# Patient Record
Sex: Male | Born: 1940
Health system: Southern US, Community
[De-identification: ages and names within clinical notes are randomized; demographics above are authoritative.]

## PROBLEM LIST (undated history)

## (undated) DIAGNOSIS — J683 Other acute and subacute respiratory conditions due to chemicals, gases, fumes and vapors: Secondary | ICD-10-CM

## (undated) DIAGNOSIS — F419 Anxiety disorder, unspecified: Secondary | ICD-10-CM

## (undated) DIAGNOSIS — K5792 Diverticulitis of intestine, part unspecified, without perforation or abscess without bleeding: Secondary | ICD-10-CM

## (undated) DIAGNOSIS — J019 Acute sinusitis, unspecified: Secondary | ICD-10-CM

## (undated) DIAGNOSIS — E7800 Pure hypercholesterolemia, unspecified: Secondary | ICD-10-CM

## (undated) DIAGNOSIS — J343 Hypertrophy of nasal turbinates: Secondary | ICD-10-CM

## (undated) DIAGNOSIS — I10 Essential (primary) hypertension: Secondary | ICD-10-CM

## (undated) DIAGNOSIS — E039 Hypothyroidism, unspecified: Secondary | ICD-10-CM

## (undated) HISTORY — DX: Other acute and subacute respiratory conditions due to chemicals, gases, fumes and vapors: J68.3

## (undated) HISTORY — PX: NASAL SINUS SURGERY: SHX719

## (undated) HISTORY — PX: BACK SURGERY: SHX140

---

## 2002-03-18 ENCOUNTER — Other Ambulatory Visit: Admission: RE | Admit: 2002-03-18 | Discharge: 2002-03-18 | Payer: Self-pay | Admitting: Orthopaedic Surgery

## 2002-08-07 ENCOUNTER — Other Ambulatory Visit: Admission: RE | Admit: 2002-08-07 | Discharge: 2002-08-07 | Payer: Self-pay | Admitting: Dermatology

## 2004-06-27 ENCOUNTER — Encounter: Admission: RE | Admit: 2004-06-27 | Discharge: 2004-06-27 | Payer: Self-pay | Admitting: Otolaryngology

## 2004-07-07 ENCOUNTER — Ambulatory Visit (HOSPITAL_BASED_OUTPATIENT_CLINIC_OR_DEPARTMENT_OTHER): Admission: RE | Admit: 2004-07-07 | Discharge: 2004-07-07 | Payer: Self-pay | Admitting: Otolaryngology

## 2004-07-07 ENCOUNTER — Ambulatory Visit (HOSPITAL_COMMUNITY): Admission: RE | Admit: 2004-07-07 | Discharge: 2004-07-07 | Payer: Self-pay | Admitting: Otolaryngology

## 2004-07-07 ENCOUNTER — Encounter (INDEPENDENT_AMBULATORY_CARE_PROVIDER_SITE_OTHER): Payer: Self-pay | Admitting: *Deleted

## 2004-10-27 ENCOUNTER — Ambulatory Visit (HOSPITAL_COMMUNITY): Admission: RE | Admit: 2004-10-27 | Discharge: 2004-10-27 | Payer: Self-pay | Admitting: Ophthalmology

## 2009-11-08 ENCOUNTER — Ambulatory Visit: Payer: Self-pay | Admitting: Orthopedic Surgery

## 2009-11-08 DIAGNOSIS — M5137 Other intervertebral disc degeneration, lumbosacral region: Secondary | ICD-10-CM | POA: Insufficient documentation

## 2009-11-08 DIAGNOSIS — M25559 Pain in unspecified hip: Secondary | ICD-10-CM | POA: Insufficient documentation

## 2009-11-08 DIAGNOSIS — M51379 Other intervertebral disc degeneration, lumbosacral region without mention of lumbar back pain or lower extremity pain: Secondary | ICD-10-CM | POA: Insufficient documentation

## 2009-11-08 DIAGNOSIS — M549 Dorsalgia, unspecified: Secondary | ICD-10-CM | POA: Insufficient documentation

## 2009-11-08 DIAGNOSIS — M5430 Sciatica, unspecified side: Secondary | ICD-10-CM | POA: Insufficient documentation

## 2009-11-16 ENCOUNTER — Telehealth: Payer: Self-pay | Admitting: Orthopedic Surgery

## 2009-11-17 ENCOUNTER — Ambulatory Visit (HOSPITAL_COMMUNITY): Admission: RE | Admit: 2009-11-17 | Discharge: 2009-11-17 | Payer: Self-pay | Admitting: Orthopedic Surgery

## 2009-11-17 ENCOUNTER — Encounter: Payer: Self-pay | Admitting: Orthopedic Surgery

## 2009-11-18 ENCOUNTER — Telehealth: Payer: Self-pay | Admitting: Orthopedic Surgery

## 2009-11-18 ENCOUNTER — Encounter (INDEPENDENT_AMBULATORY_CARE_PROVIDER_SITE_OTHER): Payer: Self-pay | Admitting: *Deleted

## 2009-12-01 ENCOUNTER — Ambulatory Visit: Payer: Self-pay | Admitting: Orthopedic Surgery

## 2009-12-08 ENCOUNTER — Encounter: Payer: Self-pay | Admitting: Orthopedic Surgery

## 2009-12-13 ENCOUNTER — Telehealth: Payer: Self-pay | Admitting: Orthopedic Surgery

## 2009-12-16 ENCOUNTER — Encounter: Payer: Self-pay | Admitting: Orthopedic Surgery

## 2009-12-22 ENCOUNTER — Inpatient Hospital Stay (HOSPITAL_COMMUNITY): Admission: RE | Admit: 2009-12-22 | Discharge: 2009-12-23 | Payer: Self-pay | Admitting: Neurosurgery

## 2010-01-03 ENCOUNTER — Encounter: Payer: Self-pay | Admitting: Orthopedic Surgery

## 2010-02-02 ENCOUNTER — Encounter: Payer: Self-pay | Admitting: Orthopedic Surgery

## 2010-03-23 ENCOUNTER — Encounter: Payer: Self-pay | Admitting: Orthopedic Surgery

## 2010-07-14 ENCOUNTER — Encounter: Payer: Self-pay | Admitting: Orthopedic Surgery

## 2010-12-20 NOTE — Progress Notes (Signed)
Summary: wants predisone prescription refilled  Phone Note Call from Patient   Summary of Call: Cameron Smith (07/12/2041) requested a refill on gabapentin and predisone this morning, the gabapentin was ok'd but not the predisone.  Patient wants to know if you will ok the predison  - Uses [facility] His # [phone] or [phone] Initial call taken by: Jacklynn Ganong,  December 13, 2009 2:31 PM  Follow-up for Phone Call        ok Follow-up by: Fuller Canada MD,  December 13, 2009 2:37 PM  Additional Follow-up for Phone Call Additional follow up Details #1::        Gave ok to refill Predisone to [facility] Additional Follow-up by: Jacklynn Ganong,  December 13, 2009 3:18 PM

## 2010-12-20 NOTE — Letter (Signed)
Summary: [facility] office note Dr Dessie Coma office note Dr Channing Mutters   Imported By: Cammie Sickle 08/02/2010 11:27:57  _____________________________________________________________________  External Attachment:    Type:   Image     Comment:   External Document

## 2010-12-20 NOTE — Letter (Signed)
Summary: [facility] rpt Dr Nickola Major  [facility] rpt Dr Nickola Major   Imported By: Cammie Sickle 12/16/2009 09:07:11  _____________________________________________________________________  External Attachment:    Type:   Image     Comment:   External Document

## 2010-12-20 NOTE — Letter (Signed)
Summary: [facility] office note Dr Dessie Coma office note Dr Channing Mutters   Imported By: Cammie Sickle 04/13/2010 12:18:18  _____________________________________________________________________  External Attachment:    Type:   Image     Comment:   External Document

## 2010-12-20 NOTE — Assessment & Plan Note (Signed)
Summary: 3 WK RE-CK BACK/MEDICARE,MUT OF O/CAF   Visit Type:  Follow-up  CC:  back pain .  History of Present Illness: I saw Cameron Smith in the office today for a followup visit.  He is a 70 years old man with the complaint of:    DX: Spinal stenosis.  MRI:   IMPRESSION: Left posterolateral disc herniation at L4-5 with left lateral recess stenosis quite likely to compress the left L5 nerve root at least.  There is also facet degeneration and ligamentous hypertrophy contributing to the stenosis at this level.   Shallow broad-based protrusion at L3-4 slightly more prominent towards the left.  Mild facet and ligamentous hypertrophy.  Mild narrowing of the lateral recesses and foramina, not definitely compressive.  Stenosis is slightly more pronounced on the left.   Disc degeneration L5-S1 with a small central disc herniation that does not appear to cause distinct neural compression.  Treatment: Prednisone pack, Vicodin 5 and Neurontin 100mg  three times a day. He is on the 2nd round of Prednisone. Shot in left hip did not help. The meds help Some, some days are better than others. HIs left foot IS tingling, and he has ankle pain lateral calf pain that goes up to back. Needs refill on Vicodin.  Went for [facility] the Wednesday after Christmas, the injection did not help much. 12/08/09 he has appt for 2nd injection.   Has appt with Dr. Channing Mutters in [place], Lynnell Dike was the contact person at [facility].  Today, scheduled for: 3 week recheck on back. We have faxed referral for patient to have L spine [facility] and Neurosurgeon referral has been faxed also.  MRI has been reviewed. The findings are as noted. I have gone over the findings with the patient with a model and pictures and I have given him the [employer] handout recommend repeat epidural injection.  Refill of Vicodin, increased, Neurontin 2 tablets every 8 hours follow up with me after neurosurgical appointment. If surgery is not  recommended  Allergies: No Known Drug Allergies  Review of Systems General:  no red flags Are there any red flags:  Numbness, tingling ? Bowel/bladder problem?  Saddle anesthesisa? Fever, Night sweats? . Neuro:  Complains of numbness and weakness. MS:  See HPI.   Medications Added to Medication List This Visit: 1)  Vicodin 5-500 Mg Tabs (Hydrocodone-acetaminophen) .Marland Kitchen.. 1 by mouth q4 as needed  Other Orders: Est. Patient Level III ([address])  Patient Instructions: 1)  Go for 2nd esi  2)  take medication as ordered  3)  rest  4)  Symptoms may go away but still go for Neurosurgery appt. 5)  return after neurosurgery appointment if they advise that you should not have surgery  6)  If they advise you that you need surgery, no followup needed here for the back or leg pain Prescriptions: VICODIN 5-500 MG TABS (HYDROCODONE-ACETAMINOPHEN) 1 by mouth q4 as needed  #84 x 2   Entered and Authorized by:   Fuller Canada MD   Signed by:   Fuller Canada MD on 12/01/2009   Method used:   Print then Give to Patient   RxID:   [id number]

## 2010-12-20 NOTE — Consult Note (Signed)
Summary: [facility] consult Dr Dessie Coma consult Dr Channing Mutters   Imported By: Cammie Sickle 03/12/2010 12:55:50  _____________________________________________________________________  External Attachment:    Type:   Image     Comment:   External Document

## 2010-12-20 NOTE — Letter (Signed)
Summary: [facility] office note Dr Dessie Coma office note Dr Channing Mutters   Imported By: Cammie Sickle 03/02/2010 08:58:14  _____________________________________________________________________  External Attachment:    Type:   Image     Comment:   External Document

## 2010-12-23 NOTE — Letter (Signed)
Summary: [facility] Office note Dr Dessie Coma Office note Dr Channing Mutters   Imported By: Cammie Sickle 03/02/2010 09:03:47  _____________________________________________________________________  External Attachment:    Type:   Image     Comment:   External Document

## 2011-02-06 LAB — COMPREHENSIVE METABOLIC PANEL
ALT: 29 U/L (ref 0–53)
AST: 21 U/L (ref 0–37)
Albumin: 3.8 g/dL (ref 3.5–5.2)
CO2: 31 mEq/L (ref 19–32)
Calcium: 9.7 mg/dL (ref 8.4–10.5)
GFR calc non Af Amer: >60 mL/min (ref >60)
Sodium: 139 mEq/L (ref 135–145)
Total Bilirubin: 1.3 mg/dL — ABNORMAL HIGH (ref 0.3–1.2)
Total Protein: 6 g/dL (ref 6.0–8.3)

## 2011-02-06 LAB — DIFFERENTIAL
Lymphocytes Relative: 10 % — ABNORMAL LOW (ref 12–46)
Lymphs Abs: 1.3 10*3/uL (ref 0.7–4.0)
Monocytes Absolute: 1.4 10*3/uL — ABNORMAL HIGH (ref 0.1–1.0)
Monocytes Relative: 10 % (ref 3–12)
Neutro Abs: 10.3 10*3/uL — ABNORMAL HIGH (ref 1.7–7.7)

## 2011-02-06 LAB — CBC
Platelets: 196 10*3/uL (ref 150–400)
WBC: 13 10*3/uL — ABNORMAL HIGH (ref 4.0–10.5)

## 2011-02-06 LAB — URINALYSIS, ROUTINE W REFLEX MICROSCOPIC
Bilirubin Urine: NEGATIVE
Glucose, UA: NEGATIVE mg/dL
Hgb urine dipstick: NEGATIVE
Nitrite: NEGATIVE
Specific Gravity, Urine: 1.011 (ref 1.005–1.030)
pH: 7 (ref 5.0–8.0)

## 2011-02-06 LAB — PROTIME-INR
INR: 0.92 (ref 0.00–1.49)
Prothrombin Time: 12.3 seconds (ref 11.6–15.2)

## 2011-02-09 LAB — POTASSIUM: Potassium: 5.3 mEq/L — ABNORMAL HIGH (ref 3.5–5.1)

## 2011-04-07 NOTE — Op Note (Signed)
NAME:  Cameron Smith, Cameron Smith                       ACCOUNT NO.:  [id number]   MEDICAL RECORD NO.:  [id number]                   PATIENT TYPE:  AMB   LOCATION:  DSC                                  FACILITY:  [facility]   PHYSICIAN:  Suzanna Obey, M.D.                    DATE OF BIRTH:  [birth year]   DATE OF PROCEDURE:  DATE OF DISCHARGE:                                 OPERATIVE REPORT   PREOPERATIVE DIAGNOSIS:  Chronic sinusitis and deviated septum.   POSTOPERATIVE DIAGNOSIS:  Chronic sinusitis and deviated septum.   PROCEDURE:  Bilateral maxillary antrostomy, bilateral ethmoidectomy,  bilateral frontal sinusotomy, septoplasty, Instratek computer guidance.   ANESTHESIA:  General endotracheal tube anesthesia .   SURGEON:   ESTIMATED BLOOD LOSS:  Approximately 25 mL.   INDICATIONS FOR PROCEDURE:  This is a 70 year old who has had chronic  sinusitis problems.  He has decrease in sense of smell.  He has been treated  with  broad spectrum antibiotics  and failed to resolve with CT scan  findings of chronic sinusitis.  He has a severe deviation of his septum to  the right.  He was informed of risks and benefits of the procedure including  bleeding, infection, scarring of the sinuses, chronic crusting and drying,  CSF leak, change in sense of smell, blindness, and risk of the anesthetic.  Also he understands change in external appearance of the nose and septal  perforation is possible as well.  Consent was obtained.   DESCRIPTION OF PROCEDURE:  The patient taken to the operating room and  placed in the supine position.  After adequate general endotracheal tube  anesthesia , was prepped and draped in the usual sterile manner.  The  Instratek helmet was positioned and calibrated.  The oxymetazoline pledgets  were placed in the nose bilaterally in the septum.  Middle turbinates were  injected with 1% lidocaine with 1:100,000 epinephrine.  The right  hemitransfixion incision was performed raising  the mucoperichondrial and  ostial flap.  Cartilage was divided about 1.5 cm posterior to the caudal  strut and this cartilage was removed.  The cartilage was then elevated.  Flaps on both sides and the Jansen-Middleton forceps were used to remove the  deviated portion of the cartilage and bone which was severely deviated.  This opened up the nose nicely.  The middle turbinate on the right side  could then be visualized which it could not prior to this.  This turbinate  was injected with 1% width.  Then the sinus operation was begun on the left  side with an uncinectomy was performed.  The tissue was very polypoid and  microdebrided away.  Irving Copas was used to identify the maxillary area and  opened widely with backbiting forceps, straight biters and the  microdebrider.  The ethmoid was then opened.  Guidance used with the  [facility] and opened from posterior to anterior.  There  was thickened  polypoid material throughout the sinus cavity.  The nasal frontal duct was  opened with the upbiting 40 degree debrider and the nasal frontal duct was  identified and the polypoid material was removed.  Pledget was placed.  The  right side was repeated in the same fashion, again very thick polypoid  material was in the ethmoid and maxillary region.  The antrostomy was opened  widely.  The ethmoid was opened from posterior to anterior, removing all the  thick polypoid material.  Nasal frontal duct was opened as well with a 40  degree debrider.  Irving Copas was used.  The septum was then closed with  interrupted 4-0 chromic, a 4-0 plain gut  quilting stitch placed to the septum.  Kennedy packs soaked in Bacitracin  and placed into the nose bilaterally and secured loosely with the ties  across the columella.  Oral cavity and oropharynx suctioned of all blood and  debris and the patient was then awakened and brought to recovery in stable  condition.  Counts correct.                                                Suzanna Obey, M.D.    Cordelia Pen  D:  07/07/2004  T:  07/07/2004  Job:  [id number]   cc:   Dr. Juventino Slovak, [place]

## 2011-09-06 ENCOUNTER — Telehealth: Payer: Self-pay | Admitting: Internal Medicine

## 2011-09-06 NOTE — Telephone Encounter (Signed)
LMOM for Bonita Quin to return my call.

## 2011-09-06 NOTE — Telephone Encounter (Signed)
Dayvin Aber (wife of pt) called and wants to set up Mr [facility] colonoscopy per Dr Gerda Diss. I told her that I would have the triage nurse give them a call to have that arranged. Their number is [phone]

## 2011-09-07 NOTE — Telephone Encounter (Signed)
Called and spoke with pt's wife. Pt had colonoscopy in 09/2002 by Dr. Linna Darner in [place]. Doesn't think he had polyps. He's not having any problems now. Will check with [facility] for copy of that procedure.

## 2011-09-12 NOTE — Telephone Encounter (Signed)
LMOM for pt to call. 

## 2011-09-13 NOTE — Telephone Encounter (Signed)
LMOM to call.

## 2011-09-18 NOTE — Telephone Encounter (Signed)
Mailing letter to pt to call. OK to triage. Colonoscopy was done 11/2001. Will schedule if he calls back to be triaged.

## 2011-10-03 ENCOUNTER — Telehealth: Payer: Self-pay

## 2011-10-03 ENCOUNTER — Other Ambulatory Visit: Payer: Self-pay

## 2011-10-03 DIAGNOSIS — Z139 Encounter for screening, unspecified: Secondary | ICD-10-CM

## 2011-10-03 NOTE — Telephone Encounter (Signed)
OK to proceed with colonoscopy.

## 2011-10-03 NOTE — Telephone Encounter (Signed)
Gastroenterology Pre-Procedure Form   Request Date: 10/03/2011       Requesting Physician: Dr. Simone Curia     PATIENT INFORMATION:  Cameron Smith is a 70 y.o., male (DOB=09/15/1941).  PROCEDURE: Procedure(s) requested: colonoscopy Procedure Reason: screening for colon cancer  PATIENT REVIEW QUESTIONS: The patient reports the following:   1. Diabetes Melitis: no 2. Joint replacements in the past 12 months: no 3. Major health problems in the past 3 months: no 4. Has an artificial valve or MVP:no 5. Has been advised in past to take antibiotics in advance of a procedure like teeth cleaning: no}    MEDICATIONS & ALLERGIES:    Patient reports the following regarding taking any blood thinners:   Plavix? no Aspirin?yes  Coumadin?  no  Patient confirms/reports the following medications:  Current Outpatient Prescriptions  Medication Sig Dispense Refill  . aspirin 81 MG tablet Take 81 mg by mouth daily.        Marland Kitchen atorvastatin (LIPITOR) 20 MG tablet Take 20 mg by mouth daily.        . cloNIDine (CATAPRES) 0.2 MG tablet Take 0.2 mg by mouth 1 day or 1 dose.        . levothyroxine (SYNTHROID, LEVOTHROID) 88 MCG tablet Take 88 mcg by mouth daily.          Patient confirms/reports the following allergies:  No Known Allergies  Patient is appropriate to schedule for requested procedure(s): yes  AUTHORIZATION INFORMATION Primary Insurance:   ID #:  Group #:  Pre-Cert / Auth required: Pre-Cert / Auth #:   Secondary Insurance:  ID #:   Group #:  Pre-Cert / Auth required: Pre-Cert / Auth #:   No orders of the defined types were placed in this encounter.    SCHEDULE INFORMATION: Procedure has been scheduled as follows:  Date: 10/31/2011        Time: 10:30 AM  Location: [facility] Short Stay  This Gastroenterology Pre-Precedure Form is being routed to the following provider(s) for review: R. Roetta Sessions, MD

## 2011-10-04 NOTE — Telephone Encounter (Signed)
Rx and instructions mailed.  

## 2011-10-23 ENCOUNTER — Encounter (HOSPITAL_COMMUNITY): Payer: Self-pay | Admitting: Pharmacy Technician

## 2011-10-30 MED ORDER — SODIUM CHLORIDE 0.45 % IV SOLN
Freq: Once | INTRAVENOUS | Status: AC
  Administered 2011-10-31: 10:00:00 via INTRAVENOUS

## 2011-10-31 ENCOUNTER — Ambulatory Visit (HOSPITAL_COMMUNITY)
Admission: RE | Admit: 2011-10-31 | Discharge: 2011-10-31 | Disposition: A | Discharge status: Home / Self Care | Payer: Medicare Other | Source: Ambulatory Visit | Attending: Internal Medicine | Admitting: Internal Medicine

## 2011-10-31 ENCOUNTER — Encounter (HOSPITAL_COMMUNITY): Payer: Self-pay | Admitting: *Deleted

## 2011-10-31 ENCOUNTER — Encounter (HOSPITAL_COMMUNITY)
Admission: RE | Disposition: A | Discharge status: Home / Self Care | Payer: Self-pay | Source: Ambulatory Visit | Attending: Internal Medicine

## 2011-10-31 DIAGNOSIS — K5730 Diverticulosis of large intestine without perforation or abscess without bleeding: Secondary | ICD-10-CM

## 2011-10-31 DIAGNOSIS — Z1211 Encounter for screening for malignant neoplasm of colon: Secondary | ICD-10-CM

## 2011-10-31 DIAGNOSIS — Z139 Encounter for screening, unspecified: Secondary | ICD-10-CM

## 2011-10-31 HISTORY — DX: Anxiety disorder, unspecified: F41.9

## 2011-10-31 HISTORY — PX: COLONOSCOPY: SHX5424

## 2011-10-31 HISTORY — DX: Pure hypercholesterolemia, unspecified: E78.00

## 2011-10-31 HISTORY — DX: Essential (primary) hypertension: I10

## 2011-10-31 HISTORY — DX: Hypothyroidism, unspecified: E03.9

## 2011-10-31 SURGERY — COLONOSCOPY
Anesthesia: Moderate Sedation

## 2011-10-31 MED ORDER — MIDAZOLAM HCL 5 MG/5ML IJ SOLN
INTRAMUSCULAR | Status: DC
Start: 2011-10-31 — End: 2011-10-31
  Filled 2011-10-31: qty 10

## 2011-10-31 MED ORDER — MIDAZOLAM HCL 5 MG/5ML IJ SOLN
INTRAMUSCULAR | Status: DC | PRN
  Administered 2011-10-31: 2 mg via INTRAVENOUS
  Administered 2011-10-31 (×2): 1 mg via INTRAVENOUS

## 2011-10-31 MED ORDER — MEPERIDINE HCL 100 MG/ML IJ SOLN
INTRAMUSCULAR | Status: DC | PRN
  Administered 2011-10-31: 25 mg via INTRAVENOUS
  Administered 2011-10-31: 50 mg via INTRAVENOUS
  Administered 2011-10-31: 25 mg via INTRAVENOUS

## 2011-10-31 MED ORDER — MEPERIDINE HCL 100 MG/ML IJ SOLN
INTRAMUSCULAR | Status: AC
  Filled 2011-10-31: qty 2

## 2011-10-31 MED ORDER — STERILE WATER FOR IRRIGATION IR SOLN
Status: DC | PRN
  Administered 2011-10-31: 11:00:00

## 2011-10-31 NOTE — H&P (Signed)
Primary Care Physician:  Dr. Minette Headland   HPI:  Cameron Smith is a 70 y.o. male is here for a screening colonoscopy. Reportedly had a colonoscopy 10 years ago. Here for average risk screening. No bowel symptoms. No family history of polyps or colon cancer.  Past Medical History  Diagnosis Date  . Hypertension   . Hypercholesteremia   . Hypothyroidism   . Anxiety     Past Surgical History  Procedure Date  . Back surgery   . Nasal sinus surgery     Prior to Admission medications   Medication Sig Start Date End Date Taking? Authorizing Provider  aspirin 81 MG tablet Take 81 mg by mouth daily.     Historical Provider, MD  Aspirin-Salicylamide-Caffeine (BC HEADACHE POWDER PO) Take 1 packet by mouth daily as needed. Pain     Historical Provider, MD  atorvastatin (LIPITOR) 20 MG tablet Take 20 mg by mouth daily.      Historical Provider, MD  beta carotene 09811 UNIT capsule Take 25,000 Units by mouth daily.      Historical Provider, MD  cloNIDine (CATAPRES) 0.2 MG tablet Take 0.2 mg by mouth daily.     Historical Provider, MD  levothyroxine (SYNTHROID, LEVOTHROID) 88 MCG tablet Take 88 mcg by mouth daily.      Historical Provider, MD  lisinopril (PRINIVIL,ZESTRIL) 10 MG tablet Take 10 mg by mouth daily.      Historical Provider, MD  niacin (SLO-NIACIN) 500 MG tablet Take 500 mg by mouth daily.      Historical Provider, MD  vitamin E 400 UNIT capsule Take 400 Units by mouth daily.      Historical Provider, MD    Allergies as of 10/03/2011  . (No Known Allergies)    Family History  Problem Relation Age of Onset  . Colon cancer Neg Hx     History   Social History  . Marital Status: Married    Spouse Name: N/A    Number of Children: N/A  . Years of Education: N/A   Occupational History  . Not on file.   Social History Main Topics  . Smoking status: Former Smoker -- 3.0 packs/day for 20 years  . Smokeless tobacco: Not on file  . Alcohol Use: No  . Drug Use: No  .  Sexually Active:    Other Topics Concern  . Not on file   Social History Narrative  . No narrative on file    Review of Systems: See HPI, otherwise negative ROS  Physical Exam: BP 185/100  Pulse 84  Temp(Src) 97.7 F (36.5 C) (Oral)  Resp 24  Ht 5' 8.5" (1.74 m)  Wt 185 lb (83.915 kg)  BMI 27.72 kg/m2  SpO2 93% General:   Alert,  Well-developed, well-nourished, pleasant and cooperative in NAD Head:  Normocephalic and atraumatic. Eyes:  Sclera clear, no icterus.   Conjunctiva pink. Ears:  Normal auditory acuity. Nose:  No deformity, discharge,  or lesions. Mouth:  No deformity or lesions, dentition normal. Neck:  Supple; no masses or thyromegaly. Lungs:  Clear throughout to auscultation.   No wheezes, crackles, or rhonchi. No acute distress. Heart:  Regular rate and rhythm; no murmurs, clicks, rubs,  or gallops. Abdomen:  Nondistended positive bowel sounds soft nontender without appreciable mass or organomegaly  Msk:  Symmetrical without gross deformities. Normal posture. Pulses:  Normal pulses noted. Extremities:  Without clubbing or edema. Neurologic:  Alert and  oriented x4;  grossly normal neurologically. Skin:  Intact without significant lesions or rashes. Cervical Nodes:  No significant cervical adenopathy. Psych:  Alert and cooperative. Normal mood and affect.  Impression/Plan: Cameron Smith is now here to undergo a screening colonoscopy.  The risks, benefits, limitations, imponderables and alternatives regarding colonoscopy have been reviewed with the patient. Questions have been answered. All parties agreeable.

## 2011-10-31 NOTE — Op Note (Signed)
[facility] [address] [place], [place]  [address]  COLONOSCOPY PROCEDURE REPORT  PATIENT:  Cameron Smith, Cameron Smith  MR#:  [id number] BIRTHDATE:  Mar 07, 1941, 70 yrs. old  GENDER:  male ENDOSCOPIST:  R. Roetta Sessions, MD FACP [facility] REF. BY:           Dr. Minette Headland PROCEDURE DATE:  10/31/2011  PROCEDURE:    Screening colonoscopy  INDICATIONS:     Average risk screening -last exam approximately 10 years ago  INFORMED CONSENT:  The risks, benefits, alternatives and imponderables including but not limited to bleeding, perforation as well as the possibility of a missed lesion have been reviewed. The potential for biopsy, lesion removal, etc. have also been discussed.  Questions have been answered.  All parties agreeable. Please see the history and physical in the medical record for more information.  MEDICATIONS:   Versed 4 mg IV and Demerol 100 mg IV in divided doses  DESCRIPTION OF PROCEDURE:  After a digital rectal exam was performed, the EC-[id number] ([id number]) colonoscope was advanced from the anus through the rectum and colon to the area of the cecum, ileocecal valve and appendiceal orifice.  The cecum was deeply intubated.  These structures were well-seen and photographed for the record.  From the level of the cecum and ileocecal valve, the scope was slowly and cautiously withdrawn.  The mucosal surfaces were carefully surveyed utilizing scope tip deflection to facilitate fold flattening as needed.  The scope was pulled down into the rectum where a thorough examination including retroflexion was performed. <<PROCEDUREIMAGES>>  FINDINGS:  adequate preparation. Extensive left-sided diverticula; remainder of colonic and rectal mucosa appeared normal.  THERAPEUTIC / DIAGNOSTIC MANEUVERS PERFORMED:    none  COMPLICATIONS:   none  CECAL WITHDRAWAL TIME:     8 minutes  IMPRESSION:    Colonic diverticulosis.  RECOMMENDATIONS:      Consider one more screening colonoscopy  in 10 years if overall health permits  ______________________________ R. Roetta Sessions, MD Caleen Essex  CC:  Simone Curia, Md  n. eSIGNED:   R. Roetta Sessions at 10/31/2011 11:11 AM  Felton Clinton, [phone]

## 2011-11-08 ENCOUNTER — Encounter (HOSPITAL_COMMUNITY): Payer: Self-pay | Admitting: Internal Medicine

## 2012-01-03 DIAGNOSIS — E782 Mixed hyperlipidemia: Secondary | ICD-10-CM | POA: Diagnosis not present

## 2012-01-03 DIAGNOSIS — E119 Type 2 diabetes mellitus without complications: Secondary | ICD-10-CM | POA: Diagnosis not present

## 2012-01-03 DIAGNOSIS — Z125 Encounter for screening for malignant neoplasm of prostate: Secondary | ICD-10-CM | POA: Diagnosis not present

## 2012-01-03 DIAGNOSIS — Z79899 Other long term (current) drug therapy: Secondary | ICD-10-CM | POA: Diagnosis not present

## 2012-01-11 DIAGNOSIS — I10 Essential (primary) hypertension: Secondary | ICD-10-CM | POA: Diagnosis not present

## 2012-01-11 DIAGNOSIS — E119 Type 2 diabetes mellitus without complications: Secondary | ICD-10-CM | POA: Diagnosis not present

## 2012-01-11 DIAGNOSIS — E785 Hyperlipidemia, unspecified: Secondary | ICD-10-CM | POA: Diagnosis not present

## 2012-07-12 DIAGNOSIS — Z79899 Other long term (current) drug therapy: Secondary | ICD-10-CM | POA: Diagnosis not present

## 2012-07-12 DIAGNOSIS — R7301 Impaired fasting glucose: Secondary | ICD-10-CM | POA: Diagnosis not present

## 2012-07-12 DIAGNOSIS — E782 Mixed hyperlipidemia: Secondary | ICD-10-CM | POA: Diagnosis not present

## 2012-07-24 DIAGNOSIS — I10 Essential (primary) hypertension: Secondary | ICD-10-CM | POA: Diagnosis not present

## 2012-07-24 DIAGNOSIS — E785 Hyperlipidemia, unspecified: Secondary | ICD-10-CM | POA: Diagnosis not present

## 2012-07-24 DIAGNOSIS — E119 Type 2 diabetes mellitus without complications: Secondary | ICD-10-CM | POA: Diagnosis not present

## 2012-08-21 ENCOUNTER — Other Ambulatory Visit: Payer: Self-pay | Admitting: Dermatology

## 2012-08-21 DIAGNOSIS — D485 Neoplasm of uncertain behavior of skin: Secondary | ICD-10-CM | POA: Diagnosis not present

## 2012-08-21 DIAGNOSIS — L570 Actinic keratosis: Secondary | ICD-10-CM | POA: Diagnosis not present

## 2012-08-28 DIAGNOSIS — Z23 Encounter for immunization: Secondary | ICD-10-CM | POA: Diagnosis not present

## 2012-09-09 DIAGNOSIS — Z23 Encounter for immunization: Secondary | ICD-10-CM | POA: Diagnosis not present

## 2012-09-09 DIAGNOSIS — Z0000 Encounter for general adult medical examination without abnormal findings: Secondary | ICD-10-CM | POA: Diagnosis not present

## 2012-12-30 DIAGNOSIS — I10 Essential (primary) hypertension: Secondary | ICD-10-CM | POA: Diagnosis not present

## 2012-12-30 DIAGNOSIS — M719 Bursopathy, unspecified: Secondary | ICD-10-CM | POA: Diagnosis not present

## 2012-12-30 DIAGNOSIS — M25519 Pain in unspecified shoulder: Secondary | ICD-10-CM | POA: Diagnosis not present

## 2012-12-30 DIAGNOSIS — M67919 Unspecified disorder of synovium and tendon, unspecified shoulder: Secondary | ICD-10-CM | POA: Diagnosis not present

## 2013-01-07 DIAGNOSIS — Z79899 Other long term (current) drug therapy: Secondary | ICD-10-CM | POA: Diagnosis not present

## 2013-01-07 DIAGNOSIS — E119 Type 2 diabetes mellitus without complications: Secondary | ICD-10-CM | POA: Diagnosis not present

## 2013-01-07 DIAGNOSIS — E039 Hypothyroidism, unspecified: Secondary | ICD-10-CM | POA: Diagnosis not present

## 2013-01-07 DIAGNOSIS — E785 Hyperlipidemia, unspecified: Secondary | ICD-10-CM | POA: Diagnosis not present

## 2013-01-07 DIAGNOSIS — Z125 Encounter for screening for malignant neoplasm of prostate: Secondary | ICD-10-CM | POA: Diagnosis not present

## 2013-01-07 DIAGNOSIS — I10 Essential (primary) hypertension: Secondary | ICD-10-CM | POA: Diagnosis not present

## 2013-06-12 ENCOUNTER — Telehealth: Payer: Self-pay | Admitting: Family Medicine

## 2013-06-12 DIAGNOSIS — E782 Mixed hyperlipidemia: Secondary | ICD-10-CM

## 2013-06-12 DIAGNOSIS — Z79899 Other long term (current) drug therapy: Secondary | ICD-10-CM

## 2013-06-12 NOTE — Telephone Encounter (Signed)
Lip liv plus ov

## 2013-06-12 NOTE — Telephone Encounter (Signed)
Pt needs BW papers please

## 2013-06-13 DIAGNOSIS — E782 Mixed hyperlipidemia: Secondary | ICD-10-CM | POA: Diagnosis not present

## 2013-06-13 DIAGNOSIS — Z79899 Other long term (current) drug therapy: Secondary | ICD-10-CM | POA: Diagnosis not present

## 2013-06-13 NOTE — Telephone Encounter (Signed)
bw papers ready for pick up. Pt's wife notified

## 2013-06-14 ENCOUNTER — Encounter: Payer: Self-pay | Admitting: *Deleted

## 2013-07-01 DIAGNOSIS — E782 Mixed hyperlipidemia: Secondary | ICD-10-CM | POA: Diagnosis not present

## 2013-07-01 DIAGNOSIS — Z79899 Other long term (current) drug therapy: Secondary | ICD-10-CM | POA: Diagnosis not present

## 2013-07-01 LAB — LIPID PANEL
Cholesterol: 172 mg/dL (ref 0–200)
Total CHOL/HDL Ratio: 4.2 Ratio
Triglycerides: 97 mg/dL (ref <150)
VLDL: 19 mg/dL (ref 0–40)

## 2013-07-01 LAB — HEPATIC FUNCTION PANEL
ALT: 18 U/L (ref 0–53)
Total Protein: 6.3 g/dL (ref 6.0–8.3)

## 2013-07-02 DIAGNOSIS — L570 Actinic keratosis: Secondary | ICD-10-CM | POA: Diagnosis not present

## 2013-07-02 DIAGNOSIS — D239 Other benign neoplasm of skin, unspecified: Secondary | ICD-10-CM | POA: Diagnosis not present

## 2013-07-08 ENCOUNTER — Ambulatory Visit (INDEPENDENT_AMBULATORY_CARE_PROVIDER_SITE_OTHER): Payer: Medicare Other | Admitting: Family Medicine

## 2013-07-08 ENCOUNTER — Encounter: Payer: Self-pay | Admitting: Family Medicine

## 2013-07-08 VITALS — BP 138/90 | Ht 68.0 in | Wt 196.6 lb

## 2013-07-08 DIAGNOSIS — R7301 Impaired fasting glucose: Secondary | ICD-10-CM

## 2013-07-08 DIAGNOSIS — E782 Mixed hyperlipidemia: Secondary | ICD-10-CM | POA: Insufficient documentation

## 2013-07-08 DIAGNOSIS — E039 Hypothyroidism, unspecified: Secondary | ICD-10-CM

## 2013-07-08 DIAGNOSIS — I10 Essential (primary) hypertension: Secondary | ICD-10-CM

## 2013-07-08 DIAGNOSIS — E785 Hyperlipidemia, unspecified: Secondary | ICD-10-CM | POA: Insufficient documentation

## 2013-07-08 MED ORDER — LISINOPRIL 20 MG PO TABS: 20.0000 mg | ORAL_TABLET | Freq: Every day | ORAL | Status: DC

## 2013-07-08 MED ORDER — LEVOTHYROXINE SODIUM 88 MCG PO TABS: 88.0000 ug | ORAL_TABLET | Freq: Every day | ORAL | Status: DC

## 2013-07-08 MED ORDER — ATORVASTATIN CALCIUM 20 MG PO TABS: 20.0000 mg | ORAL_TABLET | Freq: Every day | ORAL | Status: DC

## 2013-07-08 NOTE — Addendum Note (Signed)
Addended by: Dereck Ligas on: 07/08/2013 09:59 AM   Modules accepted: Orders

## 2013-07-08 NOTE — Progress Notes (Signed)
  Subjective:    Patient ID: Cameron Smith, male    DOB: 1941-03-31, 72 y.o.   MRN: [id number]  HPI Results for orders placed in visit on 06/12/13  LIPID PANEL      Result Value Range   Cholesterol 172  0 - 200 mg/dL   Triglycerides 97  <409 mg/dL   HDL 41  >81 mg/dL   Total CHOL/HDL Ratio 4.2     VLDL 19  0 - 40 mg/dL   LDL Cholesterol 191 (*) 0 - 99 mg/dL  HEPATIC FUNCTION PANEL      Result Value Range   Total Bilirubin 0.7  0.3 - 1.2 mg/dL   Bilirubin, Direct 0.1  0.0 - 0.3 mg/dL   Indirect Bilirubin 0.6  0.0 - 0.9 mg/dL   Alkaline Phosphatase 53  39 - 117 U/L   AST 16  0 - 37 U/L   ALT 18  0 - 53 U/L   Total Protein 6.3  6.0 - 8.3 g/dL   Albumin 4.3  3.5 - 5.2 g/dL   Patient claims compliance with all of his medications. Review of his medicines reveals that he is taking both over-the-counter niacin and vitamin E. supplementation.  Compliant with his cholesterol medicine. Tolerating it well. Mostly watching his diet.  Compliant with thyroid medicine. No symptoms of hypo-or hyperthyroidism.  Compliant with blood pressure medicine. No headache no chest pain. Trying to watch his salt intake.   Review of Systems ROS otherwise negative    Objective:   Physical Exam  Alert no acute distress. Lungs clear. Heart regular rate and rhythm. HEENT normal. Vitals reviewed. Ankles without edema.      Assessment & Plan:  Impression 1 hypertension good control discussed. #2 hyperlipidemia blood work shows good control discussed. #3 chronic over-the-counter supplements. Patient does not realize it, but he is on a couple supplements which have been shown to be negative and there overall affect discussed at length. #4 history of glucose intolerance discussed. Plan encouraged to stop niacin, and stop vitamin E. Maintain other medicines. Diet exercise discussed. Medications called in. Recheck in 6 months. WSL

## 2013-09-11 DIAGNOSIS — Z23 Encounter for immunization: Secondary | ICD-10-CM | POA: Diagnosis not present

## 2013-11-22 ENCOUNTER — Emergency Department (HOSPITAL_COMMUNITY)
Admission: EM | Admit: 2013-11-22 | Discharge: 2013-11-22 | Disposition: A | Discharge status: Home / Self Care | Payer: Medicare Other | Attending: Emergency Medicine | Admitting: Emergency Medicine

## 2013-11-22 ENCOUNTER — Emergency Department (HOSPITAL_COMMUNITY): Payer: Medicare Other

## 2013-11-22 ENCOUNTER — Encounter (HOSPITAL_COMMUNITY): Payer: Self-pay | Admitting: Emergency Medicine

## 2013-11-22 DIAGNOSIS — Z9889 Other specified postprocedural states: Secondary | ICD-10-CM | POA: Diagnosis not present

## 2013-11-22 DIAGNOSIS — K5732 Diverticulitis of large intestine without perforation or abscess without bleeding: Secondary | ICD-10-CM | POA: Insufficient documentation

## 2013-11-22 DIAGNOSIS — E7800 Pure hypercholesterolemia, unspecified: Secondary | ICD-10-CM | POA: Insufficient documentation

## 2013-11-22 DIAGNOSIS — R1032 Left lower quadrant pain: Secondary | ICD-10-CM | POA: Diagnosis not present

## 2013-11-22 DIAGNOSIS — E039 Hypothyroidism, unspecified: Secondary | ICD-10-CM | POA: Insufficient documentation

## 2013-11-22 DIAGNOSIS — I10 Essential (primary) hypertension: Secondary | ICD-10-CM | POA: Insufficient documentation

## 2013-11-22 DIAGNOSIS — Z7982 Long term (current) use of aspirin: Secondary | ICD-10-CM | POA: Insufficient documentation

## 2013-11-22 DIAGNOSIS — Z87891 Personal history of nicotine dependence: Secondary | ICD-10-CM | POA: Insufficient documentation

## 2013-11-22 DIAGNOSIS — F411 Generalized anxiety disorder: Secondary | ICD-10-CM | POA: Diagnosis not present

## 2013-11-22 DIAGNOSIS — Z792 Long term (current) use of antibiotics: Secondary | ICD-10-CM | POA: Insufficient documentation

## 2013-11-22 DIAGNOSIS — Z79899 Other long term (current) drug therapy: Secondary | ICD-10-CM | POA: Insufficient documentation

## 2013-11-22 DIAGNOSIS — K5792 Diverticulitis of intestine, part unspecified, without perforation or abscess without bleeding: Secondary | ICD-10-CM

## 2013-11-22 DIAGNOSIS — J45909 Unspecified asthma, uncomplicated: Secondary | ICD-10-CM | POA: Diagnosis not present

## 2013-11-22 LAB — CBC WITH DIFFERENTIAL/PLATELET
BASOS PCT: 0 % (ref 0–1)
Basophils Absolute: 0 10*3/uL (ref 0.0–0.1)
EOS PCT: 3 % (ref 0–5)
Eosinophils Absolute: 0.3 10*3/uL (ref 0.0–0.7)
HEMATOCRIT: 45.1 % (ref 39.0–52.0)
HEMOGLOBIN: 15.7 g/dL (ref 13.0–17.0)
LYMPHS PCT: 15 % (ref 12–46)
Lymphs Abs: 1.5 10*3/uL (ref 0.7–4.0)
MCH: 33.2 pg (ref 26.0–34.0)
MCHC: 34.8 g/dL (ref 30.0–36.0)
MCV: 95.3 fL (ref 78.0–100.0)
MONO ABS: 1.2 10*3/uL — AB (ref 0.1–1.0)
MONOS PCT: 12 % (ref 3–12)
NEUTROS ABS: 7.2 10*3/uL (ref 1.7–7.7)
Neutrophils Relative %: 70 % (ref 43–77)
Platelets: 222 10*3/uL (ref 150–400)
RBC: 4.73 MIL/uL (ref 4.22–5.81)
RDW: 12.7 % (ref 11.5–15.5)
WBC: 10.2 10*3/uL (ref 4.0–10.5)

## 2013-11-22 LAB — URINALYSIS, ROUTINE W REFLEX MICROSCOPIC
BILIRUBIN URINE: NEGATIVE
GLUCOSE, UA: NEGATIVE mg/dL
KETONES UR: NEGATIVE mg/dL
Leukocytes, UA: NEGATIVE
Nitrite: NEGATIVE
PH: 6 (ref 5.0–8.0)
Protein, ur: NEGATIVE mg/dL
Specific Gravity, Urine: <1.005 — ABNORMAL LOW (ref 1.005–1.030)
Urobilinogen, UA: 0.2 mg/dL (ref 0.0–1.0)

## 2013-11-22 LAB — URINE MICROSCOPIC-ADD ON

## 2013-11-22 LAB — BASIC METABOLIC PANEL
BUN: 13 mg/dL (ref 6–23)
CALCIUM: 9.6 mg/dL (ref 8.4–10.5)
CHLORIDE: 102 meq/L (ref 96–112)
CO2: 26 meq/L (ref 19–32)
CREATININE: 0.73 mg/dL (ref 0.50–1.35)
GFR calc Af Amer: >90 mL/min (ref >90)
GFR calc non Af Amer: >90 mL/min (ref >90)
Glucose, Bld: 129 mg/dL — ABNORMAL HIGH (ref 70–99)
Potassium: 4.6 mEq/L (ref 3.7–5.3)
Sodium: 140 mEq/L (ref 137–147)

## 2013-11-22 MED ORDER — POLYETHYLENE GLYCOL 3350 17 G PO PACK
17.0000 g | PACK | Freq: Every day | ORAL | Status: DC
Start: 2013-11-22 — End: 2014-08-04

## 2013-11-22 MED ORDER — IOHEXOL 300 MG/ML  SOLN
50.0000 mL | Freq: Once | INTRAMUSCULAR | Status: AC | PRN
  Administered 2013-11-22: 50 mL via ORAL

## 2013-11-22 MED ORDER — IOHEXOL 300 MG/ML  SOLN
100.0000 mL | Freq: Once | INTRAMUSCULAR | Status: AC | PRN
Start: 2013-11-22 — End: 2013-11-22
  Administered 2013-11-22: 100 mL via INTRAVENOUS

## 2013-11-22 MED ORDER — CIPROFLOXACIN HCL 750 MG PO TABS: 750.0000 mg | ORAL_TABLET | Freq: Two times a day (BID) | ORAL | Status: DC

## 2013-11-22 MED ORDER — ONDANSETRON 8 MG PO TBDP: 8.0000 mg | ORAL_TABLET | Freq: Three times a day (TID) | ORAL | Status: DC | PRN

## 2013-11-22 MED ORDER — SODIUM CHLORIDE 0.9 % IV BOLUS (SEPSIS)
500.0000 mL | Freq: Once | INTRAVENOUS | Status: AC
  Administered 2013-11-22: 500 mL via INTRAVENOUS

## 2013-11-22 MED ORDER — METRONIDAZOLE 500 MG PO TABS: 500.0000 mg | ORAL_TABLET | Freq: Three times a day (TID) | ORAL | Status: DC

## 2013-11-22 MED ORDER — OXYCODONE-ACETAMINOPHEN 5-325 MG PO TABS: 1.0000 | ORAL_TABLET | Freq: Four times a day (QID) | ORAL | Status: DC | PRN

## 2013-11-22 NOTE — ED Provider Notes (Signed)
CSN: [id number]     Arrival date & time 11/22/13  1151 History   First MD Initiated Contact with Patient 11/22/13 1300     Chief Complaint  Patient presents with  . Abdominal Pain   (Consider location/radiation/quality/duration/timing/severity/associated sxs/prior Treatment) Patient is a 73 y.o. male presenting with abdominal pain. The history is provided by the patient.  Abdominal Pain Associated symptoms: no chest pain, no diarrhea, no nausea, no shortness of breath and no vomiting    patient's had lower abdominal pain since for the morning today. He states it woke about. No nausea vomiting. No diarrhea constipation. He states the pain eased off a little bit but has come back. No fevers. He states he ate breakfast and it does not change the pain. He's not had pain like this before. No fevers. The pain is dull.  Past Medical History  Diagnosis Date  . Hypertension   . Hypercholesteremia   . Hypothyroidism   . Anxiety   . Reactive airways dysfunction syndrome    Past Surgical History  Procedure Laterality Date  . Back surgery    . Nasal sinus surgery    . Colonoscopy  10/31/2011    Procedure: COLONOSCOPY;  Surgeon: Daneil Dolin, MD;  Location: [facility];  Service: Endoscopy;  Laterality: N/A;  10:30 AM   Family History  Problem Relation Age of Onset  . Colon cancer Neg Hx   . Heart disease Father   . Diabetes Sister    History  Substance Use Topics  . Smoking status: Former Smoker -- 3.00 packs/day for 20 years  . Smokeless tobacco: Not on file  . Alcohol Use: No    Review of Systems  Constitutional: Negative for activity change and appetite change.  Eyes: Negative for pain.  Respiratory: Negative for chest tightness and shortness of breath.   Cardiovascular: Negative for chest pain and leg swelling.  Gastrointestinal: Positive for abdominal pain. Negative for nausea, vomiting and diarrhea.  Genitourinary: Negative for flank pain, decreased urine volume and  testicular pain.  Musculoskeletal: Negative for back pain and neck stiffness.  Skin: Negative for rash.  Neurological: Negative for weakness, numbness and headaches.  Psychiatric/Behavioral: Negative for behavioral problems.    Allergies  Flomax  Home Medications   Current Outpatient Rx  Name  Route  Sig  Dispense  Refill  . aspirin 81 MG tablet   Oral   Take 81 mg by mouth at bedtime.          Marland Kitchen atorvastatin (LIPITOR) 20 MG tablet   Oral   Take 20 mg by mouth daily at 6 PM.         . beta carotene 25000 UNIT capsule   Oral   Take 25,000 Units by mouth at bedtime.          . cloNIDine (CATAPRES) 0.2 MG tablet   Oral   Take 0.2 mg by mouth at bedtime.          Marland Kitchen levothyroxine (SYNTHROID, LEVOTHROID) 88 MCG tablet   Oral   Take 88 mcg by mouth at bedtime.         Marland Kitchen lisinopril (PRINIVIL,ZESTRIL) 20 MG tablet   Oral   Take 20 mg by mouth at bedtime.         . Aspirin-Salicylamide-Caffeine (BC HEADACHE POWDER PO)   Oral   Take 1 packet by mouth daily as needed. Pain          . ciprofloxacin (CIPRO) 750 MG tablet  Oral   Take 1 tablet (750 mg total) by mouth 2 (two) times daily.   14 tablet   0   . metroNIDAZOLE (FLAGYL) 500 MG tablet   Oral   Take 1 tablet (500 mg total) by mouth 3 (three) times daily.   21 tablet   0   . oxyCODONE-acetaminophen (PERCOCET/ROXICET) 5-325 MG per tablet   Oral   Take 1-2 tablets by mouth every 6 (six) hours as needed for moderate pain or severe pain.   10 tablet   0   . polyethylene glycol (MIRALAX / GLYCOLAX) packet   Oral   Take 17 g by mouth daily. When taking pain meds   14 each   0    BP 159/85  Pulse 95  Temp(Src) 98.6 F (37 C)  Resp 18  Ht 5' 8.5" (1.74 m)  Wt 197 lb (89.359 kg)  BMI 29.51 kg/m2  SpO2 97% Physical Exam  Nursing note and vitals reviewed. Constitutional: He is oriented to person, place, and time. He appears well-developed and well-nourished.  HENT:  Head: Normocephalic and  atraumatic.  Cardiovascular: Normal rate, regular rhythm and normal heart sounds.   No murmur heard. Pulmonary/Chest: Effort normal and breath sounds normal.  Abdominal: Soft. Bowel sounds are normal. He exhibits no distension and no mass. There is tenderness. There is no rebound and no guarding.  Moderate left lower quadrant tenderness. No masses. No hernias palpated there  Musculoskeletal: Normal range of motion. He exhibits no edema.  Neurological: He is alert and oriented to person, place, and time. No cranial nerve deficit.  Skin: Skin is warm and dry.  Psychiatric: He has a normal mood and affect.    ED Course  Procedures (including critical care time) Labs Review Labs Reviewed  CBC WITH DIFFERENTIAL - Abnormal; Notable for the following:    Monocytes Absolute 1.2 (*)    All other components within normal limits  BASIC METABOLIC PANEL - Abnormal; Notable for the following:    Glucose, Bld 129 (*)    All other components within normal limits  URINALYSIS, ROUTINE W REFLEX MICROSCOPIC - Abnormal; Notable for the following:    Specific Gravity, Urine <1.005 (*)    Hgb urine dipstick MODERATE (*)    All other components within normal limits  URINE MICROSCOPIC-ADD ON   Imaging Review Ct Abdomen Pelvis W Contrast  11/22/2013   CLINICAL DATA:  Left lower quadrant pain since this morning  EXAM: CT ABDOMEN AND PELVIS WITH CONTRAST  TECHNIQUE: Multidetector CT imaging of the abdomen and pelvis was performed using the standard protocol following bolus administration of intravenous contrast.  CONTRAST:  47mL OMNIPAQUE IOHEXOL 300 MG/ML SOLN, 159mL OMNIPAQUE IOHEXOL 300 MG/ML SOLN  COMPARISON:  None.  FINDINGS: Visualized lung bases clear.  No acute musculoskeletal findings.  Mild coronary arterial calcification. Calcification at the root of the thoracic aorta.  Liver and gallbladder normal. Spleen and pancreas are normal. Kidneys and adrenal glands are normal. Calcification of the abdominal  aorta is noted without dilatation.  There is a nonobstructive gas pattern. There is mild diverticulosis of the descending colon and mild to moderate diverticulosis of the sigmoid colon. At the junction of the descending and sigmoid colon there is moderate colon wall thickening with mild surrounding inflammatory change. There is no evidence of free air or fluid.  Bladder and reproductive organs are normal.  IMPRESSION: Acute diverticulitis involving the junction of the descending and sigmoid colon.   Electronically Signed   By: Kyung Rudd  Rubner M.D.   On: 11/22/2013 14:38    EKG Interpretation   None       MDM   1. Diverticulitis    Patient with abdominal pain. Well-appearing. Diverticulitis without abscess on CT. She is tolerated orals will be discharged antibiotics and pain medicine.    Jasper Riling. Alvino Chapel, MD 11/22/13 1513

## 2013-11-22 NOTE — ED Notes (Signed)
Pt c/o lower abd pain and "bloated feeling". Denies n/v/d. lnbm today. Denies urinary sx.

## 2013-11-22 NOTE — Discharge Instructions (Signed)
Diverticulitis °A diverticulum is a small pouch or sac on the colon. Diverticulosis is the presence of these diverticula on the colon. Diverticulitis is the irritation (inflammation) or infection of diverticula. °CAUSES  °The colon and its diverticula contain bacteria. If food particles block the tiny opening to a diverticulum, the bacteria inside can grow and cause an increase in pressure. This leads to infection and inflammation and is called diverticulitis. °SYMPTOMS  °· Abdominal pain and tenderness. Usually, the pain is located on the left side of your abdomen. However, it could be located elsewhere. °· Fever. °· Bloating. °· Feeling sick to your stomach (nausea). °· Throwing up (vomiting). °· Abnormal stools. °DIAGNOSIS  °Your caregiver will take a history and perform a physical exam. Since many things can cause abdominal pain, other tests may be necessary. Tests may include: °· Blood tests. °· Urine tests. °· X-ray of the abdomen. °· CT scan of the abdomen. °Sometimes, surgery is needed to determine if diverticulitis or other conditions are causing your symptoms. °TREATMENT  °Most of the time, you can be treated without surgery. Treatment includes: °· Resting the bowels by only having liquids for a few days. As you improve, you will need to eat a low-fiber diet. °· Intravenous (IV) fluids if you are losing body fluids (dehydrated). °· Antibiotic medicines that treat infections may be given. °· Pain and nausea medicine, if needed. °· Surgery if the inflamed diverticulum has burst. °HOME CARE INSTRUCTIONS  °· Try a clear liquid diet (broth, tea, or water for as long as directed by your caregiver). You may then gradually begin a low-fiber diet as tolerated.  °A low-fiber diet is a diet with less than 10 grams of fiber. Choose the foods below to reduce fiber in the diet: °· White breads, cereals, rice, and pasta. °· Cooked fruits and vegetables or soft fresh fruits and vegetables without the skin. °· Ground or  well-cooked tender beef, ham, veal, lamb, pork, or poultry. °· Eggs and seafood. °· After your diverticulitis symptoms have improved, your caregiver may put you on a high-fiber diet. A high-fiber diet includes 14 grams of fiber for every 1000 calories consumed. For a standard 2000 calorie diet, you would need 28 grams of fiber. Follow these diet guidelines to help you increase the fiber in your diet. It is important to slowly increase the amount fiber in your diet to avoid gas, constipation, and bloating. °· Choose whole-grain breads, cereals, pasta, and brown rice. °· Choose fresh fruits and vegetables with the skin on. Do not overcook vegetables because the more vegetables are cooked, the more fiber is lost. °· Choose more nuts, seeds, legumes, dried peas, beans, and lentils. °· Look for food products that have greater than 3 grams of fiber per serving on the Nutrition Facts label. °· Take all medicine as directed by your caregiver. °· If your caregiver has given you a follow-up appointment, it is very important that you go. Not going could result in lasting (chronic) or permanent injury, pain, and disability. If there is any problem keeping the appointment, call to reschedule. °SEEK MEDICAL CARE IF:  °· Your pain does not improve. °· You have a hard time advancing your diet beyond clear liquids. °· Your bowel movements do not return to normal. °SEEK IMMEDIATE MEDICAL CARE IF:  °· Your pain becomes worse. °· You have an oral temperature above 102° F (38.9° C), not controlled by medicine. °· You have repeated vomiting. °· You have bloody or black, tarry stools. °·   Symptoms that brought you to your caregiver become worse or are not getting better. °MAKE SURE YOU:  °· Understand these instructions. °· Will watch your condition. °· Will get help right away if you are not doing well or get worse. °Document Released: 08/16/2005 Document Revised: 01/29/2012 Document Reviewed: 12/12/2010 °[employer]® Patient Information  ©2014 [employer]. ° °

## 2013-12-12 ENCOUNTER — Other Ambulatory Visit: Payer: Self-pay | Admitting: *Deleted

## 2013-12-12 ENCOUNTER — Telehealth: Payer: Self-pay | Admitting: Family Medicine

## 2013-12-12 DIAGNOSIS — Z125 Encounter for screening for malignant neoplasm of prostate: Secondary | ICD-10-CM

## 2013-12-12 DIAGNOSIS — E785 Hyperlipidemia, unspecified: Secondary | ICD-10-CM

## 2013-12-12 DIAGNOSIS — Z79899 Other long term (current) drug therapy: Secondary | ICD-10-CM

## 2013-12-12 DIAGNOSIS — I10 Essential (primary) hypertension: Secondary | ICD-10-CM

## 2013-12-12 NOTE — Telephone Encounter (Signed)
Lip liv m7 psa 

## 2013-12-12 NOTE — Telephone Encounter (Signed)
Had: Lip, Liv on 8/12

## 2013-12-12 NOTE — Telephone Encounter (Signed)
Notified pt via VM

## 2013-12-12 NOTE — Telephone Encounter (Signed)
Does patient need order for blood work? °

## 2013-12-22 DIAGNOSIS — Z79899 Other long term (current) drug therapy: Secondary | ICD-10-CM | POA: Diagnosis not present

## 2013-12-22 DIAGNOSIS — E785 Hyperlipidemia, unspecified: Secondary | ICD-10-CM | POA: Diagnosis not present

## 2013-12-22 DIAGNOSIS — Z125 Encounter for screening for malignant neoplasm of prostate: Secondary | ICD-10-CM | POA: Diagnosis not present

## 2013-12-22 DIAGNOSIS — I10 Essential (primary) hypertension: Secondary | ICD-10-CM | POA: Diagnosis not present

## 2013-12-23 LAB — LIPID PANEL
CHOL/HDL RATIO: 5 ratio
Cholesterol: 199 mg/dL (ref 0–200)
HDL: 40 mg/dL (ref >39)
LDL CALC: 127 mg/dL — AB (ref 0–99)
TRIGLYCERIDES: 162 mg/dL — AB (ref <150)
VLDL: 32 mg/dL (ref 0–40)

## 2013-12-23 LAB — BASIC METABOLIC PANEL
BUN: 14 mg/dL (ref 6–23)
CO2: 29 meq/L (ref 19–32)
Calcium: 9.5 mg/dL (ref 8.4–10.5)
Chloride: 104 mEq/L (ref 96–112)
Creat: 0.73 mg/dL (ref 0.50–1.35)
GLUCOSE: 98 mg/dL (ref 70–99)
POTASSIUM: 4.8 meq/L (ref 3.5–5.3)
Sodium: 142 mEq/L (ref 135–145)

## 2013-12-23 LAB — HEPATIC FUNCTION PANEL
ALK PHOS: 56 U/L (ref 39–117)
ALT: 28 U/L (ref 0–53)
AST: 16 U/L (ref 0–37)
Albumin: 4.3 g/dL (ref 3.5–5.2)
BILIRUBIN DIRECT: 0.1 mg/dL (ref 0.0–0.3)
BILIRUBIN INDIRECT: 0.5 mg/dL (ref 0.2–1.2)
BILIRUBIN TOTAL: 0.6 mg/dL (ref 0.2–1.2)
Total Protein: 6.2 g/dL (ref 6.0–8.3)

## 2013-12-23 LAB — PSA: PSA: 0.47 ng/mL (ref <=4.00)

## 2013-12-31 ENCOUNTER — Telehealth: Payer: Self-pay | Admitting: *Deleted

## 2013-12-31 ENCOUNTER — Telehealth: Payer: Self-pay | Admitting: Family Medicine

## 2013-12-31 ENCOUNTER — Ambulatory Visit (INDEPENDENT_AMBULATORY_CARE_PROVIDER_SITE_OTHER): Payer: Medicare Other | Admitting: Family Medicine

## 2013-12-31 VITALS — BP 130/82 | Ht 67.75 in | Wt 203.0 lb

## 2013-12-31 DIAGNOSIS — I10 Essential (primary) hypertension: Secondary | ICD-10-CM | POA: Diagnosis not present

## 2013-12-31 DIAGNOSIS — E785 Hyperlipidemia, unspecified: Secondary | ICD-10-CM

## 2013-12-31 DIAGNOSIS — R7301 Impaired fasting glucose: Secondary | ICD-10-CM | POA: Diagnosis not present

## 2013-12-31 NOTE — Telephone Encounter (Signed)
[facility] to notify bloodwork was normal per Dr. Richardson Landry.

## 2013-12-31 NOTE — Progress Notes (Signed)
   Subjective:    Patient ID: Cameron Smith, male    DOB: [birth year], 73 y.o.   MRN: [id number]  HPI Patient arrives office for multiple concerns. History of impaired fasting glucose. In fact a one-time the prior specialist called it diabetes. Sugars have been generally good since then. Patient's trying to watch his diet in this regard.  History of high blood pressure. Compliant with medication. Trying the watch his salt intake. Not exercising much this time of year by his own admission.  History of hypothyroidism. Claims compliance with medication.  Watching cholesterol intake. Compliant with cholesterol medication. No obvious side effects with that. Has cut the fats down in the diet considerably. Results for orders placed in visit on 12/12/13  LIPID PANEL      Result Value Ref Range   Cholesterol 199  0 - 200 mg/dL   Triglycerides 162 (*) <150 mg/dL   HDL 40  >39 mg/dL   Total CHOL/HDL Ratio 5.0     VLDL 32  0 - 40 mg/dL   LDL Cholesterol 127 (*) 0 - 99 mg/dL  HEPATIC FUNCTION PANEL      Result Value Ref Range   Total Bilirubin 0.6  0.2 - 1.2 mg/dL   Bilirubin, Direct 0.1  0.0 - 0.3 mg/dL   Indirect Bilirubin 0.5  0.2 - 1.2 mg/dL   Alkaline Phosphatase 56  39 - 117 U/L   AST 16  0 - 37 U/L   ALT 28  0 - 53 U/L   Total Protein 6.2  6.0 - 8.3 g/dL   Albumin 4.3  3.5 - 5.2 g/dL  PSA      Result Value Ref Range   PSA 0.47  <=4.00 ng/mL  BASIC METABOLIC PANEL      Result Value Ref Range   Sodium 142  135 - 145 mEq/L   Potassium 4.8  3.5 - 5.3 mEq/L   Chloride 104  96 - 112 mEq/L   CO2 29  19 - 32 mEq/L   Glucose, Bld 98  70 - 99 mg/dL   BUN 14  6 - 23 mg/dL   Creat 0.73  0.50 - 1.35 mg/dL   Calcium 9.5  8.4 - 10.5 mg/dL     Review of Systems No headache no chest pain no back pain no change about habits no blood in stool ROS otherwise negative    Objective:   Physical Exam  Alert no apparent distress. HEENT normal. Lungs clear. Heart regular in rhythm. Ankles  edema.      Assessment & Plan:  Impression 1 hyperlipidemia good control. #2 impaired fasting glucose suboptimal in discussed #3 hypothyroidism. #4 hypertension good control. Plan diet exercise discussed maintain same medications. Preventive exam in 3 months. WSL

## 2013-12-31 NOTE — Telephone Encounter (Signed)
Notified patient that bloodwork is normal. Please mail patient a copy of bloodwork results.

## 2014-03-07 ENCOUNTER — Other Ambulatory Visit: Payer: Self-pay | Admitting: Family Medicine

## 2014-04-05 NOTE — Telephone Encounter (Signed)
aaa

## 2014-04-06 ENCOUNTER — Other Ambulatory Visit: Payer: Self-pay | Admitting: Family Medicine

## 2014-06-17 DIAGNOSIS — H43819 Vitreous degeneration, unspecified eye: Secondary | ICD-10-CM | POA: Diagnosis not present

## 2014-06-17 DIAGNOSIS — H524 Presbyopia: Secondary | ICD-10-CM | POA: Diagnosis not present

## 2014-06-17 DIAGNOSIS — H179 Unspecified corneal scar and opacity: Secondary | ICD-10-CM | POA: Diagnosis not present

## 2014-06-17 DIAGNOSIS — H5200 Hypermetropia, unspecified eye: Secondary | ICD-10-CM | POA: Diagnosis not present

## 2014-07-29 ENCOUNTER — Telehealth: Payer: Self-pay | Admitting: Family Medicine

## 2014-07-29 DIAGNOSIS — Z79899 Other long term (current) drug therapy: Secondary | ICD-10-CM | POA: Diagnosis not present

## 2014-07-29 DIAGNOSIS — E039 Hypothyroidism, unspecified: Secondary | ICD-10-CM | POA: Diagnosis not present

## 2014-07-29 DIAGNOSIS — R7301 Impaired fasting glucose: Secondary | ICD-10-CM | POA: Diagnosis not present

## 2014-07-29 DIAGNOSIS — E782 Mixed hyperlipidemia: Secondary | ICD-10-CM | POA: Diagnosis not present

## 2014-07-29 NOTE — Telephone Encounter (Signed)
Patient had Lipid, Liver, PSA, Met 7 and CBC 2/15

## 2014-07-29 NOTE — Telephone Encounter (Signed)
Patient needs order for blood work. °

## 2014-07-29 NOTE — Telephone Encounter (Signed)
Bloodwork ordered. Wife was notified.

## 2014-07-29 NOTE — Telephone Encounter (Signed)
Lip liv glu tsh 

## 2014-07-31 LAB — HEPATIC FUNCTION PANEL
ALT: 41 U/L (ref 0–53)
AST: 31 U/L (ref 0–37)
Albumin: 4.3 g/dL (ref 3.5–5.2)
Alkaline Phosphatase: 65 U/L (ref 39–117)
BILIRUBIN DIRECT: 0.1 mg/dL (ref 0.0–0.3)
BILIRUBIN INDIRECT: 0.7 mg/dL (ref 0.2–1.2)
Total Bilirubin: 0.8 mg/dL (ref 0.2–1.2)
Total Protein: 6.4 g/dL (ref 6.0–8.3)

## 2014-07-31 LAB — LIPID PANEL
Cholesterol: 187 mg/dL (ref 0–200)
HDL: 41 mg/dL (ref >39)
LDL Cholesterol: 118 mg/dL — ABNORMAL HIGH (ref 0–99)
Total CHOL/HDL Ratio: 4.6 Ratio
Triglycerides: 141 mg/dL (ref <150)
VLDL: 28 mg/dL (ref 0–40)

## 2014-07-31 LAB — GLUCOSE, RANDOM: Glucose, Bld: 98 mg/dL (ref 70–99)

## 2014-07-31 LAB — TSH: TSH: 2.757 u[IU]/mL (ref 0.350–4.500)

## 2014-08-04 ENCOUNTER — Encounter: Payer: Self-pay | Admitting: Family Medicine

## 2014-08-04 ENCOUNTER — Ambulatory Visit (INDEPENDENT_AMBULATORY_CARE_PROVIDER_SITE_OTHER): Payer: Medicare Other | Admitting: Family Medicine

## 2014-08-04 VITALS — BP 128/80 | Ht 67.75 in | Wt 201.4 lb

## 2014-08-04 DIAGNOSIS — Z0000 Encounter for general adult medical examination without abnormal findings: Secondary | ICD-10-CM | POA: Diagnosis not present

## 2014-08-04 DIAGNOSIS — Z23 Encounter for immunization: Secondary | ICD-10-CM | POA: Diagnosis not present

## 2014-08-04 DIAGNOSIS — I10 Essential (primary) hypertension: Secondary | ICD-10-CM | POA: Diagnosis not present

## 2014-08-04 DIAGNOSIS — E039 Hypothyroidism, unspecified: Secondary | ICD-10-CM | POA: Diagnosis not present

## 2014-08-04 DIAGNOSIS — R7301 Impaired fasting glucose: Secondary | ICD-10-CM

## 2014-08-04 MED ORDER — LEVOTHYROXINE SODIUM 88 MCG PO TABS: ORAL_TABLET | ORAL | Status: DC

## 2014-08-04 MED ORDER — LISINOPRIL 20 MG PO TABS: ORAL_TABLET | ORAL | Status: DC

## 2014-08-04 MED ORDER — ATORVASTATIN CALCIUM 20 MG PO TABS: ORAL_TABLET | ORAL | Status: DC

## 2014-08-04 NOTE — Progress Notes (Signed)
Subjective:    Patient ID: Cameron Smith, male    DOB: June 11, 1941, 73 y.o.   MRN: [id number]  HPI AWV- Annual Wellness Visit  The patient was seen for their annual wellness visit. The patient's past medical history, surgical history, and family history were reviewed. Pertinent vaccines were reviewed ( tetanus, pneumonia, shingles, flu) The patient's medication list was reviewed and updated.  The height and weight were entered. The patient's current BMI is:30.85  Cognitive screening was completed. Outcome of Mini - Cog: passed  Falls within the past 6 months:none  Current tobacco usage: non-smoker (All patients who use tobacco were given written and verbal information on quitting)  Recent listing of emergency department/hospitalizations over the past year were reviewed.  current specialist the patient sees on a regular basis: none   Medicare annual wellness visit patient questionnaire was reviewed.  A written screening schedule for the patient for the next 5-10 years was given. Appropriate discussion of followup regarding next visit was discussed.  Patient needs refills on medications. Patient states he has no concerns at this time.   Results for orders placed in visit on 07/29/14  LIPID PANEL      Result Value Ref Range   Cholesterol 187  0 - 200 mg/dL   Triglycerides 141  <150 mg/dL   HDL 41  >39 mg/dL   Total CHOL/HDL Ratio 4.6     VLDL 28  0 - 40 mg/dL   LDL Cholesterol 118 (*) 0 - 99 mg/dL  HEPATIC FUNCTION PANEL      Result Value Ref Range   Total Bilirubin 0.8  0.2 - 1.2 mg/dL   Bilirubin, Direct 0.1  0.0 - 0.3 mg/dL   Indirect Bilirubin 0.7  0.2 - 1.2 mg/dL   Alkaline Phosphatase 65  39 - 117 U/L   AST 31  0 - 37 U/L   ALT 41  0 - 53 U/L   Total Protein 6.4  6.0 - 8.3 g/dL   Albumin 4.3  3.5 - 5.2 g/dL  GLUCOSE, RANDOM      Result Value Ref Range   Glucose, Bld 98  70 - 99 mg/dL  TSH      Result Value Ref Range   TSH 2.757  0.350 - 4.500 uIU/mL   exer  ise not much, stays active    a patient claims compliance with his blood pressure medicine. No obvious side effects. Also compliant with thyroid medicine does not medicine does. Does not feel any symptoms of low or high thyroid at this time.   Review of Systems  Constitutional: Negative for fever, activity change and appetite change.  HENT: Negative for congestion and rhinorrhea.   Eyes: Negative for discharge.  Respiratory: Negative for cough and wheezing.   Cardiovascular: Negative for chest pain.  Gastrointestinal: Negative for vomiting, abdominal pain and blood in stool.  Genitourinary: Negative for frequency and difficulty urinating.  Musculoskeletal: Negative for neck pain.  Skin: Negative for rash.  Allergic/Immunologic: Negative for environmental allergies and food allergies.  Neurological: Negative for weakness and headaches.  Psychiatric/Behavioral: Negative for agitation.  All other systems reviewed and are negative.      Objective:   Physical Exam  Vitals reviewed. Constitutional: He appears well-developed and well-nourished.  HENT:  Head: Normocephalic and atraumatic.  Right Ear: External ear normal.  Left Ear: External ear normal.  Nose: Nose normal.  Mouth/Throat: Oropharynx is clear and moist.  Thyroid nonpalpable  Eyes: EOM are normal. Pupils are equal, round,  and reactive to light.  Neck: Normal range of motion. Neck supple. No thyromegaly present.  Cardiovascular: Normal rate, regular rhythm and normal heart sounds.   No murmur heard. Pulmonary/Chest: Effort normal and breath sounds normal. No respiratory distress. He has no wheezes.  Abdominal: Soft. Bowel sounds are normal. He exhibits no distension and no mass. There is no tenderness.  Genitourinary: Prostate normal and penis normal.  Musculoskeletal: Normal range of motion. He exhibits no edema.  Lymphadenopathy:    He has no cervical adenopathy.  Neurological: He is alert. He exhibits normal muscle  tone.  Skin: Skin is warm and dry. No erythema.  Psychiatric: He has a normal mood and affect. His behavior is normal. Judgment normal.          Assessment & Plan:  Impression 1 wellness exam #2 hypertension good control. #3 hypothyroidism good control. Plan diet exercise discussed. Regular medications prescribed. Meds refilled. Flu shot encourage. Followup in 6 months. WSL

## 2014-09-22 ENCOUNTER — Other Ambulatory Visit: Payer: Self-pay | Admitting: Dermatology

## 2014-09-22 DIAGNOSIS — L570 Actinic keratosis: Secondary | ICD-10-CM | POA: Diagnosis not present

## 2014-09-22 DIAGNOSIS — D0430 Carcinoma in situ of skin of unspecified part of face: Secondary | ICD-10-CM | POA: Diagnosis not present

## 2014-09-22 DIAGNOSIS — D0439 Carcinoma in situ of skin of other parts of face: Secondary | ICD-10-CM | POA: Diagnosis not present

## 2014-11-05 DIAGNOSIS — D0430 Carcinoma in situ of skin of unspecified part of face: Secondary | ICD-10-CM | POA: Diagnosis not present

## 2014-12-12 ENCOUNTER — Other Ambulatory Visit: Payer: Self-pay | Admitting: Family Medicine

## 2015-01-06 ENCOUNTER — Telehealth: Payer: Self-pay | Admitting: Family Medicine

## 2015-01-06 DIAGNOSIS — Z79899 Other long term (current) drug therapy: Secondary | ICD-10-CM

## 2015-01-06 DIAGNOSIS — I10 Essential (primary) hypertension: Secondary | ICD-10-CM

## 2015-01-06 DIAGNOSIS — Z125 Encounter for screening for malignant neoplasm of prostate: Secondary | ICD-10-CM

## 2015-01-06 DIAGNOSIS — E785 Hyperlipidemia, unspecified: Secondary | ICD-10-CM

## 2015-01-06 NOTE — Telephone Encounter (Signed)
Apt already scheduled

## 2015-01-06 NOTE — Telephone Encounter (Signed)
psa lip liv m7

## 2015-01-06 NOTE — Telephone Encounter (Signed)
Needs blood work  Last labs 07/2014 TSH, Glucose [place], Hep func, Lipid

## 2015-01-06 NOTE — Telephone Encounter (Signed)
Be sure also to sched 6 mo visit

## 2015-01-07 NOTE — Telephone Encounter (Signed)
Pt's wife notified and verbalized understanding that he will need to go to [place].

## 2015-01-15 DIAGNOSIS — E785 Hyperlipidemia, unspecified: Secondary | ICD-10-CM | POA: Diagnosis not present

## 2015-01-15 DIAGNOSIS — I10 Essential (primary) hypertension: Secondary | ICD-10-CM | POA: Diagnosis not present

## 2015-01-15 DIAGNOSIS — Z79899 Other long term (current) drug therapy: Secondary | ICD-10-CM | POA: Diagnosis not present

## 2015-01-15 DIAGNOSIS — Z125 Encounter for screening for malignant neoplasm of prostate: Secondary | ICD-10-CM | POA: Diagnosis not present

## 2015-01-15 LAB — LIPID PANEL
Cholesterol: 188 mg/dL (ref 0–200)
HDL: 35 mg/dL — ABNORMAL LOW (ref >=40)
LDL Cholesterol: 134 mg/dL — ABNORMAL HIGH (ref 0–99)
TRIGLYCERIDES: 95 mg/dL (ref <150)
Total CHOL/HDL Ratio: 5.4 Ratio
VLDL: 19 mg/dL (ref 0–40)

## 2015-01-15 LAB — BASIC METABOLIC PANEL
BUN: 20 mg/dL (ref 6–23)
CALCIUM: 9.2 mg/dL (ref 8.4–10.5)
CHLORIDE: 106 meq/L (ref 96–112)
CO2: 26 mEq/L (ref 19–32)
CREATININE: 0.8 mg/dL (ref 0.50–1.35)
Glucose, Bld: 102 mg/dL — ABNORMAL HIGH (ref 70–99)
Potassium: 5 mEq/L (ref 3.5–5.3)
Sodium: 139 mEq/L (ref 135–145)

## 2015-01-15 LAB — HEPATIC FUNCTION PANEL
ALT: 27 U/L (ref 0–53)
AST: 20 U/L (ref 0–37)
Albumin: 4.2 g/dL (ref 3.5–5.2)
Alkaline Phosphatase: 57 U/L (ref 39–117)
BILIRUBIN DIRECT: 0.1 mg/dL (ref 0.0–0.3)
BILIRUBIN INDIRECT: 0.4 mg/dL (ref 0.2–1.2)
Total Bilirubin: 0.5 mg/dL (ref 0.2–1.2)
Total Protein: 6.2 g/dL (ref 6.0–8.3)

## 2015-01-16 LAB — PSA, MEDICARE: PSA: 0.44 ng/mL (ref <=4.00)

## 2015-01-20 ENCOUNTER — Encounter: Payer: Self-pay | Admitting: Family Medicine

## 2015-01-20 ENCOUNTER — Ambulatory Visit (INDEPENDENT_AMBULATORY_CARE_PROVIDER_SITE_OTHER): Payer: Medicare Other | Admitting: Family Medicine

## 2015-01-20 VITALS — BP 142/86 | Ht 67.75 in | Wt 205.4 lb

## 2015-01-20 DIAGNOSIS — M79604 Pain in right leg: Secondary | ICD-10-CM | POA: Diagnosis not present

## 2015-01-20 NOTE — Progress Notes (Signed)
   Subjective:    Patient ID: Cameron Smith, male    DOB: 07/29/1941, 74 y.o.   MRN: [id number]  HPI Patient arrives with right leg strain from loading boat yest at [place].   Was trying to pull back a mechanism with his heel by flexing his leg. Had a sudden deep tearing sensation in right posterior thigh  Right post thigh pain, fairly severe  Took three of the ibu   Used heating ppad.   no hip pain no headache no chest pain Review of Systems  ROS otherwise negative    Objective:   Physical Exam  alert slight malaise vital stable lungs clear heart rare rhythm right hip right knee normal right posterior thigh tenderness to deep palpation no deformity palpated       Assessment & Plan:   impression quadriceps muscle strain plan local measures discussed  Ibuprofen 3 3 times a day with food. No x-rays rationale discussed. WSL

## 2015-01-27 ENCOUNTER — Ambulatory Visit: Payer: Medicare Other | Admitting: Family Medicine

## 2015-01-28 ENCOUNTER — Ambulatory Visit (INDEPENDENT_AMBULATORY_CARE_PROVIDER_SITE_OTHER): Payer: Medicare Other | Admitting: Family Medicine

## 2015-01-28 ENCOUNTER — Encounter: Payer: Self-pay | Admitting: Family Medicine

## 2015-01-28 VITALS — BP 138/90 | Ht 67.75 in | Wt 204.0 lb

## 2015-01-28 DIAGNOSIS — E785 Hyperlipidemia, unspecified: Secondary | ICD-10-CM

## 2015-01-28 DIAGNOSIS — R7301 Impaired fasting glucose: Secondary | ICD-10-CM

## 2015-01-28 DIAGNOSIS — I10 Essential (primary) hypertension: Secondary | ICD-10-CM | POA: Diagnosis not present

## 2015-01-28 NOTE — Progress Notes (Signed)
   Subjective:    Patient ID: Cameron Smith, male    DOB: [birth year], 74 y.o.   MRN: [id number]  Hypertension This is a chronic problem. The current episode started more than 1 year ago. Risk factors for coronary artery disease include male gender, obesity and dyslipidemia. Improvement on treatment: lisinopril. There are no compliance problems.    Results for orders placed or performed in visit on 01/06/15  PSA, Medicare  Result Value Ref Range   PSA 0.44 <=4.00 ng/mL  Lipid panel  Result Value Ref Range   Cholesterol 188 0 - 200 mg/dL   Triglycerides 95 <150 mg/dL   HDL 35 (L) >=40 mg/dL   Total CHOL/HDL Ratio 5.4 Ratio   VLDL 19 0 - 40 mg/dL   LDL Cholesterol 134 (H) 0 - 99 mg/dL  Hepatic function panel  Result Value Ref Range   Total Bilirubin 0.5 0.2 - 1.2 mg/dL   Bilirubin, Direct 0.1 0.0 - 0.3 mg/dL   Indirect Bilirubin 0.4 0.2 - 1.2 mg/dL   Alkaline Phosphatase 57 39 - 117 U/L   AST 20 0 - 37 U/L   ALT 27 0 - 53 U/L   Total Protein 6.2 6.0 - 8.3 g/dL   Albumin 4.2 3.5 - 5.2 g/dL  Basic metabolic panel  Result Value Ref Range   Sodium 139 135 - 145 mEq/L   Potassium 5.0 3.5 - 5.3 mEq/L   Chloride 106 96 - 112 mEq/L   CO2 26 19 - 32 mEq/L   Glucose, Bld 102 (H) 70 - 99 mg/dL   BUN 20 6 - 23 mg/dL   Creat 0.80 0.50 - 1.35 mg/dL   Calcium 9.2 8.4 - 10.5 mg/dL   BPs generally 160 over 68 at times lower. Claims compliance with medication.  Pulled tick off leg. Notes a small red bump. No fever no chills.  Admits to noncompliance with diet and not exercising regularly  Review of Systems    no headache no chest pain no back pain no abdominal pain no change in bowel habits no blood in stool ROS otherwise negative Objective:   Physical Exam  Alert no apparent distress blood pressure good on repeat HEENT normal. Lungs clear. Heart regular rate and rhythm. Ankles without edema. Small red papule at site of bite hematoma present at site of prior injury      Assessment &  Plan:  Impression 1 hypertension good control #2 hyperlipidemia fair control #3 glucose intolerance discussed #4 tick bite discussed plan maintain same medications. Diet exercise discussed and strongly encourage. Follow-up in 6 months. WSL

## 2015-06-21 ENCOUNTER — Other Ambulatory Visit: Payer: Self-pay | Admitting: Family Medicine

## 2015-07-22 ENCOUNTER — Telehealth: Payer: Self-pay | Admitting: Family Medicine

## 2015-07-22 DIAGNOSIS — E039 Hypothyroidism, unspecified: Secondary | ICD-10-CM

## 2015-07-22 DIAGNOSIS — Z79899 Other long term (current) drug therapy: Secondary | ICD-10-CM

## 2015-07-22 DIAGNOSIS — E785 Hyperlipidemia, unspecified: Secondary | ICD-10-CM

## 2015-07-22 DIAGNOSIS — R7301 Impaired fasting glucose: Secondary | ICD-10-CM

## 2015-07-22 NOTE — Telephone Encounter (Signed)
Lip liv tsh glu, tell him i cannot guarantee anything will be "free"

## 2015-07-22 NOTE — Telephone Encounter (Signed)
Pt needs bw orders please, he says if its a free lab an he needs it order it, plus anything else You've already been ordering for his labs   Last labs 01/15/15 PSA, Lip, Hep, BMP    Pt aware of [facility], call when ready

## 2015-07-22 NOTE — Telephone Encounter (Signed)
[facility] 07/22/15 (orders in epic)

## 2015-07-22 NOTE — Telephone Encounter (Signed)
Spoke with patient and informed patient that labs were ordered. Patient verbalized understanding.

## 2015-07-27 ENCOUNTER — Other Ambulatory Visit: Payer: Self-pay | Admitting: *Deleted

## 2015-07-27 ENCOUNTER — Telehealth: Payer: Self-pay | Admitting: Family Medicine

## 2015-07-27 MED ORDER — LISINOPRIL 20 MG PO TABS: ORAL_TABLET | ORAL | Status: DC

## 2015-07-27 MED ORDER — ATORVASTATIN CALCIUM 20 MG PO TABS: ORAL_TABLET | ORAL | Status: DC

## 2015-07-27 MED ORDER — LEVOTHYROXINE SODIUM 88 MCG PO TABS: ORAL_TABLET | ORAL | Status: DC

## 2015-07-27 NOTE — Telephone Encounter (Signed)
Pt is needing refills on his atorvastatin,lisinopril,and levothyroxine sent to [facility]

## 2015-07-27 NOTE — Telephone Encounter (Signed)
meds sent to pharm. Pt notified.  

## 2015-07-28 DIAGNOSIS — E039 Hypothyroidism, unspecified: Secondary | ICD-10-CM | POA: Diagnosis not present

## 2015-07-28 DIAGNOSIS — Z79899 Other long term (current) drug therapy: Secondary | ICD-10-CM | POA: Diagnosis not present

## 2015-07-28 DIAGNOSIS — E785 Hyperlipidemia, unspecified: Secondary | ICD-10-CM | POA: Diagnosis not present

## 2015-07-28 DIAGNOSIS — R7301 Impaired fasting glucose: Secondary | ICD-10-CM | POA: Diagnosis not present

## 2015-07-29 LAB — HEPATIC FUNCTION PANEL
ALBUMIN: 4.3 g/dL (ref 3.5–4.8)
ALK PHOS: 61 IU/L (ref 39–117)
ALT: 29 IU/L (ref 0–44)
AST: 24 IU/L (ref 0–40)
BILIRUBIN TOTAL: 0.6 mg/dL (ref 0.0–1.2)
Bilirubin, Direct: 0.15 mg/dL (ref 0.00–0.40)
TOTAL PROTEIN: 6.4 g/dL (ref 6.0–8.5)

## 2015-07-29 LAB — GLUCOSE, RANDOM: Glucose: 99 mg/dL (ref 65–99)

## 2015-07-29 LAB — LIPID PANEL
CHOLESTEROL TOTAL: 184 mg/dL (ref 100–199)
Chol/HDL Ratio: 4.7 ratio units (ref 0.0–5.0)
HDL: 39 mg/dL — ABNORMAL LOW (ref >39)
LDL Calculated: 108 mg/dL — ABNORMAL HIGH (ref 0–99)
Triglycerides: 184 mg/dL — ABNORMAL HIGH (ref 0–149)
VLDL CHOLESTEROL CAL: 37 mg/dL (ref 5–40)

## 2015-07-29 LAB — TSH: TSH: 2.19 u[IU]/mL (ref 0.450–4.500)

## 2015-07-30 ENCOUNTER — Encounter: Payer: Self-pay | Admitting: Family Medicine

## 2015-07-30 ENCOUNTER — Ambulatory Visit (INDEPENDENT_AMBULATORY_CARE_PROVIDER_SITE_OTHER): Payer: Medicare Other | Admitting: Family Medicine

## 2015-07-30 VITALS — BP 144/80 | Ht 67.75 in | Wt 205.4 lb

## 2015-07-30 DIAGNOSIS — R7301 Impaired fasting glucose: Secondary | ICD-10-CM

## 2015-07-30 DIAGNOSIS — E039 Hypothyroidism, unspecified: Secondary | ICD-10-CM | POA: Diagnosis not present

## 2015-07-30 DIAGNOSIS — R21 Rash and other nonspecific skin eruption: Secondary | ICD-10-CM | POA: Diagnosis not present

## 2015-07-30 DIAGNOSIS — I10 Essential (primary) hypertension: Secondary | ICD-10-CM | POA: Diagnosis not present

## 2015-07-30 DIAGNOSIS — E785 Hyperlipidemia, unspecified: Secondary | ICD-10-CM

## 2015-07-30 MED ORDER — LEVOTHYROXINE SODIUM 88 MCG PO TABS: ORAL_TABLET | ORAL | Status: DC

## 2015-07-30 MED ORDER — ATORVASTATIN CALCIUM 20 MG PO TABS: ORAL_TABLET | ORAL | Status: DC

## 2015-07-30 MED ORDER — LISINOPRIL 20 MG PO TABS: ORAL_TABLET | ORAL | Status: DC

## 2015-07-30 NOTE — Progress Notes (Signed)
   Subjective:    Patient ID: Cameron Smith, male    DOB: February 21, 1941, 74 y.o.   MRN: [id number] Patient presents with numerous distinct concerns. Hypertension This is a chronic problem. The current episode started more than 1 year ago. The problem has been gradually improving since onset. There are no associated agents to hypertension. There are no known risk factors for coronary artery disease. Treatments tried: lisinopril. The current treatment provides moderate improvement. There are no compliance problems.    Patient states that he has mole on his right thigh area. Patient concerned about it He would like the doctor to assess this.   Compliant with lipid medic ine. No ob s es. Does not miss a dose. Mostly watch his diet.  complinat with thyr med. Occasional fatigue at times. No other symptoms of high or low thyroid.      Results for orders placed or performed in visit on 07/22/15  Lipid panel  Result Value Ref Range   Cholesterol, Total 184 100 - 199 mg/dL   Triglycerides 184 (H) 0 - 149 mg/dL   HDL 39 (L) >39 mg/dL   VLDL Cholesterol Cal 37 5 - 40 mg/dL   LDL Calculated 108 (H) 0 - 99 mg/dL   Chol/HDL Ratio 4.7 0.0 - 5.0 ratio units  Hepatic function panel  Result Value Ref Range   Total Protein 6.4 6.0 - 8.5 g/dL   Albumin 4.3 3.5 - 4.8 g/dL   Bilirubin Total 0.6 0.0 - 1.2 mg/dL   Bilirubin, Direct 0.15 0.00 - 0.40 mg/dL   Alkaline Phosphatase 61 39 - 117 IU/L   AST 24 0 - 40 IU/L   ALT 29 0 - 44 IU/L  Glucose  Result Value Ref Range   Glucose 99 65 - 99 mg/dL  TSH  Result Value Ref Range   TSH 2.190 0.450 - 4.500 uIU/mL      Review of Systems No headache no chest pain no back pain no abdominal pain no change in bowel habits no blood in stool    Objective:   Physical Exam  Alert vitals stable. H&T normal thyroid nonpalpable neck supple lungs clear. Heart regular in rhythm. Ankles edema right posterior thigh distinct seborrheic keratosis      Assessment &  Plan:  Impression 1 distinct seborrheic keratosis reassured no need for dermatology intervention #2 hypertension good control discussed #3 hyper lipidemia also excellent control discussed #4 impaired fasting glucose improved discussed in encourage #5 hypothyroidism stable plan all medications refilled same dose. Diet exercise discussed. Hold off on dermatology referral. WSL

## 2015-09-10 DIAGNOSIS — Z23 Encounter for immunization: Secondary | ICD-10-CM | POA: Diagnosis not present

## 2015-11-30 ENCOUNTER — Ambulatory Visit (INDEPENDENT_AMBULATORY_CARE_PROVIDER_SITE_OTHER): Payer: Medicare Other | Admitting: Family Medicine

## 2015-11-30 ENCOUNTER — Encounter: Payer: Self-pay | Admitting: Family Medicine

## 2015-11-30 VITALS — BP 132/88 | Temp 99.4°F | Ht 67.75 in | Wt 200.0 lb

## 2015-11-30 DIAGNOSIS — J329 Chronic sinusitis, unspecified: Secondary | ICD-10-CM

## 2015-11-30 MED ORDER — AMOXICILLIN 500 MG PO CAPS: 500.0000 mg | ORAL_CAPSULE | Freq: Three times a day (TID) | ORAL | Status: DC

## 2015-11-30 NOTE — Progress Notes (Signed)
   Subjective:    Patient ID: Cameron Smith, male    DOB: [birth year], 75 y.o.   MRN: [id number]  Cough This is a new problem. Episode onset: 3 days ago. Associated symptoms include headaches, nasal congestion and a sore throat. Treatments tried: mucinex.   Headache frontal worse with change of position sharp at times aching at times  Sore throat worse in morning next  Cough somewhat productive   Review of Systems  HENT: Positive for sore throat.   Respiratory: Positive for cough.   Neurological: Positive for headaches.   ROS otherwise negative     Objective:   Physical Exam Alert, mild malaise. Hydration good Vitals stable. frontal/ maxillary tenderness evident positive nasal congestion. pharynx normal neck supple  lungs clear/no crackles or wheezes. heart regular in rhythm        Assessment & Plan:  Impression rhinosinusitis likely post viral, discussed with patient. plan antibiotics prescribed. Questions answered. Symptomatic care discussed. warning signs discussed. WSL Patient wondered why we don't give injections for sinus infection, educated. R

## 2016-01-06 ENCOUNTER — Encounter: Payer: Self-pay | Admitting: Family Medicine

## 2016-01-06 ENCOUNTER — Ambulatory Visit (INDEPENDENT_AMBULATORY_CARE_PROVIDER_SITE_OTHER): Payer: Medicare Other | Admitting: Family Medicine

## 2016-01-06 VITALS — BP 150/90 | Temp 102.0°F | Ht 67.75 in | Wt 203.2 lb

## 2016-01-06 DIAGNOSIS — J111 Influenza due to unidentified influenza virus with other respiratory manifestations: Secondary | ICD-10-CM

## 2016-01-06 MED ORDER — HYDROCODONE-HOMATROPINE 5-1.5 MG/5ML PO SYRP: 5.0000 mL | ORAL_SOLUTION | Freq: Four times a day (QID) | ORAL | Status: DC | PRN

## 2016-01-06 MED ORDER — OSELTAMIVIR PHOSPHATE 75 MG PO CAPS: 75.0000 mg | ORAL_CAPSULE | Freq: Two times a day (BID) | ORAL | Status: DC

## 2016-01-06 NOTE — Patient Instructions (Signed)

## 2016-01-06 NOTE — Progress Notes (Signed)
   Subjective:    Patient ID: Cameron Smith, male    DOB: Apr 19, 1941, 75 y.o.   MRN: [id number]  Cough This is a new problem. The current episode started yesterday. Associated symptoms include chills, a fever, headaches, myalgias, nasal congestion, rhinorrhea and a sore throat. Pertinent negatives include no chest pain, ear pain or wheezing. Treatments tried: mucinex.   Patient states no other concerns this visit.  this patient started yesterday with some runny nose fatigue tiredness then progressed into sinus pressure pain discomfort not feeling well body aches as well hot and cold with chills today  Review of Systems  Constitutional: Positive for fever and chills. Negative for activity change.  HENT: Positive for congestion, rhinorrhea and sore throat. Negative for ear pain.   Eyes: Negative for discharge.  Respiratory: Positive for cough. Negative for wheezing.   Cardiovascular: Negative for chest pain.  Musculoskeletal: Positive for myalgias.  Neurological: Positive for headaches.       Objective:   Physical Exam  Constitutional: He appears well-developed.  HENT:  Head: Normocephalic.  Mouth/Throat: Oropharynx is clear and moist. No oropharyngeal exudate.  Neck: Normal range of motion.  Cardiovascular: Normal rate, regular rhythm and normal heart sounds.   No murmur heard. Pulmonary/Chest: Effort normal and breath sounds normal. He has no wheezes.  Lymphadenopathy:    He has no cervical adenopathy.  Neurological: He exhibits normal muscle tone.  Skin: Skin is warm and dry.  Nursing note and vitals reviewed.    the patient does not appear toxic but he does look like he feels bad      Assessment & Plan:  Influenza-the patient was diagnosed with influenza. Patient/family educated about the flu and warning signs to watch for. If difficulty breathing, severe neck pain and stiffness, cyanosis, disorientation, or progressive worsening then immediately get rechecked at that ER.  If progressive symptoms be certain to be rechecked. Supportive measures such as Tylenol/ibuprofen was discussed. No aspirin use in children. And influenza home care instruction sheet was given.

## 2016-01-18 ENCOUNTER — Other Ambulatory Visit: Payer: Self-pay | Admitting: *Deleted

## 2016-01-18 MED ORDER — ATORVASTATIN CALCIUM 20 MG PO TABS: ORAL_TABLET | ORAL | Status: DC

## 2016-01-18 MED ORDER — LEVOTHYROXINE SODIUM 88 MCG PO TABS: ORAL_TABLET | ORAL | Status: DC

## 2016-01-27 ENCOUNTER — Telehealth: Payer: Self-pay | Admitting: Family Medicine

## 2016-01-27 DIAGNOSIS — I10 Essential (primary) hypertension: Secondary | ICD-10-CM

## 2016-01-27 DIAGNOSIS — E785 Hyperlipidemia, unspecified: Secondary | ICD-10-CM

## 2016-01-27 DIAGNOSIS — R7301 Impaired fasting glucose: Secondary | ICD-10-CM

## 2016-01-27 NOTE — Telephone Encounter (Signed)
Blood work ordered in EPIC. Patient notified. 

## 2016-01-27 NOTE — Telephone Encounter (Signed)
Pt would like to get this done in the morning. He is going to be gone fishing next week.

## 2016-01-27 NOTE — Telephone Encounter (Signed)
Patient had Lipid, Liver, glucose, TSH on 07/28/15

## 2016-01-27 NOTE — Telephone Encounter (Signed)
Patient needs order for blood work. °

## 2016-01-27 NOTE — Telephone Encounter (Signed)
Lipid, liver, metabolic 7, A1c 

## 2016-01-27 NOTE — Telephone Encounter (Signed)
Seeing Dr Richardson Landry next week

## 2016-01-28 ENCOUNTER — Encounter: Payer: Medicare Other | Admitting: Family Medicine

## 2016-01-28 DIAGNOSIS — I10 Essential (primary) hypertension: Secondary | ICD-10-CM | POA: Diagnosis not present

## 2016-01-28 DIAGNOSIS — R7301 Impaired fasting glucose: Secondary | ICD-10-CM | POA: Diagnosis not present

## 2016-01-28 DIAGNOSIS — E785 Hyperlipidemia, unspecified: Secondary | ICD-10-CM | POA: Diagnosis not present

## 2016-01-29 LAB — LIPID PANEL
CHOL/HDL RATIO: 4.4 ratio (ref 0.0–5.0)
Cholesterol, Total: 169 mg/dL (ref 100–199)
HDL: 38 mg/dL — AB (ref >39)
LDL Calculated: 109 mg/dL — ABNORMAL HIGH (ref 0–99)
Triglycerides: 109 mg/dL (ref 0–149)
VLDL CHOLESTEROL CAL: 22 mg/dL (ref 5–40)

## 2016-01-29 LAB — BASIC METABOLIC PANEL
BUN/Creatinine Ratio: 19 (ref 10–22)
BUN: 15 mg/dL (ref 8–27)
CO2: 25 mmol/L (ref 18–29)
CREATININE: 0.81 mg/dL (ref 0.76–1.27)
Calcium: 9.6 mg/dL (ref 8.6–10.2)
Chloride: 104 mmol/L (ref 96–106)
GFR calc Af Amer: 101 mL/min/{1.73_m2} (ref >59)
GFR calc non Af Amer: 88 mL/min/{1.73_m2} (ref >59)
GLUCOSE: 95 mg/dL (ref 65–99)
Potassium: 4.8 mmol/L (ref 3.5–5.2)
SODIUM: 142 mmol/L (ref 134–144)

## 2016-01-29 LAB — HEPATIC FUNCTION PANEL
ALK PHOS: 57 IU/L (ref 39–117)
ALT: 21 IU/L (ref 0–44)
AST: 21 IU/L (ref 0–40)
Albumin: 4.3 g/dL (ref 3.5–4.8)
Bilirubin Total: 0.7 mg/dL (ref 0.0–1.2)
Bilirubin, Direct: 0.16 mg/dL (ref 0.00–0.40)
TOTAL PROTEIN: 6.3 g/dL (ref 6.0–8.5)

## 2016-01-29 LAB — HEMOGLOBIN A1C
Est. average glucose Bld gHb Est-mCnc: 131 mg/dL
Hgb A1c MFr Bld: 6.2 % — ABNORMAL HIGH (ref 4.8–5.6)

## 2016-02-03 ENCOUNTER — Ambulatory Visit (INDEPENDENT_AMBULATORY_CARE_PROVIDER_SITE_OTHER): Payer: Medicare Other | Admitting: Family Medicine

## 2016-02-03 ENCOUNTER — Encounter: Payer: Self-pay | Admitting: Family Medicine

## 2016-02-03 VITALS — BP 148/94 | Ht 67.5 in | Wt 204.0 lb

## 2016-02-03 DIAGNOSIS — E039 Hypothyroidism, unspecified: Secondary | ICD-10-CM | POA: Diagnosis not present

## 2016-02-03 DIAGNOSIS — Z0000 Encounter for general adult medical examination without abnormal findings: Secondary | ICD-10-CM

## 2016-02-03 DIAGNOSIS — E785 Hyperlipidemia, unspecified: Secondary | ICD-10-CM

## 2016-02-03 DIAGNOSIS — I10 Essential (primary) hypertension: Secondary | ICD-10-CM | POA: Diagnosis not present

## 2016-02-03 DIAGNOSIS — Z125 Encounter for screening for malignant neoplasm of prostate: Secondary | ICD-10-CM | POA: Diagnosis not present

## 2016-02-03 MED ORDER — LISINOPRIL 20 MG PO TABS: ORAL_TABLET | ORAL | Status: DC

## 2016-02-03 MED ORDER — ATORVASTATIN CALCIUM 20 MG PO TABS: ORAL_TABLET | ORAL | Status: DC

## 2016-02-03 MED ORDER — LEVOTHYROXINE SODIUM 88 MCG PO TABS: ORAL_TABLET | ORAL | Status: DC

## 2016-02-03 NOTE — Progress Notes (Signed)
Subjective:    Patient ID: Cameron Smith, male    DOB: [birth year], 75 y.o.   MRN: [id number]  HPI AWV- Annual Wellness Visit  The patient was seen for their annual wellness visit. The patient's past medical history, surgical history, and family history were reviewed. Pertinent vaccines were reviewed ( tetanus, pneumonia, shingles, flu) The patient's medication list was reviewed and updated.  The height and weight were entered. The patient's current BMI is: 31.48  Cognitive screening was completed. Outcome of Mini - Cog: pass  Falls within the past 6 months: none  Current tobacco usage: none (All patients who use tobacco were given written and verbal information on quitting)  Recent listing of emergency department/hospitalizations over the past year were reviewed.  current specialist the patient sees on a regular basis: none   Medicare annual wellness visit patient questionnaire was reviewed.  A written screening schedule for the patient for the next 5-10 years was given. Appropriate discussion of followup regarding next visit was discussed.   Results for orders placed or performed in visit on 01/27/16  Lipid panel  Result Value Ref Range   Cholesterol, Total 169 100 - 199 mg/dL   Triglycerides 109 0 - 149 mg/dL   HDL 38 (L) >39 mg/dL   VLDL Cholesterol Cal 22 5 - 40 mg/dL   LDL Calculated 109 (H) 0 - 99 mg/dL   Chol/HDL Ratio 4.4 0.0 - 5.0 ratio units  Hepatic function panel  Result Value Ref Range   Total Protein 6.3 6.0 - 8.5 g/dL   Albumin 4.3 3.5 - 4.8 g/dL   Bilirubin Total 0.7 0.0 - 1.2 mg/dL   Bilirubin, Direct 0.16 0.00 - 0.40 mg/dL   Alkaline Phosphatase 57 39 - 117 IU/L   AST 21 0 - 40 IU/L   ALT 21 0 - 44 IU/L  Basic metabolic panel  Result Value Ref Range   Glucose 95 65 - 99 mg/dL   BUN 15 8 - 27 mg/dL   Creatinine, Ser 0.81 0.76 - 1.27 mg/dL   GFR calc non Af Amer 88 >59 mL/min/1.73   GFR calc Af Amer 101 >59 mL/min/1.73   BUN/Creatinine Ratio 19  10 - 22   Sodium 142 134 - 144 mmol/L   Potassium 4.8 3.5 - 5.2 mmol/L   Chloride 104 96 - 106 mmol/L   CO2 25 18 - 29 mmol/L   Calcium 9.6 8.6 - 10.2 mg/dL  Hemoglobin A1c  Result Value Ref Range   Hgb A1c MFr Bld 6.2 (H) 4.8 - 5.6 %   Est. average glucose Bld gHb Est-mCnc 131 mg/dL    BP numbers at home generally good, normally at home numb are 130 over 70 ish    patient compliant with lipids. Does not miss a dose. No obvious side effects medicine.  Patient compliant with thyroid medicine. No symptoms of high or low thyroid.   Review of Systems  Constitutional: Negative for fever, activity change and appetite change.  HENT: Negative for congestion and rhinorrhea.   Eyes: Negative for discharge.  Respiratory: Negative for cough and wheezing.   Cardiovascular: Negative for chest pain.  Gastrointestinal: Negative for vomiting, abdominal pain and blood in stool.  Genitourinary: Negative for frequency and difficulty urinating.  Musculoskeletal: Negative for neck pain.  Skin: Negative for rash.  Allergic/Immunologic: Negative for environmental allergies and food allergies.  Neurological: Negative for weakness and headaches.  Psychiatric/Behavioral: Negative for agitation.  All other systems reviewed and are negative.  Objective:   Physical Exam  Constitutional: He appears well-developed and well-nourished.  HENT:  Head: Normocephalic and atraumatic.  Right Ear: External ear normal.  Left Ear: External ear normal.  Nose: Nose normal.  Mouth/Throat: Oropharynx is clear and moist.  Eyes: EOM are normal. Pupils are equal, round, and reactive to light.  Neck: Normal range of motion. Neck supple. No thyromegaly present.  Cardiovascular: Normal rate, regular rhythm and normal heart sounds.   No murmur heard. Pulmonary/Chest: Effort normal and breath sounds normal. No respiratory distress. He has no wheezes.  Abdominal: Soft. Bowel sounds are normal. He exhibits no  distension and no mass. There is no tenderness.  Genitourinary: Penis normal.  Musculoskeletal: Normal range of motion. He exhibits no edema.  Lymphadenopathy:    He has no cervical adenopathy.  Neurological: He is alert. He exhibits normal muscle tone.  Skin: Skin is warm and dry. No erythema.  Psychiatric: He has a normal mood and affect. His behavior is normal. Judgment normal.  Vitals reviewed.         Assessment & Plan:  Impression well at all exam. Up-to-date on colonoscopy. #2 hyperlipidemia good control meds discussed maintain same #3 hypothyroidism good control plan Hemoccult cards. PSA. Medications refilled. Diet exercise discussed. Recheck in 6 months. WSL

## 2016-02-04 LAB — PSA: Prostate Specific Ag, Serum: 0.4 ng/mL (ref 0.0–4.0)

## 2016-02-06 ENCOUNTER — Encounter: Payer: Self-pay | Admitting: Family Medicine

## 2016-02-14 ENCOUNTER — Other Ambulatory Visit: Payer: Self-pay

## 2016-02-14 DIAGNOSIS — Z0000 Encounter for general adult medical examination without abnormal findings: Secondary | ICD-10-CM

## 2016-02-14 LAB — POC HEMOCCULT BLD/STL (HOME/3-CARD/SCREEN)
FECAL OCCULT BLD: NEGATIVE
FECAL OCCULT BLD: NEGATIVE
FECAL OCCULT BLD: NEGATIVE

## 2016-06-21 ENCOUNTER — Other Ambulatory Visit: Payer: Self-pay | Admitting: Family Medicine

## 2016-07-10 ENCOUNTER — Other Ambulatory Visit: Payer: Self-pay | Admitting: Dermatology

## 2016-07-10 DIAGNOSIS — L719 Rosacea, unspecified: Secondary | ICD-10-CM | POA: Diagnosis not present

## 2016-07-10 DIAGNOSIS — D239 Other benign neoplasm of skin, unspecified: Secondary | ICD-10-CM | POA: Diagnosis not present

## 2016-07-10 DIAGNOSIS — L821 Other seborrheic keratosis: Secondary | ICD-10-CM | POA: Diagnosis not present

## 2016-07-10 DIAGNOSIS — D0439 Carcinoma in situ of skin of other parts of face: Secondary | ICD-10-CM | POA: Diagnosis not present

## 2016-07-18 ENCOUNTER — Other Ambulatory Visit: Payer: Self-pay

## 2016-07-21 ENCOUNTER — Telehealth: Payer: Self-pay | Admitting: *Deleted

## 2016-07-21 DIAGNOSIS — I10 Essential (primary) hypertension: Secondary | ICD-10-CM

## 2016-07-21 DIAGNOSIS — E785 Hyperlipidemia, unspecified: Secondary | ICD-10-CM

## 2016-07-21 DIAGNOSIS — E039 Hypothyroidism, unspecified: Secondary | ICD-10-CM

## 2016-07-21 DIAGNOSIS — R7301 Impaired fasting glucose: Secondary | ICD-10-CM

## 2016-07-21 NOTE — Telephone Encounter (Signed)
Lip liv glu tsh 

## 2016-07-21 NOTE — Telephone Encounter (Signed)
Blood work ordered in EPIC. Patient notified. 

## 2016-07-21 NOTE — Telephone Encounter (Signed)
Requesting lab orders for upcoming appt

## 2016-07-26 DIAGNOSIS — E785 Hyperlipidemia, unspecified: Secondary | ICD-10-CM | POA: Diagnosis not present

## 2016-07-26 DIAGNOSIS — I10 Essential (primary) hypertension: Secondary | ICD-10-CM | POA: Diagnosis not present

## 2016-07-26 DIAGNOSIS — R7301 Impaired fasting glucose: Secondary | ICD-10-CM | POA: Diagnosis not present

## 2016-07-26 DIAGNOSIS — E039 Hypothyroidism, unspecified: Secondary | ICD-10-CM | POA: Diagnosis not present

## 2016-07-27 LAB — HEPATIC FUNCTION PANEL
ALBUMIN: 4.4 g/dL (ref 3.5–4.8)
ALK PHOS: 62 IU/L (ref 39–117)
ALT: 24 IU/L (ref 0–44)
AST: 22 IU/L (ref 0–40)
BILIRUBIN TOTAL: 0.7 mg/dL (ref 0.0–1.2)
BILIRUBIN, DIRECT: 0.15 mg/dL (ref 0.00–0.40)
TOTAL PROTEIN: 6.4 g/dL (ref 6.0–8.5)

## 2016-07-27 LAB — LIPID PANEL
CHOLESTEROL TOTAL: 195 mg/dL (ref 100–199)
Chol/HDL Ratio: 5 ratio units (ref 0.0–5.0)
HDL: 39 mg/dL — ABNORMAL LOW (ref >39)
LDL Calculated: 119 mg/dL — ABNORMAL HIGH (ref 0–99)
Triglycerides: 183 mg/dL — ABNORMAL HIGH (ref 0–149)
VLDL CHOLESTEROL CAL: 37 mg/dL (ref 5–40)

## 2016-07-27 LAB — GLUCOSE, RANDOM: Glucose: 102 mg/dL — ABNORMAL HIGH (ref 65–99)

## 2016-07-27 LAB — TSH: TSH: 2.48 u[IU]/mL (ref 0.450–4.500)

## 2016-08-04 ENCOUNTER — Ambulatory Visit (INDEPENDENT_AMBULATORY_CARE_PROVIDER_SITE_OTHER): Payer: Medicare Other | Admitting: Family Medicine

## 2016-08-04 ENCOUNTER — Encounter: Payer: Self-pay | Admitting: Family Medicine

## 2016-08-04 VITALS — BP 130/80 | Ht 68.0 in | Wt 205.4 lb

## 2016-08-04 DIAGNOSIS — E039 Hypothyroidism, unspecified: Secondary | ICD-10-CM | POA: Diagnosis not present

## 2016-08-04 DIAGNOSIS — R7301 Impaired fasting glucose: Secondary | ICD-10-CM

## 2016-08-04 DIAGNOSIS — I10 Essential (primary) hypertension: Secondary | ICD-10-CM

## 2016-08-04 DIAGNOSIS — E785 Hyperlipidemia, unspecified: Secondary | ICD-10-CM

## 2016-08-04 DIAGNOSIS — Z23 Encounter for immunization: Secondary | ICD-10-CM

## 2016-08-04 MED ORDER — ATORVASTATIN CALCIUM 20 MG PO TABS: 20.0000 mg | ORAL_TABLET | Freq: Every day | ORAL | 1 refills | Status: DC

## 2016-08-04 MED ORDER — LISINOPRIL 20 MG PO TABS: 20.0000 mg | ORAL_TABLET | Freq: Every day | ORAL | 1 refills | Status: DC

## 2016-08-04 MED ORDER — LEVOTHYROXINE SODIUM 88 MCG PO TABS: 88.0000 ug | ORAL_TABLET | Freq: Every day | ORAL | 1 refills | Status: DC

## 2016-08-04 NOTE — Progress Notes (Signed)
   Subjective:    Patient ID: Cameron Smith, male    DOB: 19-Nov-1941, 75 y.o.   MRN: [id number] Patient arrives office for follow-up of multiple concerns Hyperlipidemia  This is a chronic problem. The current episode started more than 1 year ago. There are no compliance problems.    Patient continues to take lipid medication regularly. No obvious side effects from it. Generally does not miss a dose. Prior blood work results are reviewed with patient. Patient continues to work on fat intake in diet  Blood pressure medicine and blood pressure levels reviewed today with patient. Compliant with blood pressure medicine. States does not miss a dose. No obvious side effects. Blood pressure generally good when checked elsewhere. Watching salt intake.   Results for orders placed or performed in visit on 07/21/16  Lipid panel  Result Value Ref Range   Cholesterol, Total 195 100 - 199 mg/dL   Triglycerides 183 (H) 0 - 149 mg/dL   HDL 39 (L) >39 mg/dL   VLDL Cholesterol Cal 37 5 - 40 mg/dL   LDL Calculated 119 (H) 0 - 99 mg/dL   Chol/HDL Ratio 5.0 0.0 - 5.0 ratio units  Hepatic function panel  Result Value Ref Range   Total Protein 6.4 6.0 - 8.5 g/dL   Albumin 4.4 3.5 - 4.8 g/dL   Bilirubin Total 0.7 0.0 - 1.2 mg/dL   Bilirubin, Direct 0.15 0.00 - 0.40 mg/dL   Alkaline Phosphatase 62 39 - 117 IU/L   AST 22 0 - 40 IU/L   ALT 24 0 - 44 IU/L  Glucose, random  Result Value Ref Range   Glucose 102 (H) 65 - 99 mg/dL  TSH  Result Value Ref Range   TSH 2.480 0.450 - 4.500 uIU/mL   Watches diet fairly well, mposdtly eating the right   Patient continues to take lipid medication regularly. No obvious side effects from it. Generally does not miss a dose. Prior blood work results are reviewed with patient. Patient continues to work on fat intake in diet  .Blood pressure medicine and blood pressure levels reviewed today with patient. Compliant with blood pressure medicine. States does not miss a dose.  No obvious side effects. Blood pressure generally good when checked elsewhere. Watching salt intake.  walj[place] trail twice per day   Patient claims compliance with her thyroid medication. No symptoms of high or low thyroid. Watching diet closely   High numbers often between 170 and 180 Patient states no other concerns this visit  Review of Systems No headache, no major weight loss or weight gain, no chest pain no back pain abdominal pain no change in bowel habits complete ROS otherwise negative     Objective:   Physical Exam   Alert vitals stable, NAD. Blood pressure good on repeat. HEENT normal. Lungs clear. Heart regular rate and rhythm.  thyroid nonpalpable.     Assessment & PlanImpression 1 hypertension good control discussed maintain same meds #2 hyperlipidemia good control discussed maintain same #3 hypothyroidism. Good control discussed maintain same meds #4 impaired fasting glucose discussed stable plan flu shot today. Medications refilled. Diet exercise discussed. Check every 6 months WSL:

## 2016-08-21 ENCOUNTER — Encounter: Payer: Self-pay | Admitting: Family Medicine

## 2016-08-21 ENCOUNTER — Ambulatory Visit (INDEPENDENT_AMBULATORY_CARE_PROVIDER_SITE_OTHER): Payer: Medicare Other | Admitting: Family Medicine

## 2016-08-21 VITALS — BP 146/82 | Temp 99.5°F | Ht 68.0 in | Wt 206.2 lb

## 2016-08-21 DIAGNOSIS — M25521 Pain in right elbow: Secondary | ICD-10-CM | POA: Diagnosis not present

## 2016-08-21 DIAGNOSIS — L03113 Cellulitis of right upper limb: Secondary | ICD-10-CM

## 2016-08-21 MED ORDER — CEFTRIAXONE SODIUM 500 MG IJ SOLR
500.0000 mg | Freq: Once | INTRAMUSCULAR | Status: AC
  Administered 2016-08-21: 500 mg via INTRAMUSCULAR

## 2016-08-21 MED ORDER — DOXYCYCLINE HYCLATE 100 MG PO TABS
ORAL_TABLET | ORAL | 0 refills | Status: DC
Start: 2016-08-21 — End: 2016-08-30

## 2016-08-21 NOTE — Progress Notes (Signed)
   Subjective:    Patient ID: Cameron Smith, male    DOB: 1941/06/19, 75 y.o.   MRN: [id number]  HPI  Patient in today for right elbow pain with possible infection. Onset last Wednesday. Has tried ice and heat to site.  Patient originally injured. Created a blister. Then developed pain swelling redness. Has noted no fever or chills States no other concerns this visit.   Review of Systems No headache, no major weight loss or weight gain, no chest pain no back pain abdominal pain no change in bowel habits complete ROS otherwise negative     Objective:   Physical Exam Alert vitals stable, NAD. Blood pressure good on repeat. HEENT normal. Lungs clear. Heart regular rate and rhythm. Left elbow swollen distinct erythema tender to palpation       Assessment & Plan:  Impression elbow cellulitis with element of BURSITIS PLAN antibiotics prescribed. Doxycycline twice a day 10 days. Rocephin injection. Warning signs discussed carefully. Expect slow resolution

## 2016-08-21 NOTE — Patient Instructions (Signed)
This is called cellulitis which is infection under the skin  Can occur after  A skin injury or blister  It can be plain old staph aureus or a tougher bacteria like MRSA, but i'm not sayinf it is MRSA  There is also some olecranon bursitis, but we try very hard not to lance it  We will treat this infection with shot and oral meds  If you start running high fevers or feeling very bad like you have the flu, during the day call us, or at night go the emergency room chance of that 2%

## 2016-08-24 DIAGNOSIS — D0439 Carcinoma in situ of skin of other parts of face: Secondary | ICD-10-CM | POA: Diagnosis not present

## 2016-08-30 ENCOUNTER — Encounter: Payer: Self-pay | Admitting: Family Medicine

## 2016-08-30 ENCOUNTER — Telehealth: Payer: Self-pay | Admitting: Family Medicine

## 2016-08-30 ENCOUNTER — Ambulatory Visit (INDEPENDENT_AMBULATORY_CARE_PROVIDER_SITE_OTHER): Payer: Medicare Other | Admitting: Family Medicine

## 2016-08-30 VITALS — BP 132/90 | Temp 99.0°F | Ht 67.75 in | Wt 204.0 lb

## 2016-08-30 DIAGNOSIS — L03113 Cellulitis of right upper limb: Secondary | ICD-10-CM | POA: Diagnosis not present

## 2016-08-30 MED ORDER — DOXYCYCLINE HYCLATE 100 MG PO TABS: ORAL_TABLET | ORAL | 0 refills | Status: DC

## 2016-08-30 NOTE — Telephone Encounter (Signed)
Transferred to front desk to schedule appointment.  

## 2016-08-30 NOTE — Telephone Encounter (Signed)
Patient seen for right arm cellulitis on 08/21/16. Prescribed Doxycyline. His arm is still tender and warm to touch. No other symptoms.

## 2016-08-30 NOTE — Telephone Encounter (Signed)
Pt called stating that he has one pill left of the Doxycycline. Pt states that his arm is still a little tender and warm to the touch. Pt wants to know if more needs to be called in. Pt is going out of town in the morning. Please advise.   Larene Pickett

## 2016-08-30 NOTE — Progress Notes (Signed)
   Subjective:    Patient ID: Cameron Smith, male    DOB: [birth year], 75 y.o.   MRN: [id number]  HPIREcheck right elbow. Seen last week. Will finish doxy tonight. Some better.  Patient took doxycycline he states it's gotten much better but he still has some redness no significant tenderness in the low bit of swelling in the olecranon region. Denies any stiffness in the elbow. Denies fever chills sweats nausea vomiting diarrhea   Review of Systems See above    Objective:   Physical Exam  Erythema of the elbow in addition to this there is a little bit of swelling in the olecranon bursa. It is not tender. I doubt that this is an abscess.      Assessment & Plan:  I would recommend additional round of antibiotics. Warm compresses on a frequent basis. Also recommended patient follow-up next week if not showing significant signs of improvement. Patient was told that if the elbow has significant swelling in the olecranon bursa he needs follow-up immediately may need referral to orthopedics for drainage if high fevers or if worse immediately seek help here or ER

## 2016-08-30 NOTE — Telephone Encounter (Signed)
Certainly he may well need further antibiotics. I believe it is wise to have this patient worked in in. Should not take more than a few minutes to look at his arm and advised him and make sure were on the right path. Please set patient up follow-up this afternoon

## 2016-09-08 DIAGNOSIS — H5203 Hypermetropia, bilateral: Secondary | ICD-10-CM | POA: Diagnosis not present

## 2016-09-08 DIAGNOSIS — H17821 Peripheral opacity of cornea, right eye: Secondary | ICD-10-CM | POA: Diagnosis not present

## 2016-09-08 DIAGNOSIS — H43813 Vitreous degeneration, bilateral: Secondary | ICD-10-CM | POA: Diagnosis not present

## 2016-09-08 DIAGNOSIS — H43393 Other vitreous opacities, bilateral: Secondary | ICD-10-CM | POA: Diagnosis not present

## 2016-10-11 ENCOUNTER — Encounter (HOSPITAL_COMMUNITY): Payer: Self-pay | Admitting: Emergency Medicine

## 2016-10-11 ENCOUNTER — Emergency Department (HOSPITAL_COMMUNITY): Payer: Medicare Other

## 2016-10-11 ENCOUNTER — Emergency Department (HOSPITAL_COMMUNITY)
Admission: EM | Admit: 2016-10-11 | Discharge: 2016-10-11 | Disposition: A | Discharge status: Home / Self Care | Payer: Medicare Other | Attending: Emergency Medicine | Admitting: Emergency Medicine

## 2016-10-11 DIAGNOSIS — S066X0A Traumatic subarachnoid hemorrhage without loss of consciousness, initial encounter: Secondary | ICD-10-CM | POA: Diagnosis not present

## 2016-10-11 DIAGNOSIS — Z7982 Long term (current) use of aspirin: Secondary | ICD-10-CM | POA: Insufficient documentation

## 2016-10-11 DIAGNOSIS — Y9389 Activity, other specified: Secondary | ICD-10-CM | POA: Diagnosis not present

## 2016-10-11 DIAGNOSIS — Y929 Unspecified place or not applicable: Secondary | ICD-10-CM | POA: Insufficient documentation

## 2016-10-11 DIAGNOSIS — S0291XA Unspecified fracture of skull, initial encounter for closed fracture: Secondary | ICD-10-CM

## 2016-10-11 DIAGNOSIS — Z79899 Other long term (current) drug therapy: Secondary | ICD-10-CM | POA: Insufficient documentation

## 2016-10-11 DIAGNOSIS — Y999 Unspecified external cause status: Secondary | ICD-10-CM | POA: Diagnosis not present

## 2016-10-11 DIAGNOSIS — S0990XA Unspecified injury of head, initial encounter: Secondary | ICD-10-CM | POA: Diagnosis present

## 2016-10-11 DIAGNOSIS — Z87891 Personal history of nicotine dependence: Secondary | ICD-10-CM | POA: Diagnosis not present

## 2016-10-11 DIAGNOSIS — I609 Nontraumatic subarachnoid hemorrhage, unspecified: Secondary | ICD-10-CM

## 2016-10-11 DIAGNOSIS — S020XXA Fracture of vault of skull, initial encounter for closed fracture: Secondary | ICD-10-CM | POA: Insufficient documentation

## 2016-10-11 DIAGNOSIS — S065XAA Traumatic subdural hemorrhage with loss of consciousness status unknown, initial encounter: Secondary | ICD-10-CM

## 2016-10-11 DIAGNOSIS — E039 Hypothyroidism, unspecified: Secondary | ICD-10-CM | POA: Diagnosis not present

## 2016-10-11 DIAGNOSIS — W11XXXA Fall on and from ladder, initial encounter: Secondary | ICD-10-CM | POA: Insufficient documentation

## 2016-10-11 DIAGNOSIS — S065X0A Traumatic subdural hemorrhage without loss of consciousness, initial encounter: Secondary | ICD-10-CM | POA: Insufficient documentation

## 2016-10-11 DIAGNOSIS — S065X9A Traumatic subdural hemorrhage with loss of consciousness of unspecified duration, initial encounter: Secondary | ICD-10-CM

## 2016-10-11 DIAGNOSIS — I10 Essential (primary) hypertension: Secondary | ICD-10-CM | POA: Diagnosis not present

## 2016-10-11 DIAGNOSIS — S066X1A Traumatic subarachnoid hemorrhage with loss of consciousness of 30 minutes or less, initial encounter: Secondary | ICD-10-CM | POA: Diagnosis not present

## 2016-10-11 DIAGNOSIS — S065X1A Traumatic subdural hemorrhage with loss of consciousness of 30 minutes or less, initial encounter: Secondary | ICD-10-CM | POA: Diagnosis not present

## 2016-10-11 DIAGNOSIS — S069X9A Unspecified intracranial injury with loss of consciousness of unspecified duration, initial encounter: Secondary | ICD-10-CM | POA: Diagnosis not present

## 2016-10-11 NOTE — ED Notes (Signed)
Received report on pt, pt alert, able to answer all questions, denies any complaints, cms intact all extremities,

## 2016-10-11 NOTE — ED Notes (Signed)
ED Provider at bedside. 

## 2016-10-11 NOTE — ED Triage Notes (Signed)
Pt reports he slipped off a ladder and hit his head causing him to lose consciousness. Pt ambulatory to Triage. Pt states he takes an 81 mg ASA but no other blood thinners. Pt denies dizziness or nausea.

## 2016-10-11 NOTE — Discharge Instructions (Signed)
Stop taking aspirin for two weeks. You mat take tylenol as needed for pain. Avoid NSAIDs like ibuprofen, aleve, naprosyn, etc.

## 2016-10-20 NOTE — ED Provider Notes (Signed)
[facility] DEPT Provider Note   CSN: [id number] Arrival date & time: 10/11/16  1719     History   Chief Complaint Chief Complaint  Patient presents with  . Loss of Consciousness    HPI Cameron Smith is a 75 y.o. male.  HPI   75 year old male presenting for evaluation after slip and fall from a ladder while building a shed for a [place]. He did strike the back of his head. He was briefly unconscious for a couple minutes. He takes a baby aspirin. Denies any acute neurological complaints. No nausea or vomiting. Does have a hematoma to the back of his head. Acting normally per his family at bedside  Past Medical History:  Diagnosis Date  . Anxiety   . Hypercholesteremia   . Hypertension   . Hypothyroidism   . Reactive airways dysfunction syndrome     Patient Active Problem List   Diagnosis Date Noted  . Essential hypertension, benign 07/08/2013  . Hyperlipidemia LDL goal <130 07/08/2013  . Impaired fasting glucose 07/08/2013  . Hypothyroidism 07/08/2013  . HIP PAIN 11/08/2009  . [place] DISEASE, LUMBOSACRAL SPINE 11/08/2009  . SCIATICA 11/08/2009  . BACK PAIN 11/08/2009    Past Surgical History:  Procedure Laterality Date  . BACK SURGERY    . COLONOSCOPY  10/31/2011   Procedure: COLONOSCOPY;  Surgeon: Daneil Dolin, MD;  Location: [facility];  Service: Endoscopy;  Laterality: N/A;  10:30 AM  . NASAL SINUS SURGERY         Home Medications    Prior to Admission medications   Medication Sig Start Date End Date Taking? Authorizing Provider  aspirin 81 MG tablet Take 81 mg by mouth at bedtime.    Yes Historical Provider, MD  atorvastatin (LIPITOR) 20 MG tablet Take 1 tablet (20 mg total) by mouth daily. 08/04/16  Yes Mikey Kirschner, MD  co-enzyme Q-10 30 MG capsule Take 30 mg by mouth daily.   Yes Historical Provider, MD  levothyroxine (SYNTHROID, LEVOTHROID) 88 MCG tablet Take 1 tablet (88 mcg total) by mouth daily. 08/04/16  Yes Mikey Kirschner,  MD  lisinopril (PRINIVIL,ZESTRIL) 20 MG tablet Take 1 tablet (20 mg total) by mouth daily. 08/04/16  Yes Mikey Kirschner, MD  vitamin E 400 UNIT capsule Take 400 Units by mouth daily.   Yes Historical Provider, MD  doxycycline (VIBRA-TABS) 100 MG tablet Take 1 tablet by mouth twice a day for 10 days. 08/30/16   Kathyrn Drown, MD    Family History Family History  Problem Relation Age of Onset  . Heart disease Father   . Diabetes Sister   . Colon cancer Neg Hx     Social History Social History  Substance Use Topics  . Smoking status: Former Smoker    Packs/day: 3.00    Years: 20.00  . Smokeless tobacco: Never Used  . Alcohol use No     Allergies   Flomax [tamsulosin hcl]   Review of Systems Review of Systems  All systems reviewed and negative, other than as noted in HPI.   Physical Exam Updated Vital Signs BP 179/76   Pulse 68   Temp 97.5 F (36.4 C) (Oral)   Resp 23   Ht 5\' 8"  (1.727 m)   Wt 200 lb (90.7 kg)   SpO2 91%   BMI 30.41 kg/m   Physical Exam  Constitutional: He is oriented to person, place, and time. He appears well-developed and well-nourished. No distress.  HENT:  Head: Normocephalic.  Hematoma to the occipital region.  Eyes: Conjunctivae are normal. Right eye exhibits no discharge. Left eye exhibits no discharge.  Neck: Neck supple.  Cardiovascular: Normal rate, regular rhythm and normal heart sounds.  Exam reveals no gallop and no friction rub.   No murmur heard. Pulmonary/Chest: Effort normal and breath sounds normal. No respiratory distress.  Abdominal: Soft. He exhibits no distension. There is no tenderness.  Musculoskeletal: He exhibits no edema or tenderness.  No midline spinal tenderness  Neurological: He is alert and oriented to person, place, and time. No cranial nerve deficit or sensory deficit. He exhibits normal muscle tone. Coordination normal.  Speech clear. Content appropriate. Follows commands. Cranial nerves II through XII  are intact. Strength is 5 out of 5 bilateral upper lower extremities. Sensation is intact to light touch. Steady gait.  Skin: Skin is warm and dry.  Psychiatric: He has a normal mood and affect. His behavior is normal. Thought content normal.  Nursing note and vitals reviewed.    ED Treatments / Results  Labs (all labs ordered are listed, but only abnormal results are displayed) Labs Reviewed - No data to display  EKG  EKG Interpretation None       Radiology No results found.   Ct Head Wo Contrast  Result Date: 10/11/2016 CLINICAL DATA:  Slipped off ladder, hit posterior head. Loss of consciousness. On aspirin. History of hypertension hypercholesterolemia. EXAM: CT HEAD WITHOUT CONTRAST TECHNIQUE: Contiguous axial images were obtained from the base of the skull through the vertex without intravenous contrast. COMPARISON:  None. FINDINGS: BRAIN: The ventricles and sulci are normal for age. No intraparenchymal hemorrhage, mass effect nor midline shift. Patchy supratentorial white matter hypodensities less than expected for patient's age, though non-specific are most compatible with chronic small vessel ischemic disease. No acute large vascular territory infarcts. 2 mm dense RIGHT parafalcine subdural hematoma. Small amount of subarachnoid hemorrhage along the Anteroinferior interhemispheric fissure. Basal cisterns are patent. VASCULAR: Moderate calcific atherosclerosis of the carotid siphons. SKULL: Linear lucency midline frontal calvarium and contiguous with the metopic suture without thighs stasis. Large midline posterior scalp hematoma without subcutaneous gas or radiopaque foreign bodies. SINUSES/ORBITS: Mild paranasal sinus mucosal thickening. Minimal RIGHT mastoid effusion. The included ocular globes and orbital contents are non-suspicious. OTHER: Patient is edentulous. Miller IMPRESSION: Trace RIGHT parafalcine acute subdural hematoma. Minimal interhemispheric fissure subarachnoid  hemorrhage. Acute nondisplaced frontal skull fracture, less likely persistent metopic suture. Large posterior scalp hematoma. Acute findings discussed with and reconfirmed by Dr.Miller on 10/11/2016 at 7:18 pm. Electronically Signed   By: Elon Alas M.D.   On: 10/11/2016 19:19    Procedures Procedures (including critical care time)  Medications Ordered in ED Medications - No data to display   Initial Impression / Assessment and Plan / ED Course  I have reviewed the triage vital signs and the nursing notes.  Pertinent labs & imaging results that were available during my care of the patient were reviewed by me and considered in my medical decision making (see chart for details).  Clinical Course     75 year old male status post fall. Imaging as above. He actually appears very well. Clinically I question the degree of a skull fracture. He really has pretty minimal scalp/skull tenderness. His neuro exam is nonfocal. Imaging was reviewed with neurosurgery. Will hold aspirin. Follow-up as outpatient with neurosurgery. Return precautions were discussed.  Final Clinical Impressions(s) / ED Diagnoses   Final diagnoses:  SDH (subdural hematoma) (HCC)  SAH (subarachnoid  hemorrhage) ([place])  Closed fracture of skull, unspecified bone, initial encounter [facility])    New Prescriptions Discharge Medication List as of 10/11/2016  7:59 PM       Virgel Manifold, MD 10/20/16 [phone]

## 2016-11-01 ENCOUNTER — Ambulatory Visit (INDEPENDENT_AMBULATORY_CARE_PROVIDER_SITE_OTHER): Payer: Medicare Other | Admitting: Family Medicine

## 2016-11-01 ENCOUNTER — Encounter: Payer: Self-pay | Admitting: Family Medicine

## 2016-11-01 VITALS — BP 126/78 | Ht 67.75 in | Wt 201.6 lb

## 2016-11-01 DIAGNOSIS — S060X9D Concussion with loss of consciousness of unspecified duration, subsequent encounter: Secondary | ICD-10-CM | POA: Diagnosis not present

## 2016-11-01 NOTE — Progress Notes (Signed)
   Subjective:    Patient ID: Cameron Smith, male    DOB: 1941/07/14, 75 y.o.   MRN: [id number]  HPI Patient arrives for a ER follow up. Patient fell off a ladder and hit his head the day before Thanksgiving-had a CT that showed some blood on the brain. Patient states he lost his sense of taste and smell and it has not returned. Patient wants to know when he can start his baby asa back they stopped in ER that night. Also having right shoulder pain since fall.  Complete emergency room report reviewed in presence of patient also imaging from scan reviewed.  Patient suffered a fall off a ladder. Next  He was completely knocked out.  Scan reveals very thin subdural hematoma. Next  Patient's on low-dose aspirin he's been on it for many many years. We did the American Heart Association scoring which stated there is insufficient evidence to maintain aspirin. Discussed.  Scan also revealed a suggestion of a frontal fracture.  Patient has lost much of his sense of smell and taste since the injury. He states there has been slight improvement Review of Systems No headache, no major weight loss or weight gain, no chest pain no back pain abdominal pain no change in bowel habits complete ROS otherwise negative     Objective:   Physical Exam Alert vitals stable, NAD. Blood pressure good on repeat. HEENT normal. Lungs clear. Heart regular rate and rhythm. Neuro exam intact no focal deficits       Assessment & Plan:  Impression concussion with very thin subdural hematoma, and now loss of smell and secondarily taste. Long discussion held. 30% incidence of loss of smell with concussion. Plan known need for further scans at this time. Discussed. Encouraged to stay off aspirin. If patient insists on getting back would wait at least a month. Easily 25 minutes spent most in discussion

## 2016-11-01 NOTE — Patient Instructions (Signed)
Start your baby aspirin back after thirty days from the injury +

## 2016-11-08 DIAGNOSIS — L309 Dermatitis, unspecified: Secondary | ICD-10-CM | POA: Diagnosis not present

## 2016-11-08 DIAGNOSIS — L570 Actinic keratosis: Secondary | ICD-10-CM | POA: Diagnosis not present

## 2016-11-08 DIAGNOSIS — Z85828 Personal history of other malignant neoplasm of skin: Secondary | ICD-10-CM | POA: Diagnosis not present

## 2016-12-28 ENCOUNTER — Other Ambulatory Visit: Payer: Self-pay | Admitting: Family Medicine

## 2017-02-05 ENCOUNTER — Telehealth: Payer: Self-pay | Admitting: Family Medicine

## 2017-02-05 DIAGNOSIS — R7301 Impaired fasting glucose: Secondary | ICD-10-CM

## 2017-02-05 DIAGNOSIS — E785 Hyperlipidemia, unspecified: Secondary | ICD-10-CM

## 2017-02-05 DIAGNOSIS — E039 Hypothyroidism, unspecified: Secondary | ICD-10-CM

## 2017-02-05 DIAGNOSIS — I10 Essential (primary) hypertension: Secondary | ICD-10-CM

## 2017-02-05 DIAGNOSIS — Z125 Encounter for screening for malignant neoplasm of prostate: Secondary | ICD-10-CM

## 2017-02-05 NOTE — Telephone Encounter (Signed)
Lip liv m7 tsh psa 

## 2017-02-05 NOTE — Telephone Encounter (Signed)
Blood work ordered in EPIC. Patient notified. 

## 2017-02-05 NOTE — Telephone Encounter (Signed)
Requesting order for blood work for appointment on 02/09/17 with Dr. Richardson Landry.

## 2017-02-06 DIAGNOSIS — E785 Hyperlipidemia, unspecified: Secondary | ICD-10-CM | POA: Diagnosis not present

## 2017-02-06 DIAGNOSIS — I10 Essential (primary) hypertension: Secondary | ICD-10-CM | POA: Diagnosis not present

## 2017-02-06 DIAGNOSIS — E039 Hypothyroidism, unspecified: Secondary | ICD-10-CM | POA: Diagnosis not present

## 2017-02-06 DIAGNOSIS — Z125 Encounter for screening for malignant neoplasm of prostate: Secondary | ICD-10-CM | POA: Diagnosis not present

## 2017-02-06 DIAGNOSIS — R7301 Impaired fasting glucose: Secondary | ICD-10-CM | POA: Diagnosis not present

## 2017-02-07 LAB — LIPID PANEL
Chol/HDL Ratio: 5.4 ratio units — ABNORMAL HIGH (ref 0.0–5.0)
Cholesterol, Total: 217 mg/dL — ABNORMAL HIGH (ref 100–199)
HDL: 40 mg/dL (ref >39)
LDL CALC: 130 mg/dL — AB (ref 0–99)
TRIGLYCERIDES: 233 mg/dL — AB (ref 0–149)
VLDL Cholesterol Cal: 47 mg/dL — ABNORMAL HIGH (ref 5–40)

## 2017-02-07 LAB — BASIC METABOLIC PANEL
BUN / CREAT RATIO: 15 (ref 10–24)
BUN: 14 mg/dL (ref 8–27)
CHLORIDE: 100 mmol/L (ref 96–106)
CO2: 27 mmol/L (ref 18–29)
Calcium: 10 mg/dL (ref 8.6–10.2)
Creatinine, Ser: 0.93 mg/dL (ref 0.76–1.27)
GFR calc non Af Amer: 80 mL/min/{1.73_m2} (ref >59)
GFR, EST AFRICAN AMERICAN: 93 mL/min/{1.73_m2} (ref >59)
GLUCOSE: 104 mg/dL — AB (ref 65–99)
Potassium: 5.3 mmol/L — ABNORMAL HIGH (ref 3.5–5.2)
Sodium: 143 mmol/L (ref 134–144)

## 2017-02-07 LAB — HEPATIC FUNCTION PANEL
ALT: 29 IU/L (ref 0–44)
AST: 22 IU/L (ref 0–40)
Albumin: 4.6 g/dL (ref 3.5–4.8)
Alkaline Phosphatase: 65 IU/L (ref 39–117)
BILIRUBIN TOTAL: 0.7 mg/dL (ref 0.0–1.2)
BILIRUBIN, DIRECT: 0.17 mg/dL (ref 0.00–0.40)
Total Protein: 7 g/dL (ref 6.0–8.5)

## 2017-02-07 LAB — TSH: TSH: 4.22 u[IU]/mL (ref 0.450–4.500)

## 2017-02-07 LAB — PSA: Prostate Specific Ag, Serum: 0.5 ng/mL (ref 0.0–4.0)

## 2017-02-09 ENCOUNTER — Telehealth: Payer: Self-pay | Admitting: Family Medicine

## 2017-02-09 ENCOUNTER — Encounter: Payer: Self-pay | Admitting: Family Medicine

## 2017-02-09 ENCOUNTER — Ambulatory Visit (INDEPENDENT_AMBULATORY_CARE_PROVIDER_SITE_OTHER): Payer: Medicare Other | Admitting: Family Medicine

## 2017-02-09 ENCOUNTER — Ambulatory Visit (HOSPITAL_COMMUNITY)
Admission: RE | Admit: 2017-02-09 | Discharge: 2017-02-09 | Disposition: A | Discharge status: Home / Self Care | Payer: Medicare Other | Source: Ambulatory Visit | Attending: Family Medicine | Admitting: Family Medicine

## 2017-02-09 ENCOUNTER — Encounter: Payer: Self-pay | Admitting: Neurology

## 2017-02-09 VITALS — BP 154/88 | Ht 67.5 in | Wt 208.0 lb

## 2017-02-09 DIAGNOSIS — M25511 Pain in right shoulder: Secondary | ICD-10-CM | POA: Diagnosis not present

## 2017-02-09 DIAGNOSIS — R432 Parageusia: Secondary | ICD-10-CM | POA: Diagnosis not present

## 2017-02-09 DIAGNOSIS — Z0000 Encounter for general adult medical examination without abnormal findings: Secondary | ICD-10-CM | POA: Diagnosis not present

## 2017-02-09 DIAGNOSIS — I10 Essential (primary) hypertension: Secondary | ICD-10-CM | POA: Diagnosis not present

## 2017-02-09 DIAGNOSIS — R7301 Impaired fasting glucose: Secondary | ICD-10-CM | POA: Diagnosis not present

## 2017-02-09 DIAGNOSIS — E039 Hypothyroidism, unspecified: Secondary | ICD-10-CM

## 2017-02-09 DIAGNOSIS — S060X9D Concussion with loss of consciousness of unspecified duration, subsequent encounter: Secondary | ICD-10-CM

## 2017-02-09 MED ORDER — ATORVASTATIN CALCIUM 20 MG PO TABS: 20.0000 mg | ORAL_TABLET | Freq: Every day | ORAL | 1 refills | Status: DC

## 2017-02-09 MED ORDER — LISINOPRIL 20 MG PO TABS: 20.0000 mg | ORAL_TABLET | Freq: Every day | ORAL | 1 refills | Status: DC

## 2017-02-09 MED ORDER — LEVOTHYROXINE SODIUM 88 MCG PO TABS: 88.0000 ug | ORAL_TABLET | Freq: Every day | ORAL | 1 refills | Status: DC

## 2017-02-09 NOTE — Progress Notes (Signed)
Subjective:    Patient ID: Cameron Smith, male    DOB: 1941-08-16, 76 y.o.   MRN: [id number]  HPI AWV- Annual Wellness Visit  The patient was seen for their annual wellness visit. The patient's past medical history, surgical history, and family history were reviewed. Pertinent vaccines were reviewed ( tetanus, pneumonia, shingles, flu) The patient's medication list was reviewed and updated.  The height and weight were entered. The patient's current BMI is: 32.10  Cognitive screening was completed. Outcome of Mini - Cog: pass  Falls within the past 6 months: fell in November. Still has not gotten back smell or taste since the fall. Fell when ladder broke.   Current tobacco usage: none (All patients who use tobacco were given written and verbal information on quitting)  Recent listing of emergency department/hospitalizations over the past year were reviewed.  current specialist the patient sees on a regular basis: none   Medicare annual wellness visit patient questionnaire was reviewed.  A written screening schedule for the patient for the next 5-10 years was given. Appropriate discussion of followup regarding next visit was discussed.  Results for orders placed or performed in visit on 02/05/17  Lipid panel  Result Value Ref Range   Cholesterol, Total 217 (H) 100 - 199 mg/dL   Triglycerides 233 (H) 0 - 149 mg/dL   HDL 40 >39 mg/dL   VLDL Cholesterol Cal 47 (H) 5 - 40 mg/dL   LDL Calculated 130 (H) 0 - 99 mg/dL   Chol/HDL Ratio 5.4 (H) 0.0 - 5.0 ratio units  Hepatic function panel  Result Value Ref Range   Total Protein 7.0 6.0 - 8.5 g/dL   Albumin 4.6 3.5 - 4.8 g/dL   Bilirubin Total 0.7 0.0 - 1.2 mg/dL   Bilirubin, Direct 0.17 0.00 - 0.40 mg/dL   Alkaline Phosphatase 65 39 - 117 IU/L   AST 22 0 - 40 IU/L   ALT 29 0 - 44 IU/L  Basic metabolic panel  Result Value Ref Range   Glucose 104 (H) 65 - 99 mg/dL   BUN 14 8 - 27 mg/dL   Creatinine, Ser 0.93 0.76 - 1.27 mg/dL     GFR calc non Af Amer 80 >59 mL/min/1.73   GFR calc Af Amer 93 >59 mL/min/1.73   BUN/Creatinine Ratio 15 10 - 24   Sodium 143 134 - 144 mmol/L   Potassium 5.3 (H) 3.5 - 5.2 mmol/L   Chloride 100 96 - 106 mmol/L   CO2 27 18 - 29 mmol/L   Calcium 10.0 8.6 - 10.2 mg/dL  TSH  Result Value Ref Range   TSH 4.220 0.450 - 4.500 uIU/mL  PSA  Result Value Ref Range   Prostate Specific Ag, Serum 0.5 0.0 - 4.0 ng/mL    Blood pressure medicine and blood pressure levels reviewed today with patient. Compliant with blood pressure medicine. States does not miss a dose. No obvious side effects. Blood pressure generally good when checked elsewhere. Watching salt intake. Review blood pressures showed numbers good here for the past year  Patient notes right shoulder pain. Since the fall. Worse with certain motions. Quite uncomfortable at times. Patient concerned about it.  Ongoing loss of smell. See prior note. Occurred after skull fracture and small subdural hematoma. Discussed at that time. That it small proportion of patients experience this type of injury lose their smell permanently. Patient also frustrated about this.   Review of Systems  Constitutional: Negative for activity change, appetite change and  fever.  HENT: Negative for congestion and rhinorrhea.   Eyes: Negative for discharge.  Respiratory: Negative for cough and wheezing.   Cardiovascular: Negative for chest pain.  Gastrointestinal: Negative for abdominal pain, blood in stool and vomiting.  Genitourinary: Negative for difficulty urinating and frequency.  Musculoskeletal: Negative for neck pain.  Skin: Negative for rash.  Allergic/Immunologic: Negative for environmental allergies and food allergies.  Neurological: Negative for weakness and headaches.  Psychiatric/Behavioral: Negative for agitation.  All other systems reviewed and are negative.      Objective:   Physical Exam  Constitutional: He appears well-developed and  well-nourished.  HENT:  Head: Normocephalic and atraumatic.  Right Ear: External ear normal.  Left Ear: External ear normal.  Nose: Nose normal.  Mouth/Throat: Oropharynx is clear and moist.  Eyes: EOM are normal. Pupils are equal, round, and reactive to light.  Neck: Normal range of motion. Neck supple. No thyromegaly present.  Cardiovascular: Normal rate, regular rhythm and normal heart sounds.   No murmur heard. Pulmonary/Chest: Effort normal and breath sounds normal. No respiratory distress. He has no wheezes.  Abdominal: Soft. Bowel sounds are normal. He exhibits no distension and no mass. There is no tenderness.  Genitourinary: Penis normal.  Musculoskeletal: Normal range of motion. He exhibits no edema.  Right shoulder positive impingement sign no focal tenderness positive limitation of range of motion. Distal arm strength sensation intact  Lymphadenopathy:    He has no cervical adenopathy.  Neurological: He is alert. He exhibits normal muscle tone.  No focal neurological deficits. Loss of smell noted.  Skin: Skin is warm and dry. No erythema.  Psychiatric: He has a normal mood and affect. His behavior is normal. Judgment normal.  Vitals reviewed.         Assessment & Plan:  Impression 1 wellness exam diet discuss exercise discussed mental status is discussed. Anticipatory guidance discussed immunizations discussed #2 hypertension suboptimal however numbers have been good throughout ear will hold off on increasing medicine for now #3 hypothyroidism good control discussed will maintain same meds #4 impingement sign right shoulder. Probable rotator cuff. Discussed. Patient wishes to hold off on orthopedic visit now. We'll do shoulder x-ray. #5 loss of smell. Concerning. May well be permanent. Patient not thrilled to hear this. Long discussion held. Patient would like to see a neurologist for second opinion #6 impaired fasting glucose/prediabetes ongoing discuss number still  elevated but stable plan diet exercise discussed. Right shoulder x-ray. Medications refilled. Neurology referral. Recheck in 6 months. WSL

## 2017-02-09 NOTE — Telephone Encounter (Signed)
Spoke with patient and informed him that we sent in prescriptions to [facility] pharmacy. Patient verbalized understanding.

## 2017-02-09 NOTE — Telephone Encounter (Signed)
Patient had medications called in to [facility] today after his visit, but they were supposed to be sent to [facility].  Please advise.

## 2017-02-09 NOTE — Patient Instructions (Signed)
Results for orders placed or performed in visit on 02/05/17  Lipid panel  Result Value Ref Range   Cholesterol, Total 217 (H) 100 - 199 mg/dL   Triglycerides 233 (H) 0 - 149 mg/dL   HDL 40 >39 mg/dL   VLDL Cholesterol Cal 47 (H) 5 - 40 mg/dL   LDL Calculated 130 (H) 0 - 99 mg/dL   Chol/HDL Ratio 5.4 (H) 0.0 - 5.0 ratio units  Hepatic function panel  Result Value Ref Range   Total Protein 7.0 6.0 - 8.5 g/dL   Albumin 4.6 3.5 - 4.8 g/dL   Bilirubin Total 0.7 0.0 - 1.2 mg/dL   Bilirubin, Direct 0.17 0.00 - 0.40 mg/dL   Alkaline Phosphatase 65 39 - 117 IU/L   AST 22 0 - 40 IU/L   ALT 29 0 - 44 IU/L  Basic metabolic panel  Result Value Ref Range   Glucose 104 (H) 65 - 99 mg/dL   BUN 14 8 - 27 mg/dL   Creatinine, Ser 0.93 0.76 - 1.27 mg/dL   GFR calc non Af Amer 80 >59 mL/min/1.73   GFR calc Af Amer 93 >59 mL/min/1.73   BUN/Creatinine Ratio 15 10 - 24   Sodium 143 134 - 144 mmol/L   Potassium 5.3 (H) 3.5 - 5.2 mmol/L   Chloride 100 96 - 106 mmol/L   CO2 27 18 - 29 mmol/L   Calcium 10.0 8.6 - 10.2 mg/dL  TSH  Result Value Ref Range   TSH 4.220 0.450 - 4.500 uIU/mL  PSA  Result Value Ref Range   Prostate Specific Ag, Serum 0.5 0.0 - 4.0 ng/mL

## 2017-04-03 DIAGNOSIS — R430 Anosmia: Secondary | ICD-10-CM | POA: Diagnosis not present

## 2017-04-17 ENCOUNTER — Encounter: Payer: Self-pay | Admitting: Neurology

## 2017-04-17 ENCOUNTER — Ambulatory Visit (INDEPENDENT_AMBULATORY_CARE_PROVIDER_SITE_OTHER): Payer: Medicare Other | Admitting: Neurology

## 2017-04-17 VITALS — BP 162/90 | HR 72 | Ht 68.0 in | Wt 206.0 lb

## 2017-04-17 DIAGNOSIS — R430 Anosmia: Secondary | ICD-10-CM | POA: Diagnosis not present

## 2017-04-17 DIAGNOSIS — Z87828 Personal history of other (healed) physical injury and trauma: Secondary | ICD-10-CM | POA: Insufficient documentation

## 2017-04-17 NOTE — Patient Instructions (Signed)
1. Schedule MRI brain without contrast with cuts through the sinuses 2. Continue nasal spray and zinc, follow-up with Dr. Janace Hoard as scheduled 3. Our office will call with the results, if unremarkable, follow-up on as needed basis

## 2017-04-17 NOTE — Progress Notes (Signed)
NEUROLOGY CONSULTATION NOTE  Cameron Smith MRN: [id number] DOB: Dec 31, 1940  Referring provider: Dr. Baltazar Apo Primary care provider: Dr. Baltazar Apo  Reason for consult:  concussion  Dear Dr Wolfgang Phoenix:  Thank you for your kind referral of Cameron Smith for consultation of the above symptoms. Although his history is well known to you, please allow me to reiterate it for the purpose of our medical record. The patient was accompanied to the clinic by his wife who also provides collateral information. Records and images were personally reviewed where available.  HISTORY OF PRESENT ILLNESS: This is a very pleasant 76 year old left-handed man with a history of hypertension, hyperlipidemia, hypothyroidism, presenting for evaluation of change in sense of taste and smell after a fall last November 2017. He fell off a ladder and lost consciousness for a brief amount of time. He woke up to his son-in-law calling EMS. He was brought to [facility] where head CT without contrast showed a small 65mm dense right parafalcine subdural hematoma and small subarachnoid hemorrhage along the anterior inferior interhemispheric fissure. There was note of an acute nondisplaced frontal skull fracture and a large posterior scalp hematoma. He denied any significant headaches but recalls waking up the next day unable to taste his breakfast. He cannot smell anything, be describes dysosmia where he smells a burnish kind of smell that is worse when he wakes up in the morning or after eating. He wakes up every morning with his throat completely dry where he feels he cannot move his tongue. He can detect salty, sweet, and sour tastes, but cannot tell what the salt is in. He saw Dr. Janace Hoard with ENT, per patient nasal scope did not show anything out of place. He was given a nasal spray and zinc which he started 2 weeks ago, and has not noticed much improvement in symptoms.  He has occasional dizziness mostly when standing up quickly.  He denies any diplopia, dysarthria/dysphagia, neck/back pain, focal numbness/tingling/weakness, bowel/bladder dysfunction. When he initially fell, he had some neck pain and a sensation of vibration in his chest when he bends his head down. These have resolved. He reports his memory is good, "sharp as a tack." He denies getting lost driving, no missed bill payments or medications. He denies any other significant head injuries.   PAST MEDICAL HISTORY: Past Medical History:  Diagnosis Date  . Anxiety   . Hypercholesteremia   . Hypertension   . Hypothyroidism   . Reactive airways dysfunction syndrome     PAST SURGICAL HISTORY: Past Surgical History:  Procedure Laterality Date  . BACK SURGERY    . COLONOSCOPY  10/31/2011   Procedure: COLONOSCOPY;  Surgeon: Daneil Dolin, MD;  Location: [facility];  Service: Endoscopy;  Laterality: N/A;  10:30 AM  . NASAL SINUS SURGERY      MEDICATIONS: Current Outpatient Prescriptions on File Prior to Visit  Medication Sig Dispense Refill  . aspirin 81 MG tablet Take 81 mg by mouth at bedtime.     Marland Kitchen atorvastatin (LIPITOR) 20 MG tablet Take 1 tablet (20 mg total) by mouth daily. 90 tablet 1  . co-enzyme Q-10 30 MG capsule Take 30 mg by mouth daily.    Marland Kitchen levothyroxine (SYNTHROID, LEVOTHROID) 88 MCG tablet Take 1 tablet (88 mcg total) by mouth daily. 90 tablet 1  . lisinopril (PRINIVIL,ZESTRIL) 20 MG tablet Take 1 tablet (20 mg total) by mouth daily. 90 tablet 1  . vitamin E 400 UNIT capsule Take 400 Units  by mouth daily.     No current facility-administered medications on file prior to visit.     ALLERGIES: Allergies  Allergen Reactions  . Flomax [Tamsulosin Hcl]     Dizzy     FAMILY HISTORY: Family History  Problem Relation Age of Onset  . Heart disease Father   . Diabetes Sister   . Colon cancer Neg Hx     SOCIAL HISTORY: Social History   Social History  . Marital status: Married    Spouse name: N/A  . Number of children: N/A    . Years of education: N/A   Occupational History  . Not on file.   Social History Main Topics  . Smoking status: Former Smoker    Packs/day: 3.00    Years: 20.00  . Smokeless tobacco: Never Used  . Alcohol use No  . Drug use: No  . Sexual activity: Not on file   Other Topics Concern  . Not on file   Social History Narrative  . No narrative on file    REVIEW OF SYSTEMS: Constitutional: No fevers, chills, or sweats, no generalized fatigue, change in appetite Eyes: No visual changes, double vision, eye pain Ear, nose and throat: No hearing loss, ear pain, nasal congestion, sore throat Cardiovascular: No chest pain, palpitations Respiratory:  No shortness of breath at rest or with exertion, wheezes GastrointestinaI: No nausea, vomiting, diarrhea, abdominal pain, fecal incontinence Genitourinary:  No dysuria, urinary retention or frequency Musculoskeletal:  No neck pain, back pain Integumentary: No rash, pruritus, skin lesions Neurological: as above Psychiatric: No depression, insomnia, anxiety Endocrine: No palpitations, fatigue, diaphoresis, mood swings, change in appetite, change in weight, increased thirst Hematologic/Lymphatic:  No anemia, purpura, petechiae. Allergic/Immunologic: no itchy/runny eyes, nasal congestion, recent allergic reactions, rashes  PHYSICAL EXAM: Vitals:   04/17/17 0848  BP: (!) 162/90  Pulse: 72   General: No acute distress Head:  Normocephalic/atraumatic Eyes: Fundoscopic exam shows bilateral sharp discs, no vessel changes, exudates, or hemorrhages Neck: supple, no paraspinal tenderness, full range of motion Back: No paraspinal tenderness Heart: regular rate and rhythm Lungs: Clear to auscultation bilaterally. Vascular: No carotid bruits. Skin/Extremities: No rash, no edema Neurological Exam: Mental status: alert and oriented to person, place, and time, no dysarthria or aphasia, Fund of knowledge is appropriate.  Recent and remote memory  are intact.  Attention and concentration are normal.    Able to name objects and repeat phrases. Cranial nerves: CN I: anosmia to coffee in both nostrils CN II: pupils equal, round and reactive to light, visual fields intact, fundi unremarkable. CN III, IV, VI:  full range of motion, no nystagmus, no ptosis CN V: facial sensation intact CN VII: upper and lower face symmetric. Able to distinguish sweet and salty taste  CN VIII: hearing intact to finger rub CN IX, X: gag intact, uvula midline CN XI: sternocleidomastoid and trapezius muscles intact CN XII: tongue midline Bulk & Tone: normal, no fasciculations. Motor: 5/5 throughout with no pronator drift. Sensation: intact to light touch, cold, pin, vibration and joint position sense.  No extinction to double simultaneous stimulation.  Romberg test negative Deep Tendon Reflexes: +1 throughout, no ankle clonus Plantar responses: downgoing bilaterally Cerebellar: no incoordination on finger to nose, heel to shin. No dysdiadochokinesia Gait: narrow-based and steady, able to tandem walk adequately. Tremor: none  IMPRESSION: This is a very pleasant 76 year old left-handed man with a history of hypertension, hyperlipidemia, hypothyroidism, presenting with anosmia and hypogeusia after head injury last November 2017.  He is unable to smell but reports some phantosmia of smelling a "burnish" smell worse in the mornings and after meals. He had a small subdural and subarachnoid hemorrhage with no focal abnormalities on neurological exam except for anosmia. MRI brain without contrast with cuts through the sinuses will be ordered to assess for underlying structural abnormality. We discussed that around 30% of patients with significant head trauma can have loss of taste and smell, with low likelihood of recovery after 6 months. He has seen ENT and started on a nasal spray and zinc, continue ENT follow-up. Our office will call him with MRI results, if  unremarkable, he will follow-up on a prn basis and knows to call for any changes.   Thank you for allowing me to participate in the care of this patient. Please do not hesitate to call for any questions or concerns.   Ellouise Newer, M.D.  CC: Dr. Wolfgang Phoenix

## 2017-04-27 ENCOUNTER — Telehealth: Payer: Self-pay | Admitting: Neurology

## 2017-04-27 NOTE — Telephone Encounter (Signed)
Patient needs  To talk to someone about why [facility] has not called them for scan

## 2017-04-27 NOTE — Telephone Encounter (Signed)
Spoke with pt wife.  Let her know that I have talked with [facility] Scheduling.  Pt scheduled for MRI at [facility] on Monday, June 11th at 6pm.

## 2017-04-30 ENCOUNTER — Ambulatory Visit (HOSPITAL_COMMUNITY)
Admission: RE | Admit: 2017-04-30 | Discharge: 2017-04-30 | Disposition: A | Discharge status: Home / Self Care | Payer: Medicare Other | Source: Ambulatory Visit | Attending: Neurology | Admitting: Neurology

## 2017-04-30 DIAGNOSIS — Z87828 Personal history of other (healed) physical injury and trauma: Secondary | ICD-10-CM

## 2017-04-30 DIAGNOSIS — S0990XA Unspecified injury of head, initial encounter: Secondary | ICD-10-CM | POA: Diagnosis not present

## 2017-04-30 DIAGNOSIS — R430 Anosmia: Secondary | ICD-10-CM

## 2017-05-07 ENCOUNTER — Telehealth: Payer: Self-pay | Admitting: Neurology

## 2017-05-07 NOTE — Telephone Encounter (Signed)
PT called and said he has a question regarding his MRI

## 2017-05-08 NOTE — Telephone Encounter (Signed)
Returned call.  No answer.  LMOM letting pt know that I will call him when his MRI results are released to me and to contact our office if he has any further questions.

## 2017-05-10 ENCOUNTER — Telehealth: Payer: Self-pay

## 2017-05-10 NOTE — Telephone Encounter (Signed)
-----   Message from Cameron Sprang, MD sent at 05/09/2017  2:32 PM EDT ----- Pls let him know MRI brain looks good, no evidence of tumor, stroke, bleed, or scar injury. However, with small injuries from head trauma, loss of smell can still occur even with no changes seen on brain scan. Thanks

## 2017-05-10 NOTE — Telephone Encounter (Signed)
LMOM relaying message below.  

## 2017-07-25 ENCOUNTER — Other Ambulatory Visit: Payer: Self-pay | Admitting: Family Medicine

## 2017-07-25 NOTE — Telephone Encounter (Signed)
Last seen 02/09/17 

## 2017-07-26 ENCOUNTER — Telehealth: Payer: Self-pay | Admitting: *Deleted

## 2017-07-26 DIAGNOSIS — E7800 Pure hypercholesterolemia, unspecified: Secondary | ICD-10-CM

## 2017-07-26 DIAGNOSIS — Z79899 Other long term (current) drug therapy: Secondary | ICD-10-CM

## 2017-07-26 DIAGNOSIS — E039 Hypothyroidism, unspecified: Secondary | ICD-10-CM

## 2017-07-26 DIAGNOSIS — I10 Essential (primary) hypertension: Secondary | ICD-10-CM

## 2017-07-26 NOTE — Telephone Encounter (Signed)
Lipid, liver, metabolic 7, TSH 

## 2017-07-26 NOTE — Telephone Encounter (Signed)
Requesting bloodwork orders. Last labs 02/06/17 psa, tsh, bmp, liver, lipid

## 2017-07-27 NOTE — Telephone Encounter (Signed)
Patient is aware,and the blood work order sent.

## 2017-08-06 ENCOUNTER — Other Ambulatory Visit: Payer: Self-pay | Admitting: Family Medicine

## 2017-08-08 DIAGNOSIS — Z79899 Other long term (current) drug therapy: Secondary | ICD-10-CM | POA: Diagnosis not present

## 2017-08-08 DIAGNOSIS — E7800 Pure hypercholesterolemia, unspecified: Secondary | ICD-10-CM | POA: Diagnosis not present

## 2017-08-08 DIAGNOSIS — I10 Essential (primary) hypertension: Secondary | ICD-10-CM | POA: Diagnosis not present

## 2017-08-08 DIAGNOSIS — E039 Hypothyroidism, unspecified: Secondary | ICD-10-CM | POA: Diagnosis not present

## 2017-08-09 LAB — BASIC METABOLIC PANEL
BUN/Creatinine Ratio: 18 (ref 10–24)
BUN: 15 mg/dL (ref 8–27)
CALCIUM: 9.8 mg/dL (ref 8.6–10.2)
CO2: 25 mmol/L (ref 20–29)
Chloride: 101 mmol/L (ref 96–106)
Creatinine, Ser: 0.82 mg/dL (ref 0.76–1.27)
GFR calc Af Amer: 100 mL/min/{1.73_m2} (ref >59)
GFR calc non Af Amer: 87 mL/min/{1.73_m2} (ref >59)
GLUCOSE: 114 mg/dL — AB (ref 65–99)
POTASSIUM: 5.3 mmol/L — AB (ref 3.5–5.2)
SODIUM: 141 mmol/L (ref 134–144)

## 2017-08-09 LAB — LIPID PANEL
Chol/HDL Ratio: 5.5 ratio — ABNORMAL HIGH (ref 0.0–5.0)
Cholesterol, Total: 235 mg/dL — ABNORMAL HIGH (ref 100–199)
HDL: 43 mg/dL (ref >39)
LDL CALC: 151 mg/dL — AB (ref 0–99)
Triglycerides: 205 mg/dL — ABNORMAL HIGH (ref 0–149)
VLDL Cholesterol Cal: 41 mg/dL — ABNORMAL HIGH (ref 5–40)

## 2017-08-09 LAB — HEPATIC FUNCTION PANEL
ALK PHOS: 69 IU/L (ref 39–117)
ALT: 31 IU/L (ref 0–44)
AST: 26 IU/L (ref 0–40)
Albumin: 4.6 g/dL (ref 3.5–4.8)
BILIRUBIN TOTAL: 0.6 mg/dL (ref 0.0–1.2)
BILIRUBIN, DIRECT: 0.14 mg/dL (ref 0.00–0.40)
TOTAL PROTEIN: 6.8 g/dL (ref 6.0–8.5)

## 2017-08-09 LAB — TSH: TSH: 3.96 u[IU]/mL (ref 0.450–4.500)

## 2017-08-13 ENCOUNTER — Ambulatory Visit (INDEPENDENT_AMBULATORY_CARE_PROVIDER_SITE_OTHER): Payer: Medicare Other | Admitting: Family Medicine

## 2017-08-13 ENCOUNTER — Encounter: Payer: Self-pay | Admitting: Family Medicine

## 2017-08-13 VITALS — BP 152/88 | Ht 68.0 in | Wt 207.0 lb

## 2017-08-13 DIAGNOSIS — E039 Hypothyroidism, unspecified: Secondary | ICD-10-CM

## 2017-08-13 DIAGNOSIS — I10 Essential (primary) hypertension: Secondary | ICD-10-CM

## 2017-08-13 DIAGNOSIS — E7800 Pure hypercholesterolemia, unspecified: Secondary | ICD-10-CM | POA: Diagnosis not present

## 2017-08-13 DIAGNOSIS — Z23 Encounter for immunization: Secondary | ICD-10-CM | POA: Diagnosis not present

## 2017-08-13 DIAGNOSIS — R7303 Prediabetes: Secondary | ICD-10-CM

## 2017-08-13 DIAGNOSIS — R7301 Impaired fasting glucose: Secondary | ICD-10-CM

## 2017-08-13 DIAGNOSIS — R432 Parageusia: Secondary | ICD-10-CM

## 2017-08-13 LAB — POCT GLYCOSYLATED HEMOGLOBIN (HGB A1C): Hemoglobin A1C: 6.4

## 2017-08-13 MED ORDER — ATORVASTATIN CALCIUM 40 MG PO TABS: 40.0000 mg | ORAL_TABLET | Freq: Every day | ORAL | 1 refills | Status: DC

## 2017-08-13 MED ORDER — LISINOPRIL 40 MG PO TABS: 40.0000 mg | ORAL_TABLET | Freq: Every day | ORAL | 1 refills | Status: DC

## 2017-08-13 MED ORDER — LEVOTHYROXINE SODIUM 88 MCG PO TABS: ORAL_TABLET | ORAL | 1 refills | Status: DC

## 2017-08-13 NOTE — Progress Notes (Signed)
   Subjective:    Patient ID: Cameron Smith, male    DOB: Oct 25, 1941, 76 y.o.   MRN: [id number]  Hypertension  This is a chronic problem. Treatments tried: lisiniopril. Compliance problems include exercise and diet.    Elevated blood sugar on bloodwork.  Results for orders placed or performed in visit on 08/13/17  POCT glycosylated hemoglobin (Hb A1C)  Result Value Ref Range   Hemoglobin A1C 6.4    Patient continues to take lipid medication regularly. No obvious side effects from it. Generally does not miss a dose. Prior blood work results are reviewed with patient. Patient continues to work on fat intake in diet  Blood pressure medicine and blood pressure levels reviewed today with patient. Compliant with blood pressure medicine. States does not miss a dose. No obvious side effects. Blood pressure generally good when checked elsewhere. Watching salt intake.  Not exercising, at all, stays active, but as far as fociused exer Pt saw both neurologist and ent physician,  Continues to have substantial difficulties with though taste and smell. Discussed at length neurologist and ENT findings. Also reviewed in chart. Basically they had nothing add they do feel it is posttraumatic and there may not return  Flu vaccine today.  Pt states no concerns today.   BP at home, geenrlaly numbers are a bit on the high side  Review of Systems No headache, no major weight loss or weight gain, no chest pain no back pain abdominal pain no change in bowel habits complete ROS otherwise negative     Objective:   Physical Exam  Alert and oriented, vitals reviewed and stable, NAD ENT-TM's and ext canals WNL bilat via otoscopic exam Soft palate, tonsils and post pharynx WNL via oropharyngeal exam Neck-symmetric, no masses; thyroid nonpalpable and nontender Pulmonary-no tachypnea or accessory muscle use; Clear without wheezes via auscultation Card--no abnrml murmurs, rhythm reg and rate WNL Carotid pulses  symmetric, without bruits       Assessment & Plan:  Impression 1 hypertension discussed suboptimum control need increase medicine  #2 hyperlipidemia suboptimal control LDL rising meds reviewed maintain increased dose  #3 diminished smell and taste post traumatic subdural hematoma may or may not return discussed   #4 prediabetes. Patient getting close. 6.4 A1c discussed encouraged to work on things  #5 hypothyroidism TSH stable to maintain same meds proper use discussed including morning and take on empty stomach   Blood work reviewed. Diet exercise discussed. Medications adjusted. Flu shot today.

## 2017-08-16 DIAGNOSIS — H16223 Keratoconjunctivitis sicca, not specified as Sjogren's, bilateral: Secondary | ICD-10-CM | POA: Diagnosis not present

## 2017-10-31 DIAGNOSIS — L570 Actinic keratosis: Secondary | ICD-10-CM | POA: Diagnosis not present

## 2017-10-31 DIAGNOSIS — L409 Psoriasis, unspecified: Secondary | ICD-10-CM | POA: Diagnosis not present

## 2017-11-26 ENCOUNTER — Other Ambulatory Visit: Payer: Self-pay | Admitting: Family Medicine

## 2017-11-27 ENCOUNTER — Encounter: Payer: Self-pay | Admitting: Family Medicine

## 2017-11-27 ENCOUNTER — Ambulatory Visit (INDEPENDENT_AMBULATORY_CARE_PROVIDER_SITE_OTHER): Payer: Medicare Other | Admitting: Family Medicine

## 2017-11-27 VITALS — BP 172/90 | Temp 98.6°F | Ht 68.0 in | Wt 203.1 lb

## 2017-11-27 DIAGNOSIS — I10 Essential (primary) hypertension: Secondary | ICD-10-CM | POA: Diagnosis not present

## 2017-11-27 DIAGNOSIS — S060X9D Concussion with loss of consciousness of unspecified duration, subsequent encounter: Secondary | ICD-10-CM

## 2017-11-27 DIAGNOSIS — L03012 Cellulitis of left finger: Secondary | ICD-10-CM | POA: Diagnosis not present

## 2017-11-27 DIAGNOSIS — E7800 Pure hypercholesterolemia, unspecified: Secondary | ICD-10-CM | POA: Diagnosis not present

## 2017-11-27 MED ORDER — DOXYCYCLINE HYCLATE 100 MG PO TABS
100.0000 mg | ORAL_TABLET | Freq: Two times a day (BID) | ORAL | 0 refills | Status: DC
Start: 2017-11-27 — End: 2018-02-26

## 2017-11-27 NOTE — Progress Notes (Signed)
   Subjective:    Patient ID: Cameron Smith, male    DOB: 03/08/1941, 77 y.o.   MRN: [id number] Patient presents short-term visit supposedly one problem but has really about 4 different issues HPI Patient is here today with complaints of a swollen left hand index finger. He states he noticed the finger swelling yesterday. He states he noticed Sunday that there was a blister on that finger .   He does not know if they are related to each other.   He is also is having right ear pain that radiates down neck.  Worse since concussion over 1 year ago.  Also notes ongoing loss of taste and smell.  But has a bizarre taste in his mouth when he wakes up.  Wonders what is going on.  Reports progressive difficulty with erectile function.  Feels may be due to increased dose on statins and ACE inhibitors     Review of Systems No headache, no major weight loss or weight gain, no chest pain no back pain abdominal pain no change in bowel habits complete ROS otherwise negative     Objective:   Physical Exam Alert and oriented, vitals reviewed and stable, NAD ENT-TM's and ext canals WNL bilat via otoscopic exam Soft palate, tonsils and post pharynx WNL via oropharyngeal exam Neck-symmetric, no masses; thyroid nonpalpable and nontender Pulmonary-no tachypnea or accessory muscle use; Clear without wheezes via auscultation Card--no abnrml murmurs, rhythm reg and rate WNL Carotid pulses symmetric, without bruits Left finger erythematous tender central blister now blood sugar with radiating to hand       Assessment & Plan:  Impression 1 early cellulitis  finger and HEENT.  Doxy prescribed  2.  Chronic otalgia no clear etiology ENT referral offered patient to think about  3.  Taste and smell loss since concussion discussed at length  4.  Progressive erectile dysfunction.  Generally not associated with statins and ACE inhibitors of her patient return for visit to discuss further  Greater than 50% of  this 25 minute face to face visit was spent in counseling and discussion and coordination of care regarding the above diagnosis/diagnosies

## 2017-11-28 ENCOUNTER — Other Ambulatory Visit: Payer: Self-pay | Admitting: *Deleted

## 2017-11-28 MED ORDER — LISINOPRIL 40 MG PO TABS: 40.0000 mg | ORAL_TABLET | Freq: Every day | ORAL | 0 refills | Status: DC

## 2017-11-28 MED ORDER — ATORVASTATIN CALCIUM 40 MG PO TABS: 40.0000 mg | ORAL_TABLET | Freq: Every day | ORAL | 0 refills | Status: DC

## 2018-01-14 IMAGING — DX DG SHOULDER 2+V*R*
3 series · 3 of 3 positions shown · non-contrast
Comparison: None.

CLINICAL DATA: Acute RIGHT shoulder pain, fell in [REDACTED]

EXAM:
RIGHT SHOULDER - 2+ VIEW

[shoulder grashey]
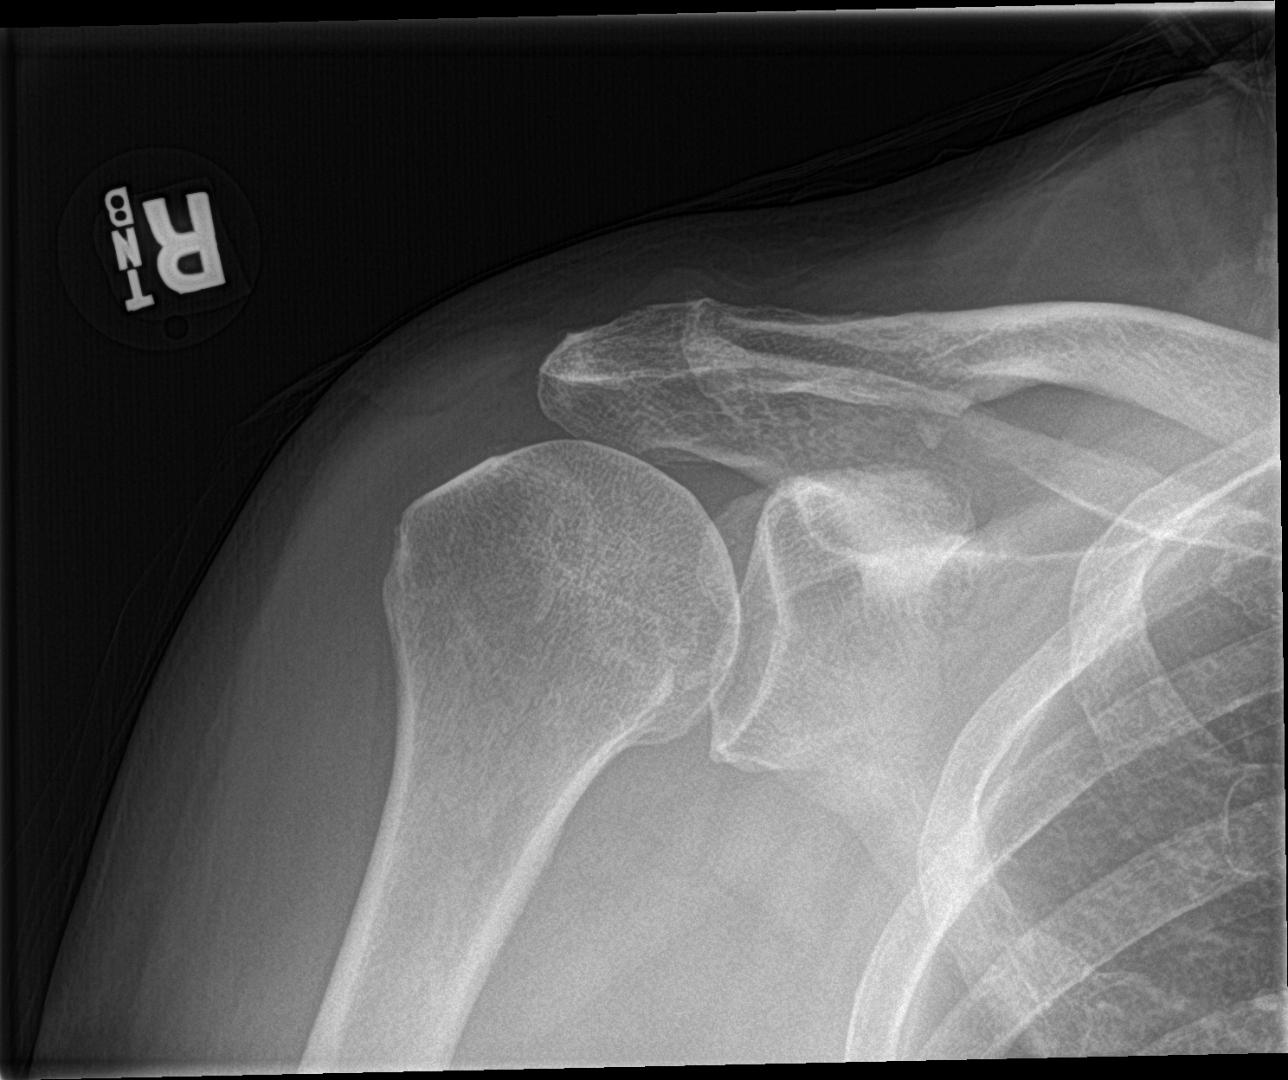

[shoulder y view]
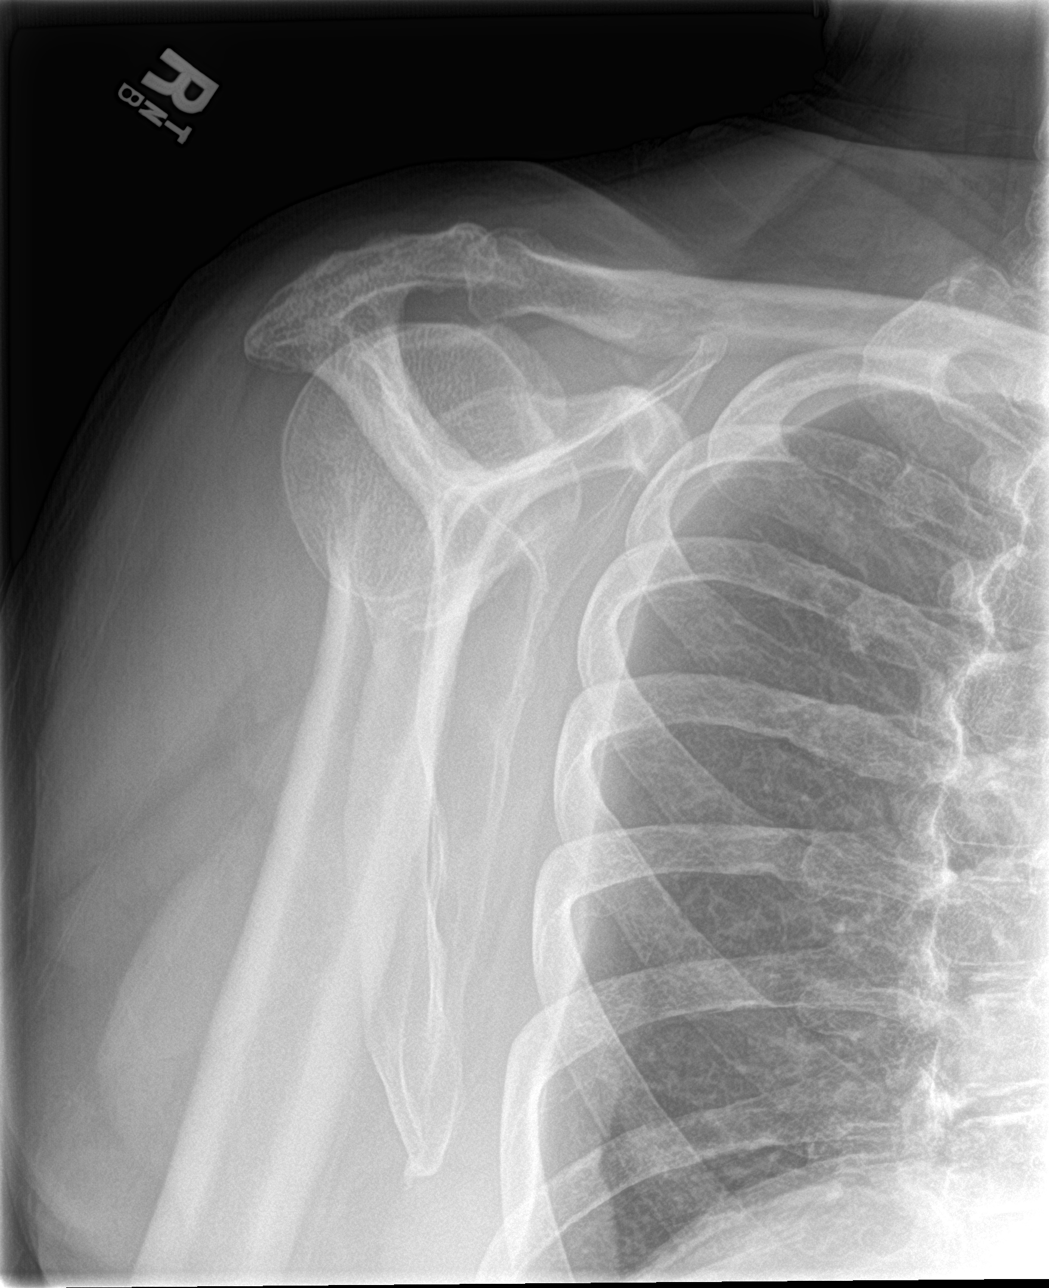

[shoulder axillary]
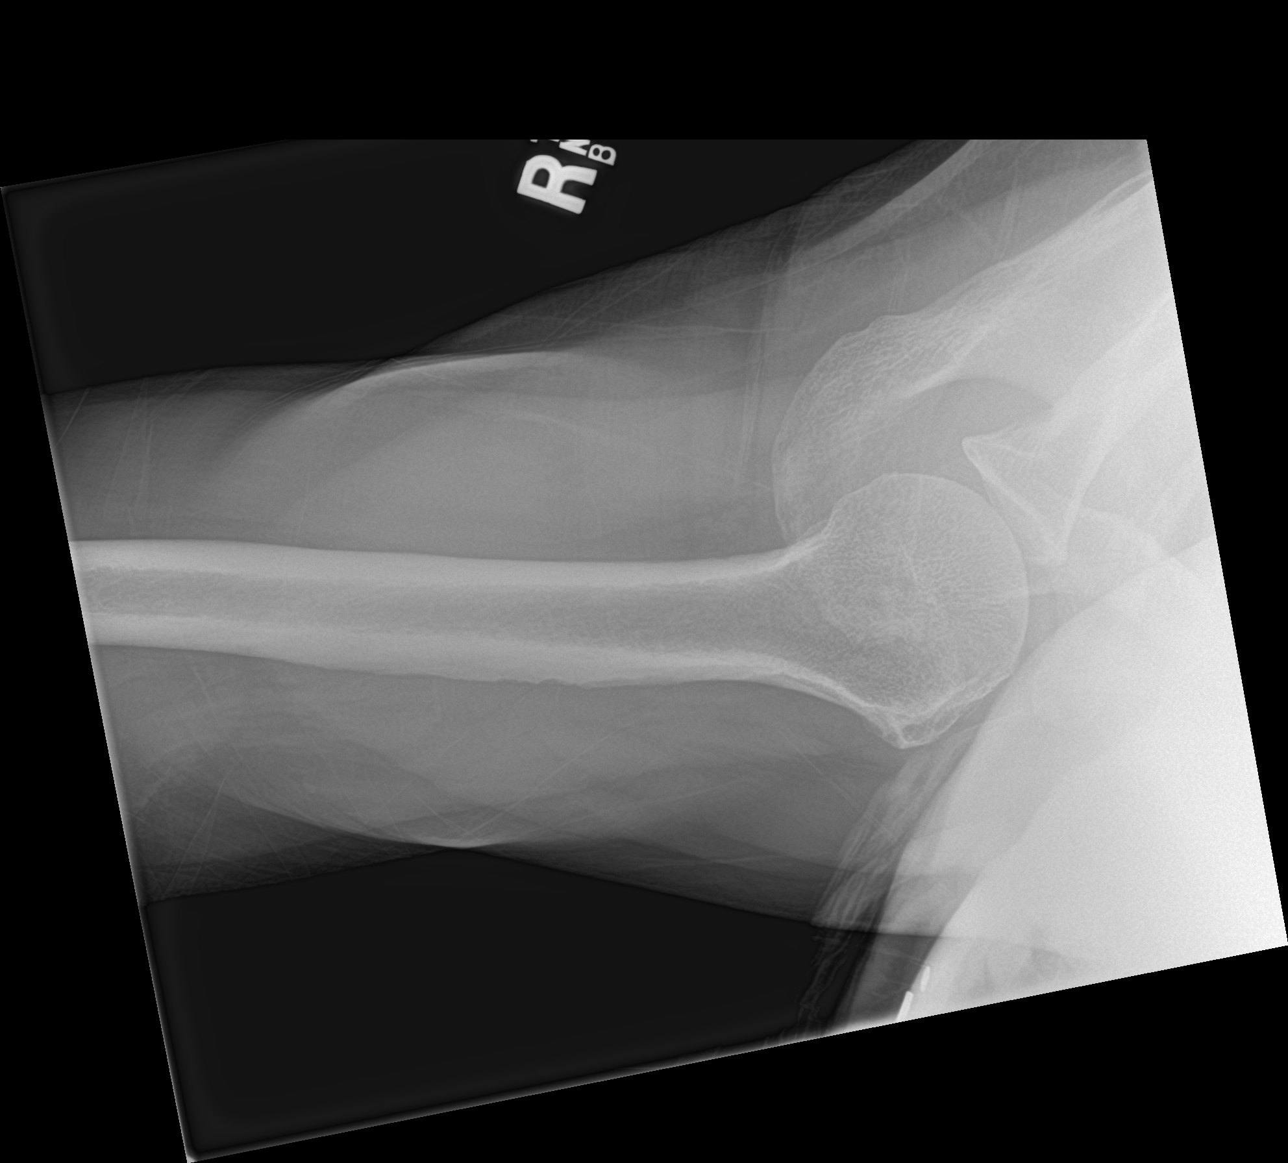

[3 of 3 positions shown; findings below may reference images not displayed]

FINDINGS: Low normal osseous mineralization.

AC joint alignment normal.

No acute fracture, dislocation, or bone destruction.

Visualized RIGHT ribs intact.
IMPRESSION: No acute abnormalities.

## 2018-01-16 ENCOUNTER — Other Ambulatory Visit: Payer: Self-pay | Admitting: Family Medicine

## 2018-02-15 ENCOUNTER — Encounter: Payer: Medicare Other | Admitting: Family Medicine

## 2018-02-19 ENCOUNTER — Other Ambulatory Visit: Payer: Self-pay | Admitting: Family Medicine

## 2018-02-19 ENCOUNTER — Telehealth: Payer: Self-pay | Admitting: Family Medicine

## 2018-02-19 DIAGNOSIS — E039 Hypothyroidism, unspecified: Secondary | ICD-10-CM

## 2018-02-19 DIAGNOSIS — I10 Essential (primary) hypertension: Secondary | ICD-10-CM

## 2018-02-19 DIAGNOSIS — E785 Hyperlipidemia, unspecified: Secondary | ICD-10-CM

## 2018-02-19 DIAGNOSIS — Z0000 Encounter for general adult medical examination without abnormal findings: Secondary | ICD-10-CM

## 2018-02-19 DIAGNOSIS — E119 Type 2 diabetes mellitus without complications: Secondary | ICD-10-CM

## 2018-02-19 NOTE — Telephone Encounter (Signed)
Labs placed in Epic, pt aware

## 2018-02-19 NOTE — Telephone Encounter (Signed)
Lipid, liver, metabolic 7, hemoglobin D5O, PSA, TSH, urine ACR, CBC-hypothyroidism hypertension hyperlipidemia diabetes, screening

## 2018-02-19 NOTE — Telephone Encounter (Signed)
Patient has an appointment with Dr. Richardson Landry on 02/26/18.  He is requesting orders for labs.

## 2018-02-19 NOTE — Telephone Encounter (Signed)
Last labs TSH, BMET, Lipid, Liver

## 2018-02-20 DIAGNOSIS — E119 Type 2 diabetes mellitus without complications: Secondary | ICD-10-CM | POA: Diagnosis not present

## 2018-02-20 DIAGNOSIS — I10 Essential (primary) hypertension: Secondary | ICD-10-CM | POA: Diagnosis not present

## 2018-02-20 DIAGNOSIS — Z0000 Encounter for general adult medical examination without abnormal findings: Secondary | ICD-10-CM | POA: Diagnosis not present

## 2018-02-20 DIAGNOSIS — E039 Hypothyroidism, unspecified: Secondary | ICD-10-CM | POA: Diagnosis not present

## 2018-02-20 DIAGNOSIS — E785 Hyperlipidemia, unspecified: Secondary | ICD-10-CM | POA: Diagnosis not present

## 2018-02-21 LAB — CBC WITH DIFFERENTIAL/PLATELET
BASOS ABS: 0 10*3/uL (ref 0.0–0.2)
Basos: 1 %
EOS (ABSOLUTE): 0.2 10*3/uL (ref 0.0–0.4)
Eos: 3 %
Hematocrit: 45.6 % (ref 37.5–51.0)
Hemoglobin: 15.8 g/dL (ref 13.0–17.7)
IMMATURE GRANULOCYTES: 0 %
Immature Grans (Abs): 0 10*3/uL (ref 0.0–0.1)
LYMPHS ABS: 1.7 10*3/uL (ref 0.7–3.1)
Lymphs: 30 %
MCH: 32.7 pg (ref 26.6–33.0)
MCHC: 34.6 g/dL (ref 31.5–35.7)
MCV: 94 fL (ref 79–97)
MONOCYTES: 12 %
MONOS ABS: 0.7 10*3/uL (ref 0.1–0.9)
NEUTROS PCT: 54 %
Neutrophils Absolute: 3 10*3/uL (ref 1.4–7.0)
Platelets: 212 10*3/uL (ref 150–379)
RBC: 4.83 x10E6/uL (ref 4.14–5.80)
RDW: 12.9 % (ref 12.3–15.4)
WBC: 5.6 10*3/uL (ref 3.4–10.8)

## 2018-02-21 LAB — HEPATIC FUNCTION PANEL
ALBUMIN: 4.4 g/dL (ref 3.5–4.8)
ALK PHOS: 73 IU/L (ref 39–117)
ALT: 23 IU/L (ref 0–44)
AST: 19 IU/L (ref 0–40)
BILIRUBIN TOTAL: 0.5 mg/dL (ref 0.0–1.2)
BILIRUBIN, DIRECT: 0.14 mg/dL (ref 0.00–0.40)
Total Protein: 6.5 g/dL (ref 6.0–8.5)

## 2018-02-21 LAB — BASIC METABOLIC PANEL
BUN/Creatinine Ratio: 17 (ref 10–24)
BUN: 17 mg/dL (ref 8–27)
CALCIUM: 9.5 mg/dL (ref 8.6–10.2)
CO2: 26 mmol/L (ref 20–29)
CREATININE: 0.98 mg/dL (ref 0.76–1.27)
Chloride: 104 mmol/L (ref 96–106)
GFR calc Af Amer: 86 mL/min/{1.73_m2} (ref >59)
GFR calc non Af Amer: 75 mL/min/{1.73_m2} (ref >59)
Glucose: 125 mg/dL — ABNORMAL HIGH (ref 65–99)
Potassium: 5.1 mmol/L (ref 3.5–5.2)
Sodium: 142 mmol/L (ref 134–144)

## 2018-02-21 LAB — HEMOGLOBIN A1C
Est. average glucose Bld gHb Est-mCnc: 148 mg/dL
Hgb A1c MFr Bld: 6.8 % — ABNORMAL HIGH (ref 4.8–5.6)

## 2018-02-21 LAB — LIPID PANEL
CHOL/HDL RATIO: 4.3 ratio (ref 0.0–5.0)
Cholesterol, Total: 169 mg/dL (ref 100–199)
HDL: 39 mg/dL — AB (ref >39)
LDL Calculated: 106 mg/dL — ABNORMAL HIGH (ref 0–99)
Triglycerides: 121 mg/dL (ref 0–149)
VLDL Cholesterol Cal: 24 mg/dL (ref 5–40)

## 2018-02-21 LAB — MICROALBUMIN / CREATININE URINE RATIO
CREATININE, UR: 174.7 mg/dL
MICROALB/CREAT RATIO: 12 mg/g{creat} (ref 0.0–30.0)
MICROALBUM., U, RANDOM: 21 ug/mL

## 2018-02-21 LAB — PSA: Prostate Specific Ag, Serum: 0.5 ng/mL (ref 0.0–4.0)

## 2018-02-21 LAB — TSH: TSH: 2.98 u[IU]/mL (ref 0.450–4.500)

## 2018-02-26 ENCOUNTER — Ambulatory Visit (INDEPENDENT_AMBULATORY_CARE_PROVIDER_SITE_OTHER): Payer: Medicare Other | Admitting: Family Medicine

## 2018-02-26 ENCOUNTER — Encounter: Payer: Self-pay | Admitting: Family Medicine

## 2018-02-26 VITALS — BP 138/74 | Ht 67.0 in | Wt 203.0 lb

## 2018-02-26 DIAGNOSIS — R7303 Prediabetes: Secondary | ICD-10-CM

## 2018-02-26 DIAGNOSIS — E785 Hyperlipidemia, unspecified: Secondary | ICD-10-CM | POA: Diagnosis not present

## 2018-02-26 DIAGNOSIS — E119 Type 2 diabetes mellitus without complications: Secondary | ICD-10-CM

## 2018-02-26 DIAGNOSIS — I10 Essential (primary) hypertension: Secondary | ICD-10-CM

## 2018-02-26 DIAGNOSIS — E039 Hypothyroidism, unspecified: Secondary | ICD-10-CM | POA: Diagnosis not present

## 2018-02-26 DIAGNOSIS — Z0001 Encounter for general adult medical examination with abnormal findings: Secondary | ICD-10-CM | POA: Diagnosis not present

## 2018-02-26 DIAGNOSIS — Z0000 Encounter for general adult medical examination without abnormal findings: Secondary | ICD-10-CM

## 2018-02-26 MED ORDER — LISINOPRIL 40 MG PO TABS: 40.0000 mg | ORAL_TABLET | Freq: Every day | ORAL | 1 refills | Status: DC

## 2018-02-26 MED ORDER — LEVOTHYROXINE SODIUM 88 MCG PO TABS: ORAL_TABLET | ORAL | 1 refills | Status: DC

## 2018-02-26 MED ORDER — ATORVASTATIN CALCIUM 40 MG PO TABS: 40.0000 mg | ORAL_TABLET | Freq: Every day | ORAL | 1 refills | Status: DC

## 2018-02-26 NOTE — Patient Instructions (Addendum)
  Results for orders placed or performed in visit on 02/19/18  CBC with Differential  Result Value Ref Range   WBC 5.6 3.4 - 10.8 x10E3/uL   RBC 4.83 4.14 - 5.80 x10E6/uL   Hemoglobin 15.8 13.0 - 17.7 g/dL   Hematocrit 45.6 37.5 - 51.0 %   MCV 94 79 - 97 fL   MCH 32.7 26.6 - 33.0 pg   MCHC 34.6 31.5 - 35.7 g/dL   RDW 12.9 12.3 - 15.4 %   Platelets 212 150 - 379 x10E3/uL   Neutrophils 54 Not Estab. %   Lymphs 30 Not Estab. %   Monocytes 12 Not Estab. %   Eos 3 Not Estab. %   Basos 1 Not Estab. %   Neutrophils Absolute 3.0 1.4 - 7.0 x10E3/uL   Lymphocytes Absolute 1.7 0.7 - 3.1 x10E3/uL   Monocytes Absolute 0.7 0.1 - 0.9 x10E3/uL   EOS (ABSOLUTE) 0.2 0.0 - 0.4 x10E3/uL   Basophils Absolute 0.0 0.0 - 0.2 x10E3/uL   Immature Granulocytes 0 Not Estab. %   Immature Grans (Abs) 0.0 0.0 - 0.1 x10E3/uL  Urine Microalbumin w/creat. ratio  Result Value Ref Range   Creatinine, Urine 174.7 Not Estab. mg/dL   Microalbumin, Urine 21.0 Not Estab. ug/mL   Microalb/Creat Ratio 12.0 0.0 - 30.0 mg/g creat  TSH  Result Value Ref Range   TSH 2.980 0.450 - 4.500 uIU/mL  PSA  Result Value Ref Range   Prostate Specific Ag, Serum 0.5 0.0 - 4.0 ng/mL  HgB A1c  Result Value Ref Range   Hgb A1c MFr Bld 6.8 (H) 4.8 - 5.6 %   Est. average glucose Bld gHb Est-mCnc 148 mg/dL  Basic Metabolic Panel (BMET)  Result Value Ref Range   Glucose 125 (H) 65 - 99 mg/dL   BUN 17 8 - 27 mg/dL   Creatinine, Ser 0.98 0.76 - 1.27 mg/dL   GFR calc non Af Amer 75 >59 mL/min/1.73   GFR calc Af Amer 86 >59 mL/min/1.73   BUN/Creatinine Ratio 17 10 - 24   Sodium 142 134 - 144 mmol/L   Potassium 5.1 3.5 - 5.2 mmol/L   Chloride 104 96 - 106 mmol/L   CO2 26 20 - 29 mmol/L   Calcium 9.5 8.6 - 10.2 mg/dL  Hepatic function panel  Result Value Ref Range   Total Protein 6.5 6.0 - 8.5 g/dL   Albumin 4.4 3.5 - 4.8 g/dL   Bilirubin Total 0.5 0.0 - 1.2 mg/dL   Bilirubin, Direct 0.14 0.00 - 0.40 mg/dL   Alkaline Phosphatase  73 39 - 117 IU/L   AST 19 0 - 40 IU/L   ALT 23 0 - 44 IU/L  Lipid Profile  Result Value Ref Range   Cholesterol, Total 169 100 - 199 mg/dL   Triglycerides 121 0 - 149 mg/dL   HDL 39 (L) >39 mg/dL   VLDL Cholesterol Cal 24 5 - 40 mg/dL   LDL Calculated 106 (H) 0 - 99 mg/dL   Chol/HDL Ratio 4.3 0.0 - 5.0 ratio

## 2018-02-26 NOTE — Progress Notes (Signed)
Subjective:    Patient ID: Cameron Smith, male    DOB: 02-22-1941, 77 y.o.   MRN: [id number]  HPI AWV- Annual Wellness Visit  The patient was seen for their annual wellness visit. The patient's past medical history, surgical history, and family history were reviewed. Pertinent vaccines were reviewed ( tetanus, pneumonia, shingles, flu) The patient's medication list was reviewed and updated.  The height and weight were entered.  BMI recorded in electronic record elsewhere  Cognitive screening was completed. Outcome of Mini - Cog: pass   Falls /depression screening electronically recorded within record elsewhere  Current tobacco usage: none (All patients who use tobacco were given written and verbal information on quitting)  Recent listing of emergency department/hospitalizations over the past year were reviewed.  current specialist the patient sees on a regular basis: none   Medicare annual wellness visit patient questionnaire was reviewed.  A written screening schedule for the patient for the next 5-10 years was given. Appropriate discussion of followup regarding next visit was discussed.  Blood pressure medicine and blood pressure levels reviewed today with patient. Compliant with blood pressure medicine. States does not miss a dose. No obvious side effects. Blood pressure generally good when checked elsewhere. Watching salt intake.   Patient continues to take lipid medication regularly. No obvious side effects from it. Generally does not miss a dose. Prior blood work results are reviewed with patient. Patient continues to work on fat intake in diet   Results for orders placed or performed in visit on 02/19/18  CBC with Differential  Result Value Ref Range   WBC 5.6 3.4 - 10.8 x10E3/uL   RBC 4.83 4.14 - 5.80 x10E6/uL   Hemoglobin 15.8 13.0 - 17.7 g/dL   Hematocrit 45.6 37.5 - 51.0 %   MCV 94 79 - 97 fL   MCH 32.7 26.6 - 33.0 pg   MCHC 34.6 31.5 - 35.7 g/dL   RDW 12.9  12.3 - 15.4 %   Platelets 212 150 - 379 x10E3/uL   Neutrophils 54 Not Estab. %   Lymphs 30 Not Estab. %   Monocytes 12 Not Estab. %   Eos 3 Not Estab. %   Basos 1 Not Estab. %   Neutrophils Absolute 3.0 1.4 - 7.0 x10E3/uL   Lymphocytes Absolute 1.7 0.7 - 3.1 x10E3/uL   Monocytes Absolute 0.7 0.1 - 0.9 x10E3/uL   EOS (ABSOLUTE) 0.2 0.0 - 0.4 x10E3/uL   Basophils Absolute 0.0 0.0 - 0.2 x10E3/uL   Immature Granulocytes 0 Not Estab. %   Immature Grans (Abs) 0.0 0.0 - 0.1 x10E3/uL  Urine Microalbumin w/creat. ratio  Result Value Ref Range   Creatinine, Urine 174.7 Not Estab. mg/dL   Microalbumin, Urine 21.0 Not Estab. ug/mL   Microalb/Creat Ratio 12.0 0.0 - 30.0 mg/g creat  TSH  Result Value Ref Range   TSH 2.980 0.450 - 4.500 uIU/mL  PSA  Result Value Ref Range   Prostate Specific Ag, Serum 0.5 0.0 - 4.0 ng/mL  HgB A1c  Result Value Ref Range   Hgb A1c MFr Bld 6.8 (H) 4.8 - 5.6 %   Est. average glucose Bld gHb Est-mCnc 148 mg/dL  Basic Metabolic Panel (BMET)  Result Value Ref Range   Glucose 125 (H) 65 - 99 mg/dL   BUN 17 8 - 27 mg/dL   Creatinine, Ser 0.98 0.76 - 1.27 mg/dL   GFR calc non Af Amer 75 >59 mL/min/1.73   GFR calc Af Amer 86 >59 mL/min/1.73  BUN/Creatinine Ratio 17 10 - 24   Sodium 142 134 - 144 mmol/L   Potassium 5.1 3.5 - 5.2 mmol/L   Chloride 104 96 - 106 mmol/L   CO2 26 20 - 29 mmol/L   Calcium 9.5 8.6 - 10.2 mg/dL  Hepatic function panel  Result Value Ref Range   Total Protein 6.5 6.0 - 8.5 g/dL   Albumin 4.4 3.5 - 4.8 g/dL   Bilirubin Total 0.5 0.0 - 1.2 mg/dL   Bilirubin, Direct 0.14 0.00 - 0.40 mg/dL   Alkaline Phosphatase 73 39 - 117 IU/L   AST 19 0 - 40 IU/L   ALT 23 0 - 44 IU/L  Lipid Profile  Result Value Ref Range   Cholesterol, Total 169 100 - 199 mg/dL   Triglycerides 121 0 - 149 mg/dL   HDL 39 (L) >39 mg/dL   VLDL Cholesterol Cal 24 5 - 40 mg/dL   LDL Calculated 106 (H) 0 - 99 mg/dL   Chol/HDL Ratio 4.3 0.0 - 5.0 ratio        Review of Systems No headache, no major weight loss or weight gain, no chest pain no back pain abdominal pain no change in bowel habits complete ROS otherwise negative     Objective:   Physical Exam  Constitutional: He appears well-developed and well-nourished.  HENT:  Head: Normocephalic and atraumatic.  Right Ear: External ear normal.  Left Ear: External ear normal.  Nose: Nose normal.  Mouth/Throat: Oropharynx is clear and moist.  Eyes: Right eye exhibits no discharge. Left eye exhibits no discharge. No scleral icterus.  Neck: Normal range of motion. Neck supple. No thyromegaly present.  Cardiovascular: Normal rate, regular rhythm and normal heart sounds.  No murmur heard. Pulmonary/Chest: Effort normal and breath sounds normal. No respiratory distress. He has no wheezes.  Abdominal: Soft. Bowel sounds are normal. He exhibits no distension and no mass. There is no tenderness.  Genitourinary: Penis normal.  Musculoskeletal: Normal range of motion. He exhibits no edema.  Lymphadenopathy:    He has no cervical adenopathy.  Neurological: He is alert. He exhibits normal muscle tone. Coordination normal.  Skin: Skin is warm and dry. No erythema.  Psychiatric: He has a normal mood and affect. His behavior is normal. Judgment normal.  Vitals reviewed.         Assessment & Plan:  Colon due four more yrs Impression #1 wellness exam.  Diet discussed.  Exercise discussed.  Next colonoscopy 4 years  2.  Type 2 diabetes. Onset.  Discussed at great length.  Many questions answered.  Educational session recommend hold off on medication at this time.  Discussed.  Yearly doctor visits  3.  Hypertension good control discussed maintain same meds  4.  Hyperlipidemia blood work reviewed compliance reviewed control discussed maintain same  5.  Hypothyroidism

## 2018-06-10 ENCOUNTER — Other Ambulatory Visit: Payer: Self-pay | Admitting: Family Medicine

## 2018-08-19 ENCOUNTER — Telehealth: Payer: Self-pay | Admitting: Family Medicine

## 2018-08-19 DIAGNOSIS — E119 Type 2 diabetes mellitus without complications: Secondary | ICD-10-CM

## 2018-08-19 DIAGNOSIS — E785 Hyperlipidemia, unspecified: Secondary | ICD-10-CM

## 2018-08-19 DIAGNOSIS — I10 Essential (primary) hypertension: Secondary | ICD-10-CM

## 2018-08-19 NOTE — Telephone Encounter (Signed)
Patient has appointment on Friday for 6 month follow up and would like labs work done in the morning if possible

## 2018-08-19 NOTE — Telephone Encounter (Signed)
Lip liv m7 

## 2018-08-19 NOTE — Telephone Encounter (Signed)
Last labs 02/20/18 lipid, liver, bmp, a1c, psa, tsh, urine microalb.

## 2018-08-19 NOTE — Telephone Encounter (Signed)
Blood work ordered in Epic. Patient notified. 

## 2018-08-20 DIAGNOSIS — I10 Essential (primary) hypertension: Secondary | ICD-10-CM | POA: Diagnosis not present

## 2018-08-20 DIAGNOSIS — E119 Type 2 diabetes mellitus without complications: Secondary | ICD-10-CM | POA: Diagnosis not present

## 2018-08-20 DIAGNOSIS — E785 Hyperlipidemia, unspecified: Secondary | ICD-10-CM | POA: Diagnosis not present

## 2018-08-21 LAB — BASIC METABOLIC PANEL
BUN/Creatinine Ratio: 17 (ref 10–24)
BUN: 15 mg/dL (ref 8–27)
CALCIUM: 9.7 mg/dL (ref 8.6–10.2)
CHLORIDE: 102 mmol/L (ref 96–106)
CO2: 25 mmol/L (ref 20–29)
Creatinine, Ser: 0.86 mg/dL (ref 0.76–1.27)
GFR calc non Af Amer: 84 mL/min/{1.73_m2} (ref >59)
GFR, EST AFRICAN AMERICAN: 97 mL/min/{1.73_m2} (ref >59)
Glucose: 98 mg/dL (ref 65–99)
POTASSIUM: 5.4 mmol/L — AB (ref 3.5–5.2)
Sodium: 142 mmol/L (ref 134–144)

## 2018-08-21 LAB — LIPID PANEL
CHOLESTEROL TOTAL: 152 mg/dL (ref 100–199)
Chol/HDL Ratio: 3.8 ratio (ref 0.0–5.0)
HDL: 40 mg/dL (ref >39)
LDL CALC: 92 mg/dL (ref 0–99)
TRIGLYCERIDES: 100 mg/dL (ref 0–149)
VLDL Cholesterol Cal: 20 mg/dL (ref 5–40)

## 2018-08-21 LAB — HEPATIC FUNCTION PANEL
ALT: 25 IU/L (ref 0–44)
AST: 19 IU/L (ref 0–40)
Albumin: 4.4 g/dL (ref 3.5–4.8)
Alkaline Phosphatase: 59 IU/L (ref 39–117)
BILIRUBIN TOTAL: 0.8 mg/dL (ref 0.0–1.2)
Bilirubin, Direct: 0.21 mg/dL (ref 0.00–0.40)
Total Protein: 6.4 g/dL (ref 6.0–8.5)

## 2018-08-21 LAB — HEMOGLOBIN A1C
ESTIMATED AVERAGE GLUCOSE: 134 mg/dL
HEMOGLOBIN A1C: 6.3 % — AB (ref 4.8–5.6)

## 2018-08-23 ENCOUNTER — Encounter: Payer: Self-pay | Admitting: Family Medicine

## 2018-08-23 ENCOUNTER — Ambulatory Visit (INDEPENDENT_AMBULATORY_CARE_PROVIDER_SITE_OTHER): Payer: Medicare Other | Admitting: Family Medicine

## 2018-08-23 VITALS — BP 150/82 | Ht 67.0 in | Wt 202.0 lb

## 2018-08-23 DIAGNOSIS — Z23 Encounter for immunization: Secondary | ICD-10-CM

## 2018-08-23 DIAGNOSIS — E785 Hyperlipidemia, unspecified: Secondary | ICD-10-CM | POA: Diagnosis not present

## 2018-08-23 DIAGNOSIS — E039 Hypothyroidism, unspecified: Secondary | ICD-10-CM

## 2018-08-23 DIAGNOSIS — I10 Essential (primary) hypertension: Secondary | ICD-10-CM

## 2018-08-23 DIAGNOSIS — E119 Type 2 diabetes mellitus without complications: Secondary | ICD-10-CM

## 2018-08-23 MED ORDER — ATORVASTATIN CALCIUM 40 MG PO TABS: 40.0000 mg | ORAL_TABLET | Freq: Every day | ORAL | 1 refills | Status: DC

## 2018-08-23 MED ORDER — LEVOTHYROXINE SODIUM 88 MCG PO TABS: ORAL_TABLET | ORAL | 1 refills | Status: DC

## 2018-08-23 MED ORDER — MOMETASONE FUROATE 0.1 % EX CREA: TOPICAL_CREAM | CUTANEOUS | 0 refills | Status: DC

## 2018-08-23 MED ORDER — LISINOPRIL 40 MG PO TABS: 40.0000 mg | ORAL_TABLET | Freq: Every day | ORAL | 1 refills | Status: DC

## 2018-08-23 NOTE — Patient Instructions (Signed)
Results for orders placed or performed in visit on 08/19/18  Lipid panel  Result Value Ref Range   Cholesterol, Total 152 100 - 199 mg/dL   Triglycerides 100 0 - 149 mg/dL   HDL 40 >39 mg/dL   VLDL Cholesterol Cal 20 5 - 40 mg/dL   LDL Calculated 92 0 - 99 mg/dL   Chol/HDL Ratio 3.8 0.0 - 5.0 ratio  Hepatic function panel  Result Value Ref Range   Total Protein 6.4 6.0 - 8.5 g/dL   Albumin 4.4 3.5 - 4.8 g/dL   Bilirubin Total 0.8 0.0 - 1.2 mg/dL   Bilirubin, Direct 0.21 0.00 - 0.40 mg/dL   Alkaline Phosphatase 59 39 - 117 IU/L   AST 19 0 - 40 IU/L   ALT 25 0 - 44 IU/L  Basic metabolic panel  Result Value Ref Range   Glucose 98 65 - 99 mg/dL   BUN 15 8 - 27 mg/dL   Creatinine, Ser 0.86 0.76 - 1.27 mg/dL   GFR calc non Af Amer 84 >59 mL/min/1.73   GFR calc Af Amer 97 >59 mL/min/1.73   BUN/Creatinine Ratio 17 10 - 24   Sodium 142 134 - 144 mmol/L   Potassium 5.4 (H) 3.5 - 5.2 mmol/L   Chloride 102 96 - 106 mmol/L   CO2 25 20 - 29 mmol/L   Calcium 9.7 8.6 - 10.2 mg/dL  Hemoglobin A1c  Result Value Ref Range   Hgb A1c MFr Bld 6.3 (H) 4.8 - 5.6 %   Est. average glucose Bld gHb Est-mCnc 134 mg/dL

## 2018-08-23 NOTE — Progress Notes (Signed)
Subjective:    Patient ID: Cameron Smith, male    DOB: 12/19/1940, 77 y.o.   MRN: [id number]  Hyperlipidemia  This is a chronic problem. The current episode started more than 1 year ago. Treatments tried: atorvastatin. Compliance problems include adherence to exercise.    Would like flu vaccine today.   Patient continues to take lipid medication regularly. No obvious side effects from it. Generally does not miss a dose. Prior blood work results are reviewed with patient. Patient continues to work on fat intake in diet  Blood pressure medicine and blood pressure levels reviewed today with patient. Compliant with blood pressure medicine. States does not miss a dose. No obvious side effects. Blood pressure generally good when checked elsewhere. Watching salt intake.  Results for orders placed or performed in visit on 08/19/18  Lipid panel  Result Value Ref Range   Cholesterol, Total 152 100 - 199 mg/dL   Triglycerides 100 0 - 149 mg/dL   HDL 40 >39 mg/dL   VLDL Cholesterol Cal 20 5 - 40 mg/dL   LDL Calculated 92 0 - 99 mg/dL   Chol/HDL Ratio 3.8 0.0 - 5.0 ratio  Hepatic function panel  Result Value Ref Range   Total Protein 6.4 6.0 - 8.5 g/dL   Albumin 4.4 3.5 - 4.8 g/dL   Bilirubin Total 0.8 0.0 - 1.2 mg/dL   Bilirubin, Direct 0.21 0.00 - 0.40 mg/dL   Alkaline Phosphatase 59 39 - 117 IU/L   AST 19 0 - 40 IU/L   ALT 25 0 - 44 IU/L  Basic metabolic panel  Result Value Ref Range   Glucose 98 65 - 99 mg/dL   BUN 15 8 - 27 mg/dL   Creatinine, Ser 0.86 0.76 - 1.27 mg/dL   GFR calc non Af Amer 84 >59 mL/min/1.73   GFR calc Af Amer 97 >59 mL/min/1.73   BUN/Creatinine Ratio 17 10 - 24   Sodium 142 134 - 144 mmol/L   Potassium 5.4 (H) 3.5 - 5.2 mmol/L   Chloride 102 96 - 106 mmol/L   CO2 25 20 - 29 mmol/L   Calcium 9.7 8.6 - 10.2 mg/dL  Hemoglobin A1c  Result Value Ref Range   Hgb A1c MFr Bld 6.3 (H) 4.8 - 5.6 %   Est. average glucose Bld gHb Est-mCnc 134 mg/dL    Patient  claims compliance with diabetes medication. No obvious side effects. Reports no substantial low sugar spells. Most numbers are generally in good range when checked fasting. Generally does not miss a dose of medication. Watching diabetic diet closely    Review of Systems No headache, no major weight loss or weight gain, no chest pain no back pain abdominal pain no change in bowel habits complete ROS otherwise negative     Objective:   Physical Exam  Alert and oriented, vitals reviewed and stable, NAD ENT-TM's and ext canals WNL bilat via otoscopic exam Soft palate, tonsils and post pharynx WNL via oropharyngeal exam Neck-symmetric, no masses; thyroid nonpalpable and nontender Pulmonary-no tachypnea or accessory muscle use; Clear without wheezes via auscultation Card--no abnrml murmurs, rhythm reg and rate WNL Carotid pulses symmetric, without bruits       Assessment & Plan:  Impression type 2 diabetes.  Control good discussed exam.  Diet discussed  2.  Hypertension.  Blood pressure good on repeat.  Patient maintain same follow-up as needed  3.  Hyperlipidemia.  Prior blood work reviewed patient maintain same meds  Diet discussed exercise  discussed medications refilled flu shot given/follow-up in 6 months for wellness plus chronic

## 2018-11-19 ENCOUNTER — Encounter: Payer: Self-pay | Admitting: Family Medicine

## 2018-11-19 ENCOUNTER — Ambulatory Visit (INDEPENDENT_AMBULATORY_CARE_PROVIDER_SITE_OTHER): Payer: Medicare Other | Admitting: Family Medicine

## 2018-11-19 ENCOUNTER — Ambulatory Visit: Payer: Medicare Other | Admitting: Family Medicine

## 2018-11-19 VITALS — BP 130/82 | Ht 67.0 in | Wt 203.0 lb

## 2018-11-19 DIAGNOSIS — R42 Dizziness and giddiness: Secondary | ICD-10-CM | POA: Diagnosis not present

## 2018-11-19 DIAGNOSIS — J310 Chronic rhinitis: Secondary | ICD-10-CM

## 2018-11-19 DIAGNOSIS — J329 Chronic sinusitis, unspecified: Secondary | ICD-10-CM

## 2018-11-19 MED ORDER — MECLIZINE HCL 25 MG PO TABS: 25.0000 mg | ORAL_TABLET | Freq: Three times a day (TID) | ORAL | 4 refills | Status: DC | PRN

## 2018-11-19 MED ORDER — AMOXICILLIN 500 MG PO CAPS: 500.0000 mg | ORAL_CAPSULE | Freq: Three times a day (TID) | ORAL | 0 refills | Status: DC

## 2018-11-19 NOTE — Progress Notes (Signed)
   Subjective:    Patient ID: Cameron Smith, male    DOB: June 09, 1941, 77 y.o.   MRN: [id number]  HPI  Patient  Arrives to discuss dizziness and inner ear issues. Patient states he normally uses dramamine but was looking to get something that didn't cause drowsiness esp when fishing.  Pt notes dizzziness  Major challenge    Vertigo in nature   When first comes on attatcjk will hit for seveal days   Gets unsteady with movement ane motion    When it strikes  It usually occurs with chang e of position    Has had off and on for many years.  Still the same.   Also frontal headache.  Positive gunky nasal discharge.  2 weeks in duration.   Review of Systems No headache, no major weight loss or weight gain, no chest pain no back pain abdominal pain no change in bowel habits complete ROS otherwise negative     Objective:   Physical Exam  Alert, mild malaise. Hydration good Vitals stable. frontal/ maxillary tenderness evident positive nasal congestion. pharynx normal neck supple  lungs clear/no crackles or wheezes. heart regular in rhythm Neuro exam intact      Assessment & Plan:  Impression rhinosinusitis likely post viral, discussed with patient. plan antibiotics prescribed. Questions answered. Symptomatic care discussed. warning signs discussed.  #2  Intermittent vertigo.  History of this for years.  Over-the-counter medication causing too much side effects with Dramamine.  Options discussed.  Will prescribe Antivert proper use discussed WSL

## 2019-02-01 ENCOUNTER — Other Ambulatory Visit: Payer: Self-pay | Admitting: Family Medicine

## 2019-02-04 ENCOUNTER — Other Ambulatory Visit: Payer: Self-pay | Admitting: Family Medicine

## 2019-02-04 ENCOUNTER — Telehealth: Payer: Self-pay | Admitting: Family Medicine

## 2019-02-04 DIAGNOSIS — E785 Hyperlipidemia, unspecified: Secondary | ICD-10-CM

## 2019-02-04 DIAGNOSIS — I10 Essential (primary) hypertension: Secondary | ICD-10-CM

## 2019-02-04 DIAGNOSIS — Z79899 Other long term (current) drug therapy: Secondary | ICD-10-CM

## 2019-02-04 DIAGNOSIS — E119 Type 2 diabetes mellitus without complications: Secondary | ICD-10-CM

## 2019-02-04 NOTE — Telephone Encounter (Signed)
Lab orders placed and pt is aware 

## 2019-02-04 NOTE — Telephone Encounter (Signed)
Rep same 

## 2019-02-04 NOTE — Telephone Encounter (Signed)
Last labs 08/20/18 , lipid, liver, bmp, a1c

## 2019-02-04 NOTE — Telephone Encounter (Signed)
Patient has appointment 4/10 and needing labs done .

## 2019-02-10 ENCOUNTER — Telehealth: Payer: Self-pay | Admitting: Family Medicine

## 2019-02-10 NOTE — Telephone Encounter (Signed)
error 

## 2019-02-28 ENCOUNTER — Encounter: Payer: Medicare Other | Admitting: Family Medicine

## 2019-03-13 DIAGNOSIS — Z79899 Other long term (current) drug therapy: Secondary | ICD-10-CM | POA: Diagnosis not present

## 2019-03-13 DIAGNOSIS — E119 Type 2 diabetes mellitus without complications: Secondary | ICD-10-CM | POA: Diagnosis not present

## 2019-03-13 DIAGNOSIS — E039 Hypothyroidism, unspecified: Secondary | ICD-10-CM | POA: Diagnosis not present

## 2019-03-13 DIAGNOSIS — E785 Hyperlipidemia, unspecified: Secondary | ICD-10-CM | POA: Diagnosis not present

## 2019-03-13 DIAGNOSIS — I10 Essential (primary) hypertension: Secondary | ICD-10-CM | POA: Diagnosis not present

## 2019-03-14 LAB — BASIC METABOLIC PANEL
BUN/Creatinine Ratio: 18 (ref 10–24)
BUN: 16 mg/dL (ref 8–27)
CALCIUM: 10.1 mg/dL (ref 8.6–10.2)
CO2: 28 mmol/L (ref 20–29)
Chloride: 102 mmol/L (ref 96–106)
Creatinine, Ser: 0.91 mg/dL (ref 0.76–1.27)
GFR calc Af Amer: 94 mL/min/{1.73_m2} (ref >59)
GFR calc non Af Amer: 81 mL/min/{1.73_m2} (ref >59)
Glucose: 110 mg/dL — ABNORMAL HIGH (ref 65–99)
Potassium: 5.1 mmol/L (ref 3.5–5.2)
Sodium: 142 mmol/L (ref 134–144)

## 2019-03-14 LAB — HEPATIC FUNCTION PANEL
ALBUMIN: 4.7 g/dL (ref 3.7–4.7)
ALT: 20 IU/L (ref 0–44)
AST: 17 IU/L (ref 0–40)
Alkaline Phosphatase: 67 IU/L (ref 39–117)
Bilirubin Total: 0.7 mg/dL (ref 0.0–1.2)
Bilirubin, Direct: 0.16 mg/dL (ref 0.00–0.40)
Total Protein: 6.6 g/dL (ref 6.0–8.5)

## 2019-03-14 LAB — LIPID PANEL
CHOL/HDL RATIO: 4.3 ratio (ref 0.0–5.0)
Cholesterol, Total: 166 mg/dL (ref 100–199)
HDL: 39 mg/dL — ABNORMAL LOW (ref >39)
LDL Calculated: 102 mg/dL — ABNORMAL HIGH (ref 0–99)
Triglycerides: 125 mg/dL (ref 0–149)
VLDL Cholesterol Cal: 25 mg/dL (ref 5–40)

## 2019-03-14 LAB — HEMOGLOBIN A1C
Est. average glucose Bld gHb Est-mCnc: 137 mg/dL
HEMOGLOBIN A1C: 6.4 % — AB (ref 4.8–5.6)

## 2019-03-18 ENCOUNTER — Telehealth: Payer: Self-pay | Admitting: *Deleted

## 2019-03-18 ENCOUNTER — Other Ambulatory Visit: Payer: Self-pay

## 2019-03-18 ENCOUNTER — Ambulatory Visit (INDEPENDENT_AMBULATORY_CARE_PROVIDER_SITE_OTHER): Payer: Medicare Other | Admitting: Family Medicine

## 2019-03-18 ENCOUNTER — Encounter: Payer: Self-pay | Admitting: Family Medicine

## 2019-03-18 DIAGNOSIS — E785 Hyperlipidemia, unspecified: Secondary | ICD-10-CM

## 2019-03-18 DIAGNOSIS — E039 Hypothyroidism, unspecified: Secondary | ICD-10-CM | POA: Diagnosis not present

## 2019-03-18 DIAGNOSIS — E119 Type 2 diabetes mellitus without complications: Secondary | ICD-10-CM | POA: Diagnosis not present

## 2019-03-18 DIAGNOSIS — I10 Essential (primary) hypertension: Secondary | ICD-10-CM | POA: Diagnosis not present

## 2019-03-18 MED ORDER — AMLODIPINE BESYLATE 2.5 MG PO TABS: 2.5000 mg | ORAL_TABLET | ORAL | 1 refills | Status: DC

## 2019-03-18 NOTE — Progress Notes (Signed)
   Subjective:    Patient ID: Cameron Smith, male    DOB: 20-Feb-1941, 78 y.o.   MRN: [id number]  HPI     Review of Systems     Objective:   Physical Exam        Assessment & Plan:

## 2019-03-18 NOTE — Progress Notes (Signed)
   Subjective:    Patient ID: Cameron Smith, male    DOB: 1941/05/06, 78 y.o.   MRN: [id number]  Hyperlipidemia  This is a chronic problem. The current episode started more than 1 year ago.      Review of Systems     Objective:   Physical Exam        Assessment & Plan:

## 2019-03-18 NOTE — Telephone Encounter (Signed)
Called and spoke with representative at [facility] to have tsh added per dr Richardson Landry. Diagnosis code given E03.9. will need to sign fax from [place] when it comes in.

## 2019-03-18 NOTE — Progress Notes (Signed)
   Subjective:    Patient ID: Cameron Smith, male    DOB: January 29, 1941, 78 y.o.   MRN: [id number]  Format - phone  Patient present at home Provider present at office Consent for interaction obtained Coronavirus outbreak made virtual visit necessary    Pt took bp this morning at home. 146/68 and pulse was 62 Hyperlipidemia  This is a chronic problem. The current episode started more than 1 year ago. Treatments tried: atorvastatin 40mg . Compliance problems include adherence to exercise (takes meds every day, eats healthy, not much exercise).    Pt states no concerns today.   Virtual Visit via Telephone Note  I connected with Cameron Smith on 03/18/19 at  9:30 AM EDT by telephone and verified that I am speaking with the correct person using two identifiers.   I discussed the limitations, risks, security and privacy concerns of performing an evaluation and management service by telephone and the availability of in person appointments. I also discussed with the patient that there may be a patient responsible charge related to this service. The patient expressed understanding and agreed to proceed.   History of Present Illness:    Observations/Objective:   Assessment and Plan:   Follow Up Instructions:    I discussed the assessment and treatment plan with the patient. The patient was provided an opportunity to ask questions and all were answered. The patient agreed with the plan and demonstrated an understanding of the instructions.   The patient was advised to call back or seek an in-person evaluation if the symptoms worsen or if the condition fails to improve as anticipated.  I provided25 minutes of non-face-to-face time during this encounter. Patient continues to take lipid medication regularly. No obvious side effects from it. Generally does not miss a dose. Prior blood work results are reviewed with patient. Patient continues to work on fat intake in diet  Blood pressure  medicine and blood pressure levels reviewed today with patient. Compliant with blood pressure medicine. States does not miss a dose. No obvious side effects. Blood pressure generally good when checked elsewhere. Watching salt intake.   Hypothyroidism.  Patient claims compliance with medication.  Does not miss a dose.  Feels that his thyroid overall is in good control.  Patient reports his blood pressures been suboptimal over the last 6 months.  Numerous numbers are elevated particularly in the systolic fashion    Review of Systems No headache, no major weight loss or weight gain, no chest pain no back pain abdominal pain no change in bowel habits complete ROS otherwise negative     Objective:   Physical Exam  Virtual visit      Assessment & Plan:  Impression hypertension.  Suboptimal control discussed we will add low-dose Norvasc.  To maintain other meds.  2.  Hyperlipidemia.  Prior blood work reviewed.  Maintain same meds  3.  Hypothyroidism.  TSH good will maintain same  4.  Type 2 diabetes.  Glucose is overall okay.  A1c excellent  Diet exercise discussed medications refilled.  Compliance discussed.  Recheck in 6 months for wellness plus chronic

## 2019-03-19 MED ORDER — LEVOTHYROXINE SODIUM 88 MCG PO TABS: ORAL_TABLET | ORAL | 3 refills | Status: DC

## 2019-04-01 LAB — SPECIMEN STATUS REPORT

## 2019-04-09 LAB — TSH: TSH: 2.26 u[IU]/mL (ref 0.450–4.500)

## 2019-04-09 LAB — SPECIMEN STATUS REPORT

## 2019-05-20 DIAGNOSIS — H02006 Unspecified entropion of left eye, unspecified eyelid: Secondary | ICD-10-CM | POA: Diagnosis not present

## 2019-05-26 ENCOUNTER — Ambulatory Visit (INDEPENDENT_AMBULATORY_CARE_PROVIDER_SITE_OTHER): Payer: Medicare Other | Admitting: Family Medicine

## 2019-05-26 ENCOUNTER — Other Ambulatory Visit: Payer: Self-pay

## 2019-05-26 DIAGNOSIS — J329 Chronic sinusitis, unspecified: Secondary | ICD-10-CM | POA: Diagnosis not present

## 2019-05-26 DIAGNOSIS — J4521 Mild intermittent asthma with (acute) exacerbation: Secondary | ICD-10-CM

## 2019-05-26 DIAGNOSIS — J310 Chronic rhinitis: Secondary | ICD-10-CM

## 2019-05-26 MED ORDER — CEFPROZIL 500 MG PO TABS: 500.0000 mg | ORAL_TABLET | Freq: Two times a day (BID) | ORAL | 0 refills | Status: DC

## 2019-05-26 MED ORDER — ALBUTEROL SULFATE HFA 108 (90 BASE) MCG/ACT IN AERS: 2.0000 | INHALATION_SPRAY | Freq: Four times a day (QID) | RESPIRATORY_TRACT | 0 refills | Status: DC | PRN

## 2019-05-26 NOTE — Progress Notes (Signed)
   Subjective:    Patient ID: Cameron Smith, male    DOB: [birth year], 78 y.o.   MRN: [id number] Audio only Sinusitis This is a new problem. Episode onset: 3-4 weeks. There has been no fever. Associated symptoms include congestion. (Drainage is clear but last couple days it has thickened up) Treatments tried: mucinex.   Virtual Visit via Telephone Note  I connected with Cameron Smith on 05/26/19 at 10:00 AM EDT by telephone and verified that I am speaking with the correct person using two identifiers.  Location: Patient: home Provider: office   I discussed the limitations, risks, security and privacy concerns of performing an evaluation and management service by telephone and the availability of in person appointments. I also discussed with the patient that there may be a patient responsible charge related to this service. The patient expressed understanding and agreed to proceed.   History of Present Illness:    Observations/Objective:   Assessment and Plan:   Follow Up Instructions:    I discussed the assessment and treatment plan with the patient. The patient was provided an opportunity to ask questions and all were answered. The patient agreed with the plan and demonstrated an understanding of the instructions.   The patient was advised to call back or seek an in-person evaluation if the symptoms worsen or if the condition fails to improve as anticipated.  I provided 18 minutes of non-face-to-face time during this encounter.   Using mucinex  No fever  Gunky runny nose  Frontal headache pressure stuffiness at times.  Patient also notes wheezing.  Intermittent in nature.  More noticeable at night.  Leads to cough and sense of wheeziness Review of Systems  HENT: Positive for congestion.        Objective:   Physical Exam  Virtual visit      Assessment & Plan:  Impression subacute rhinosinusitis with reactive airways plan albuterol 2 sprays 4 times daily as  needed.  Antibiotics prescribed.  Symptom care discussed.  Warning signs discussed

## 2019-05-27 DIAGNOSIS — H01134 Eczematous dermatitis of left upper eyelid: Secondary | ICD-10-CM | POA: Diagnosis not present

## 2019-05-27 DIAGNOSIS — Z9889 Other specified postprocedural states: Secondary | ICD-10-CM | POA: Diagnosis not present

## 2019-05-27 DIAGNOSIS — H01131 Eczematous dermatitis of right upper eyelid: Secondary | ICD-10-CM | POA: Diagnosis not present

## 2019-05-27 DIAGNOSIS — H02831 Dermatochalasis of right upper eyelid: Secondary | ICD-10-CM | POA: Diagnosis not present

## 2019-05-27 DIAGNOSIS — H2513 Age-related nuclear cataract, bilateral: Secondary | ICD-10-CM | POA: Diagnosis not present

## 2019-05-27 DIAGNOSIS — H02834 Dermatochalasis of left upper eyelid: Secondary | ICD-10-CM | POA: Diagnosis not present

## 2019-05-27 DIAGNOSIS — H40013 Open angle with borderline findings, low risk, bilateral: Secondary | ICD-10-CM | POA: Diagnosis not present

## 2019-06-03 ENCOUNTER — Telehealth: Payer: Self-pay | Admitting: Family Medicine

## 2019-06-03 MED ORDER — HYDROCODONE-HOMATROPINE 5-1.5 MG/5ML PO SYRP: ORAL_SOLUTION | ORAL | 0 refills | Status: DC

## 2019-06-03 MED ORDER — CEFDINIR 300 MG PO CAPS: ORAL_CAPSULE | ORAL | 0 refills | Status: DC

## 2019-06-03 NOTE — Telephone Encounter (Signed)
Patient is still having congestion and drainage,cough. He was seen on 7/6 and wanting another antibiotic called in and also something for cough. He is requesting hydrocodone cough syrup.Please Advise

## 2019-06-03 NOTE — Telephone Encounter (Signed)
omnicef 300 bid ten d  Hycodan 3 oz one tson qhs prn cough

## 2019-06-03 NOTE — Telephone Encounter (Signed)
Cough more at night time. Starting to be a productive cough. Pt has been wheezing; no trouble breathing or shortness of breath. Pt states he has not been feeling bad at all. Pt would like another antibiotic and cough syrup. Please advise. Thank you  Larene Pickett

## 2019-06-04 NOTE — Telephone Encounter (Addendum)
Patient picked up medications per [facility]

## 2019-06-09 ENCOUNTER — Other Ambulatory Visit: Payer: Self-pay

## 2019-06-09 ENCOUNTER — Ambulatory Visit (INDEPENDENT_AMBULATORY_CARE_PROVIDER_SITE_OTHER): Payer: Medicare Other | Admitting: Family Medicine

## 2019-06-09 DIAGNOSIS — J329 Chronic sinusitis, unspecified: Secondary | ICD-10-CM

## 2019-06-09 DIAGNOSIS — J4521 Mild intermittent asthma with (acute) exacerbation: Secondary | ICD-10-CM

## 2019-06-09 MED ORDER — DOXYCYCLINE HYCLATE 100 MG PO TABS: 100.0000 mg | ORAL_TABLET | Freq: Two times a day (BID) | ORAL | 0 refills | Status: DC

## 2019-06-09 MED ORDER — ALBUTEROL SULFATE (2.5 MG/3ML) 0.083% IN NEBU: 2.5000 mg | INHALATION_SOLUTION | Freq: Three times a day (TID) | RESPIRATORY_TRACT | 0 refills | Status: DC | PRN

## 2019-06-09 MED ORDER — PREDNISONE 10 MG PO TABS: ORAL_TABLET | ORAL | 0 refills | Status: DC

## 2019-06-09 NOTE — Progress Notes (Addendum)
   Subjective:  Audio plus video  Patient ID: Cameron Smith, male    DOB: December 14, 1940, 78 y.o.   MRN: [id number]  Sinusitis This is a new problem. Episode onset: 23 days ago. There has been no fever. (Nose stopped up, coughing up brown thick mucus, ) Treatments tried: cefzil, omnicef, hycodan, mucinex.   Virtual Visit via Telephone Note  I connected with Cameron Smith on 06/09/19 at 10:00 AM EDT by telephone and verified that I am speaking with the correct person using two identifiers.  Location: Patient: home Provider: office   I discussed the limitations, risks, security and privacy concerns of performing an evaluation and management service by telephone and the availability of in person appointments. I also discussed with the patient that there may be a patient responsible charge related to this service. The patient expressed understanding and agreed to proceed.   History of Present Illness:    Observations/Objective:   Assessment and Plan:   Follow Up Instructions:    I discussed the assessment and treatment plan with the patient. The patient was provided an opportunity to ask questions and all were answered. The patient agreed with the plan and demonstrated an understanding of the instructions.   The patient was advised to call back or seek an in-person evaluation if the symptoms worsen or if the condition fails to improve as anticipated.  I provided 20 minutes of non-face-to-face time during this encounter.      Review of Systems No headache, no major weight loss or weight gain, no chest pain no back pain abdominal pain no change in bowel habits complete ROS otherwise negative     Objective:   Physical Exam   Virtual     Assessment & Plan:  Impression persistent bronchitis with prolonged and substantial reactive airways.  Discussed with patient.  Will add prednisone taper.  Will change antibiotic once again.  With ongoing development of substantial reactive  airways.  Not responding well to current intervention.  We will also recommend nebulizer therapy.  And prescribed a nebulizer.  This should assist him in getting over his symptoms  Patient did bring up idea COVID-19 virus.  With symptoms this many weeks and testing would not be worthwhile.  Also doubt COVID-19 with his relatively mild course though advised patient I cannot be 100% sure

## 2019-06-11 ENCOUNTER — Encounter: Payer: Self-pay | Admitting: Family Medicine

## 2019-06-19 ENCOUNTER — Other Ambulatory Visit: Payer: Self-pay

## 2019-06-20 ENCOUNTER — Encounter (HOSPITAL_COMMUNITY): Payer: Self-pay

## 2019-06-20 ENCOUNTER — Other Ambulatory Visit: Payer: Self-pay | Admitting: *Deleted

## 2019-06-20 ENCOUNTER — Other Ambulatory Visit: Payer: Self-pay

## 2019-06-20 ENCOUNTER — Telehealth: Payer: Self-pay | Admitting: Family Medicine

## 2019-06-20 ENCOUNTER — Ambulatory Visit (HOSPITAL_COMMUNITY)
Admission: RE | Admit: 2019-06-20 | Discharge: 2019-06-20 | Disposition: A | Discharge status: Home / Self Care | Payer: Medicare Other | Source: Ambulatory Visit | Attending: Family Medicine | Admitting: Family Medicine

## 2019-06-20 DIAGNOSIS — R059 Cough, unspecified: Secondary | ICD-10-CM

## 2019-06-20 DIAGNOSIS — R05 Cough: Secondary | ICD-10-CM | POA: Diagnosis not present

## 2019-06-20 MED ORDER — AMOXICILLIN-POT CLAVULANATE 875-125 MG PO TABS: 1.0000 | ORAL_TABLET | Freq: Two times a day (BID) | ORAL | 0 refills | Status: DC

## 2019-06-20 NOTE — Telephone Encounter (Signed)
No pneumonia Take the Augmentin with a snack twice a day for the next 10 days If ongoing troubles follow-up

## 2019-06-20 NOTE — Telephone Encounter (Signed)
Cough cleared up for a couple of days then came back, wheezing. Better during the day then at night nose stops up. Coughing up med to dark brown stuff this morning. Has been on two antibiotics and prednisone. Using nebulizer 4 times a day. Would like to get a chest xray if dr Nicki Reaper thinks he needs one.

## 2019-06-20 NOTE — Telephone Encounter (Signed)
Patient had virtual visit on 7/6 for coughing and was given antibiotic and 7/20 given another antibiotic but not helping still has cough. Patient is requesting chest x-ray. Please advise

## 2019-06-20 NOTE — Telephone Encounter (Signed)
Order stat chest x-ray Explained to the patient if he can get it done today we may have results for later today Also some individuals with bronchitis can have cough that lasts for weeks Given that he is coughing up discolored phlegm we will add an additional antibiotic Augmentin 875 1 twice daily for 10 days if high fevers difficulty breathing or worse may need to go to ER

## 2019-06-20 NOTE — Telephone Encounter (Signed)
Pt contacted and verbalized understanding.  

## 2019-06-20 NOTE — Telephone Encounter (Signed)
Stat cxr results in epic for review 

## 2019-06-20 NOTE — Telephone Encounter (Signed)
Discussed with pt. Order for stat cxr put in for pt to do today at [facility]. Med sent to pharm. Pt verbalized understanding of all.

## 2019-06-23 ENCOUNTER — Telehealth: Payer: Self-pay | Admitting: Family Medicine

## 2019-06-23 NOTE — Telephone Encounter (Signed)
Please complete highlighted areas, sign & date so I may fax back  In yellow box on wall

## 2019-06-24 DIAGNOSIS — J324 Chronic pansinusitis: Secondary | ICD-10-CM | POA: Diagnosis not present

## 2019-06-25 ENCOUNTER — Telehealth: Payer: Self-pay | Admitting: Family Medicine

## 2019-06-25 NOTE — Telephone Encounter (Signed)
[facility] needing face to face current  notes on patient. Was sure what date to send to them . Please complete order in your yellow folder.

## 2019-06-25 NOTE — Telephone Encounter (Signed)
Please advise. Thank you

## 2019-07-01 DIAGNOSIS — H02831 Dermatochalasis of right upper eyelid: Secondary | ICD-10-CM | POA: Diagnosis not present

## 2019-07-01 DIAGNOSIS — H02834 Dermatochalasis of left upper eyelid: Secondary | ICD-10-CM | POA: Diagnosis not present

## 2019-07-01 DIAGNOSIS — H40013 Open angle with borderline findings, low risk, bilateral: Secondary | ICD-10-CM | POA: Diagnosis not present

## 2019-07-01 DIAGNOSIS — H40033 Anatomical narrow angle, bilateral: Secondary | ICD-10-CM | POA: Diagnosis not present

## 2019-07-12 ENCOUNTER — Other Ambulatory Visit: Payer: Self-pay | Admitting: Family Medicine

## 2019-07-14 NOTE — Telephone Encounter (Signed)
Six mo all 

## 2019-07-16 DIAGNOSIS — J324 Chronic pansinusitis: Secondary | ICD-10-CM | POA: Diagnosis not present

## 2019-07-16 DIAGNOSIS — J329 Chronic sinusitis, unspecified: Secondary | ICD-10-CM | POA: Diagnosis not present

## 2019-07-22 ENCOUNTER — Institutional Professional Consult (permissible substitution): Payer: Medicare Other | Admitting: Critical Care Medicine

## 2019-08-04 ENCOUNTER — Telehealth: Payer: Self-pay | Admitting: Family Medicine

## 2019-08-04 DIAGNOSIS — J329 Chronic sinusitis, unspecified: Secondary | ICD-10-CM

## 2019-08-04 DIAGNOSIS — J310 Chronic rhinitis: Secondary | ICD-10-CM

## 2019-08-04 NOTE — Telephone Encounter (Signed)
Referral ordered in EPIC. 

## 2019-08-04 NOTE — Telephone Encounter (Signed)
Patient is still having issues with his sinuses and wants a referral to ears,nose throat as soon as possible please because he has been on two antibiotics and it hasnt helped. Please advise

## 2019-08-04 NOTE — Telephone Encounter (Signed)
Let's do 

## 2019-08-08 DIAGNOSIS — J343 Hypertrophy of nasal turbinates: Secondary | ICD-10-CM | POA: Diagnosis not present

## 2019-08-08 DIAGNOSIS — J324 Chronic pansinusitis: Secondary | ICD-10-CM | POA: Diagnosis not present

## 2019-08-08 DIAGNOSIS — Z87891 Personal history of nicotine dependence: Secondary | ICD-10-CM | POA: Diagnosis not present

## 2019-08-08 DIAGNOSIS — J342 Deviated nasal septum: Secondary | ICD-10-CM | POA: Diagnosis not present

## 2019-08-13 ENCOUNTER — Encounter: Payer: Self-pay | Admitting: Family Medicine

## 2019-08-20 ENCOUNTER — Ambulatory Visit (INDEPENDENT_AMBULATORY_CARE_PROVIDER_SITE_OTHER): Payer: Medicare Other | Admitting: Family Medicine

## 2019-08-20 ENCOUNTER — Other Ambulatory Visit: Payer: Self-pay

## 2019-08-20 DIAGNOSIS — R05 Cough: Secondary | ICD-10-CM

## 2019-08-20 DIAGNOSIS — J4541 Moderate persistent asthma with (acute) exacerbation: Secondary | ICD-10-CM

## 2019-08-20 DIAGNOSIS — R053 Chronic cough: Secondary | ICD-10-CM

## 2019-08-20 MED ORDER — FLOVENT HFA 44 MCG/ACT IN AERO: 2.0000 | INHALATION_SPRAY | Freq: Two times a day (BID) | RESPIRATORY_TRACT | 0 refills | Status: DC

## 2019-08-20 NOTE — Progress Notes (Signed)
   Subjective:  Audio only  Patient ID: Cameron Smith, male    DOB: August 07, 1941, 78 y.o.   MRN: [id number]  Sinus Problem This is a new problem. The current episode started 1 to 4 weeks ago. Associated symptoms include congestion and coughing.   Patient states he is going to specialist for the sinus sx ut is concerned about his ongoing cough    Review of Systems  HENT: Positive for congestion.   Respiratory: Positive for cough.    Virtual Visit via Video Note  I connected with Cameron Smith on 08/20/19 at  3:50 PM EDT by a video enabled telemedicine application and verified that I am speaking with the correct person using two identifiers.  Location: Patient: home Provider: office   I discussed the limitations of evaluation and management by telemedicine and the availability of in person appointments. The patient expressed understanding and agreed to proceed.  History of Present Illness:    Observations/Objective:   Assessment and Plan:   Follow Up Instructions:    I discussed the assessment and treatment plan with the patient. The patient was provided an opportunity to ask questions and all were answered. The patient agreed with the plan and demonstrated an understanding of the instructions.   The patient was advised to call back or seek an in-person evaluation if the symptoms worsen or if the condition fails to improve as anticipated.  I provided 25 minutes of non-face-to-face time during this encounter.   Patient notes ongoing recurrent sinus difficulties.  Went on to see the ENT specialist.  In fact saw a couple.  He is now scheduled for surgery for sinus intervention discussed  Patient notes ongoing cough now for several months.  Definite wheezy texture.  Albuterol helps some but then it recurs.  Patient did smoke for 20 years as an adult.  Also smoking exposure as a child.  Recalls no history of asthma as a child.  Patient makes very clear he is extremely  frustrated by his chronic cough. No headache no chest pain no abdominal pain no change in bowel habits    Objective:   Physical Exam  Virtual      Assessment & Plan:  Impression probable adult onset asthma.  Discussed.  Will add steroid inhaler.  Proper use discussed.  High cost discussed.  Pulmonary referral due to patient's desire for [place] doing absolutely everything for this cough which is understandable regarding how aggravating it is  Greater than 50% of this 25 minute face to face visit was spent in counseling and discussion and coordination of care regarding the above diagnosis/diagnosies

## 2019-08-21 ENCOUNTER — Encounter (HOSPITAL_COMMUNITY): Payer: Self-pay | Admitting: *Deleted

## 2019-08-21 ENCOUNTER — Telehealth: Payer: Self-pay | Admitting: Family Medicine

## 2019-08-21 ENCOUNTER — Other Ambulatory Visit: Payer: Self-pay | Admitting: Otolaryngology

## 2019-08-21 ENCOUNTER — Other Ambulatory Visit (HOSPITAL_COMMUNITY)
Admission: RE | Admit: 2019-08-21 | Discharge: 2019-08-21 | Disposition: A | Discharge status: Home / Self Care | Payer: Medicare Other | Source: Ambulatory Visit | Attending: Otolaryngology | Admitting: Otolaryngology

## 2019-08-21 ENCOUNTER — Other Ambulatory Visit: Payer: Self-pay

## 2019-08-21 DIAGNOSIS — Z01812 Encounter for preprocedural laboratory examination: Secondary | ICD-10-CM | POA: Insufficient documentation

## 2019-08-21 DIAGNOSIS — Z20828 Contact with and (suspected) exposure to other viral communicable diseases: Secondary | ICD-10-CM | POA: Insufficient documentation

## 2019-08-21 LAB — SARS CORONAVIRUS 2 (TAT 6-24 HRS): SARS Coronavirus 2: NEGATIVE

## 2019-08-21 NOTE — Progress Notes (Signed)
   08/21/19 1445  OBSTRUCTIVE SLEEP APNEA  Have you ever been diagnosed with sleep apnea through a sleep study? No  Do you snore loudly (loud enough to be heard through closed doors)?  1  Do you often feel tired, fatigued, or sleepy during the daytime (such as falling asleep during driving or talking to someone)? 0  Has anyone observed you stop breathing during your sleep? 1  Do you have, or are you being treated for high blood pressure? 1  BMI more than 35 kg/m2? 0  Age > 69 (1-yes) 1  Male Gender (Yes=1) 1  Obstructive Sleep Apnea Score 5  Score 5 or greater  Results sent to PCP

## 2019-08-21 NOTE — Telephone Encounter (Signed)
Patient is requesting prescription for big bottle of hydrocodone cough syrup 240 ml for his bad cough. [facility]

## 2019-08-22 ENCOUNTER — Ambulatory Visit (HOSPITAL_COMMUNITY): Payer: Medicare Other | Admitting: Anesthesiology

## 2019-08-22 ENCOUNTER — Encounter (HOSPITAL_COMMUNITY): Payer: Self-pay

## 2019-08-22 ENCOUNTER — Other Ambulatory Visit: Payer: Self-pay

## 2019-08-22 ENCOUNTER — Encounter (HOSPITAL_COMMUNITY)
Admission: RE | Disposition: A | Discharge status: Home / Self Care | Payer: Self-pay | Source: Home / Self Care | Attending: Otolaryngology

## 2019-08-22 ENCOUNTER — Ambulatory Visit (HOSPITAL_COMMUNITY)
Admission: RE | Admit: 2019-08-22 | Discharge: 2019-08-22 | Disposition: A | Discharge status: Home / Self Care | Payer: Medicare Other | Attending: Otolaryngology | Admitting: Otolaryngology

## 2019-08-22 DIAGNOSIS — J324 Chronic pansinusitis: Secondary | ICD-10-CM | POA: Diagnosis not present

## 2019-08-22 DIAGNOSIS — Z79890 Hormone replacement therapy (postmenopausal): Secondary | ICD-10-CM | POA: Insufficient documentation

## 2019-08-22 DIAGNOSIS — J342 Deviated nasal septum: Secondary | ICD-10-CM | POA: Insufficient documentation

## 2019-08-22 DIAGNOSIS — J329 Chronic sinusitis, unspecified: Secondary | ICD-10-CM | POA: Diagnosis not present

## 2019-08-22 DIAGNOSIS — Z7982 Long term (current) use of aspirin: Secondary | ICD-10-CM | POA: Insufficient documentation

## 2019-08-22 DIAGNOSIS — Z87891 Personal history of nicotine dependence: Secondary | ICD-10-CM | POA: Diagnosis not present

## 2019-08-22 DIAGNOSIS — E039 Hypothyroidism, unspecified: Secondary | ICD-10-CM | POA: Insufficient documentation

## 2019-08-22 DIAGNOSIS — I10 Essential (primary) hypertension: Secondary | ICD-10-CM | POA: Diagnosis not present

## 2019-08-22 DIAGNOSIS — J343 Hypertrophy of nasal turbinates: Secondary | ICD-10-CM | POA: Insufficient documentation

## 2019-08-22 DIAGNOSIS — J323 Chronic sphenoidal sinusitis: Secondary | ICD-10-CM | POA: Diagnosis not present

## 2019-08-22 DIAGNOSIS — E7800 Pure hypercholesterolemia, unspecified: Secondary | ICD-10-CM | POA: Diagnosis not present

## 2019-08-22 DIAGNOSIS — F419 Anxiety disorder, unspecified: Secondary | ICD-10-CM | POA: Diagnosis not present

## 2019-08-22 DIAGNOSIS — J322 Chronic ethmoidal sinusitis: Secondary | ICD-10-CM | POA: Diagnosis not present

## 2019-08-22 DIAGNOSIS — Z79899 Other long term (current) drug therapy: Secondary | ICD-10-CM | POA: Insufficient documentation

## 2019-08-22 HISTORY — PX: NASAL SEPTOPLASTY W/ TURBINOPLASTY: SHX2070

## 2019-08-22 HISTORY — DX: Acute sinusitis, unspecified: J01.90

## 2019-08-22 HISTORY — DX: Hypertrophy of nasal turbinates: J34.3

## 2019-08-22 LAB — CBC
HCT: 45.4 % (ref 39.0–52.0)
Hemoglobin: 15.4 g/dL (ref 13.0–17.0)
MCH: 33.3 pg (ref 26.0–34.0)
MCHC: 33.9 g/dL (ref 30.0–36.0)
MCV: 98.3 fL (ref 80.0–100.0)
Platelets: 189 10*3/uL (ref 150–400)
RBC: 4.62 MIL/uL (ref 4.22–5.81)
RDW: 12.5 % (ref 11.5–15.5)
WBC: 6.1 10*3/uL (ref 4.0–10.5)
nRBC: 0 % (ref 0.0–0.2)

## 2019-08-22 LAB — BASIC METABOLIC PANEL
Anion gap: 11 (ref 5–15)
BUN: 15 mg/dL (ref 8–23)
CO2: 24 mmol/L (ref 22–32)
Calcium: 9.3 mg/dL (ref 8.9–10.3)
Chloride: 106 mmol/L (ref 98–111)
Creatinine, Ser: 0.85 mg/dL (ref 0.61–1.24)
GFR calc Af Amer: >60 mL/min (ref >60)
GFR calc non Af Amer: >60 mL/min (ref >60)
Glucose, Bld: 108 mg/dL — ABNORMAL HIGH (ref 70–99)
Potassium: 4.1 mmol/L (ref 3.5–5.1)
Sodium: 141 mmol/L (ref 135–145)

## 2019-08-22 SURGERY — SEPTOPLASTY, NOSE, WITH NASAL TURBINATE REDUCTION
Anesthesia: General | Laterality: Bilateral

## 2019-08-22 MED ORDER — LACTATED RINGERS IV SOLN
INTRAVENOUS | Status: DC
  Administered 2019-08-22: 08:00:00 via INTRAVENOUS

## 2019-08-22 MED ORDER — GLYCOPYRROLATE PF 0.2 MG/ML IJ SOSY
PREFILLED_SYRINGE | INTRAMUSCULAR | Status: AC
  Filled 2019-08-22: qty 1

## 2019-08-22 MED ORDER — 0.9 % SODIUM CHLORIDE (POUR BTL) OPTIME
TOPICAL | Status: DC | PRN
  Administered 2019-08-22: 1000 mL

## 2019-08-22 MED ORDER — GLYCOPYRROLATE 0.2 MG/ML IJ SOLN
INTRAMUSCULAR | Status: DC | PRN
  Administered 2019-08-22: 0.2 mg via INTRAVENOUS

## 2019-08-22 MED ORDER — LIDOCAINE HCL (CARDIAC) PF 100 MG/5ML IV SOSY
PREFILLED_SYRINGE | INTRAVENOUS | Status: DC | PRN
  Administered 2019-08-22: 30 mg via INTRAVENOUS

## 2019-08-22 MED ORDER — OXYMETAZOLINE HCL 0.05 % NA SOLN
NASAL | Status: DC | PRN
  Administered 2019-08-22: 1 via TOPICAL

## 2019-08-22 MED ORDER — ROCURONIUM BROMIDE 10 MG/ML (PF) SYRINGE
PREFILLED_SYRINGE | INTRAVENOUS | Status: AC
  Filled 2019-08-22: qty 10

## 2019-08-22 MED ORDER — PROPOFOL 10 MG/ML IV BOLUS
INTRAVENOUS | Status: AC
  Filled 2019-08-22: qty 20

## 2019-08-22 MED ORDER — SUCCINYLCHOLINE CHLORIDE 20 MG/ML IJ SOLN
INTRAMUSCULAR | Status: DC | PRN
  Administered 2019-08-22: 100 mg via INTRAVENOUS

## 2019-08-22 MED ORDER — PHENYLEPHRINE 40 MCG/ML (10ML) SYRINGE FOR IV PUSH (FOR BLOOD PRESSURE SUPPORT)
PREFILLED_SYRINGE | INTRAVENOUS | Status: AC
  Filled 2019-08-22: qty 10

## 2019-08-22 MED ORDER — LIDOCAINE 2% (20 MG/ML) 5 ML SYRINGE
INTRAMUSCULAR | Status: AC
  Filled 2019-08-22: qty 5

## 2019-08-22 MED ORDER — FENTANYL CITRATE (PF) 250 MCG/5ML IJ SOLN
INTRAMUSCULAR | Status: AC
  Filled 2019-08-22: qty 5

## 2019-08-22 MED ORDER — HEMOSTATIC AGENTS (NO CHARGE) OPTIME
TOPICAL | Status: DC | PRN
  Administered 2019-08-22: 1 via TOPICAL

## 2019-08-22 MED ORDER — ONDANSETRON HCL 4 MG/2ML IJ SOLN
INTRAMUSCULAR | Status: AC
  Filled 2019-08-22: qty 2

## 2019-08-22 MED ORDER — DEXAMETHASONE SODIUM PHOSPHATE 10 MG/ML IJ SOLN
INTRAMUSCULAR | Status: DC | PRN
  Administered 2019-08-22: 10 mg via INTRAVENOUS

## 2019-08-22 MED ORDER — LIDOCAINE-EPINEPHRINE 1 %-1:100000 IJ SOLN
INTRAMUSCULAR | Status: AC
  Filled 2019-08-22: qty 1

## 2019-08-22 MED ORDER — MUPIROCIN 2 % EX OINT
TOPICAL_OINTMENT | CUTANEOUS | Status: DC | PRN
  Administered 2019-08-22: 1 via NASAL

## 2019-08-22 MED ORDER — SUCCINYLCHOLINE CHLORIDE 200 MG/10ML IV SOSY
PREFILLED_SYRINGE | INTRAVENOUS | Status: AC
  Filled 2019-08-22: qty 10

## 2019-08-22 MED ORDER — FENTANYL CITRATE (PF) 100 MCG/2ML IJ SOLN
INTRAMUSCULAR | Status: DC | PRN
  Administered 2019-08-22: 100 ug via INTRAVENOUS

## 2019-08-22 MED ORDER — ONDANSETRON HCL 4 MG/2ML IJ SOLN
INTRAMUSCULAR | Status: DC | PRN
  Administered 2019-08-22: 4 mg via INTRAVENOUS

## 2019-08-22 MED ORDER — OXYMETAZOLINE HCL 0.05 % NA SOLN
NASAL | Status: AC
  Filled 2019-08-22: qty 30

## 2019-08-22 MED ORDER — DEXAMETHASONE SODIUM PHOSPHATE 10 MG/ML IJ SOLN
INTRAMUSCULAR | Status: AC
  Filled 2019-08-22: qty 1

## 2019-08-22 MED ORDER — PHENYLEPHRINE HCL (PRESSORS) 10 MG/ML IV SOLN
INTRAVENOUS | Status: DC | PRN
  Administered 2019-08-22: 100 ug via INTRAVENOUS

## 2019-08-22 MED ORDER — CEFAZOLIN SODIUM-DEXTROSE 2-4 GM/100ML-% IV SOLN
2.0000 g | INTRAVENOUS | Status: AC
  Administered 2019-08-22: 09:00:00 2 g via INTRAVENOUS

## 2019-08-22 MED ORDER — SODIUM CHLORIDE 0.9 % IR SOLN
Status: DC | PRN
  Administered 2019-08-22: 1000 mL

## 2019-08-22 MED ORDER — MUPIROCIN 2 % EX OINT
TOPICAL_OINTMENT | CUTANEOUS | Status: AC
  Filled 2019-08-22: qty 22

## 2019-08-22 MED ORDER — SODIUM CHLORIDE 0.9 % IV SOLN
INTRAVENOUS | Status: DC | PRN
  Administered 2019-08-22: 09:00:00 50 ug/min via INTRAVENOUS

## 2019-08-22 MED ORDER — LIDOCAINE-EPINEPHRINE 1 %-1:100000 IJ SOLN
INTRAMUSCULAR | Status: DC | PRN
  Administered 2019-08-22: 20 mL

## 2019-08-22 MED ORDER — ROCURONIUM BROMIDE 50 MG/5ML IV SOSY
PREFILLED_SYRINGE | INTRAVENOUS | Status: DC | PRN
  Administered 2019-08-22: 40 mg via INTRAVENOUS

## 2019-08-22 MED ORDER — CEFAZOLIN SODIUM-DEXTROSE 2-4 GM/100ML-% IV SOLN: 2.0000 g | INTRAVENOUS | Status: DC

## 2019-08-22 MED ORDER — SUGAMMADEX SODIUM 200 MG/2ML IV SOLN
INTRAVENOUS | Status: DC | PRN
  Administered 2019-08-22: 200 mg via INTRAVENOUS

## 2019-08-22 MED ORDER — CEFAZOLIN SODIUM-DEXTROSE 2-4 GM/100ML-% IV SOLN
INTRAVENOUS | Status: AC
  Filled 2019-08-22: qty 100

## 2019-08-22 SURGICAL SUPPLY — 36 items
BLADE INF TURB ROT M4 2 5PK (BLADE) ×2 IMPLANT
BLADE INF TURB ROT M4 2MM 5PK (BLADE) ×1
BLADE RAD60 ROTATE M4 4 5PK (BLADE) ×1 IMPLANT
BLADE RAD60 ROTATE M4 4MM 5PK (BLADE) ×1
BLADE TRICUT ROTATE M4 4 5PK (BLADE) ×1 IMPLANT
BLADE TRICUT ROTATE M4 4MM 5PK (BLADE) ×1
CANISTER SUCT 3000ML PPV (MISCELLANEOUS) ×3 IMPLANT
CLOSURE WOUND 1/2 X4 (GAUZE/BANDAGES/DRESSINGS) ×1
COAGULATOR SUCT 8FR VV (MISCELLANEOUS) ×2 IMPLANT
COVER SURGICAL LIGHT HANDLE (MISCELLANEOUS) ×2 IMPLANT
COVER WAND RF STERILE (DRAPES) ×1 IMPLANT
DEFOGGER ANTIFOG KIT (MISCELLANEOUS) ×2 IMPLANT
DRAPE HALF SHEET 40X57 (DRAPES) ×3 IMPLANT
DRSG NASOPORE 8CM (GAUZE/BANDAGES/DRESSINGS) ×2 IMPLANT
DRSG TELFA 3X8 NADH (GAUZE/BANDAGES/DRESSINGS) ×3 IMPLANT
GLOVE BIO SURGEON STRL SZ7.5 (GLOVE) ×3 IMPLANT
GOWN STRL REUS W/ TWL LRG LVL3 (GOWN DISPOSABLE) ×2 IMPLANT
GOWN STRL REUS W/TWL LRG LVL3 (GOWN DISPOSABLE) ×6
KIT BASIN OR (CUSTOM PROCEDURE TRAY) ×3 IMPLANT
KIT TURNOVER KIT B (KITS) ×3 IMPLANT
NDL HYPO 25GX1X1/2 BEV (NEEDLE) IMPLANT
NDL HYPO 25X1 1.5 SAFETY (NEEDLE) ×1 IMPLANT
NDL SPNL 25GX3.5 QUINCKE BL (NEEDLE) IMPLANT
NEEDLE HYPO 25GX1X1/2 BEV (NEEDLE) IMPLANT
NEEDLE HYPO 25X1 1.5 SAFETY (NEEDLE) ×3 IMPLANT
NEEDLE SPNL 25GX3.5 QUINCKE BL (NEEDLE) ×3 IMPLANT
NS IRRIG 1000ML POUR BTL (IV SOLUTION) ×3 IMPLANT
PAD ARMBOARD 7.5X6 YLW CONV (MISCELLANEOUS) ×3 IMPLANT
PAD DRESSING TELFA 3X8 NADH (GAUZE/BANDAGES/DRESSINGS) ×1 IMPLANT
PATTIES SURGICAL .5 X3 (DISPOSABLE) ×3 IMPLANT
POSITIONER HEAD DONUT 9IN (MISCELLANEOUS) IMPLANT
SPLINT NASAL DOYLE BI-VL (GAUZE/BANDAGES/DRESSINGS) ×3 IMPLANT
STRIP CLOSURE SKIN 1/2X4 (GAUZE/BANDAGES/DRESSINGS) ×1 IMPLANT
SUT CHROMIC 4 0 PS 2 18 (SUTURE) ×3 IMPLANT
SUT CHROMIC GUT 2 0 PS 2 27 (SUTURE) ×3 IMPLANT
TRAY ENT MC OR (CUSTOM PROCEDURE TRAY) ×3 IMPLANT

## 2019-08-22 NOTE — Brief Op Note (Signed)
08/22/2019  11:06 AM  PATIENT:  Cameron Smith  78 y.o. male  PRE-OPERATIVE DIAGNOSIS:  chronic panasinusitis and turbinate hypertrophy, septal deviation  POST-OPERATIVE DIAGNOSIS:  chronic panasinusitis and turbinate hypertrophy, septal deviation  PROCEDURE:  Procedure(s): Sinus Endoscopy   Nasal Septoplasty with Bilateral TURBINATE REDUCTION, Bilateral Total Ethmoidectomy and Sphenoidotomy (Bilateral)  SURGEON:  Surgeon(s) and Role:    Melida Quitter, MD - Primary  PHYSICIAN ASSISTANT:   ASSISTANTS: none   ANESTHESIA:   general  EBL:  5 mL   BLOOD ADMINISTERED:none  DRAINS: none   LOCAL MEDICATIONS USED:  LIDOCAINE   SPECIMEN:  No Specimen  DISPOSITION OF SPECIMEN:  N/A  COUNTS:  YES  TOURNIQUET:  * No tourniquets in log *  DICTATION: .Other Dictation: Dictation Number [id number]  PLAN OF CARE: Discharge to home after PACU  PATIENT DISPOSITION:  PACU - hemodynamically stable.   Delay start of Pharmacological VTE agent (>24hrs) due to surgical blood loss or risk of bleeding: no

## 2019-08-22 NOTE — Transfer of Care (Signed)
Immediate Anesthesia Transfer of Care Note  Patient: Cameron Smith  Procedure(s) Performed: Sinus Endoscopy   Nasal Septoplasty with Bilateral TURBINATE REDUCTION, Bilateral Total Ethmoidectomy and Sphenoidotomy (Bilateral )  Patient Location: PACU  Anesthesia Type:General  Level of Consciousness: awake, alert  and oriented  Airway & Oxygen Therapy: Patient Spontanous Breathing and Patient connected to face mask oxygen  Post-op Assessment: Report given to RN and Post -op Vital signs reviewed and stable  Post vital signs: Reviewed and stable  Last Vitals:  Vitals Value Taken Time  BP 125/86 08/22/19 1120  Temp    Pulse 85 08/22/19 1121  Resp 18 08/22/19 1121  SpO2 93 % 08/22/19 1121  Vitals shown include unvalidated device data.  Last Pain:  Vitals:   08/22/19 0730  TempSrc:   PainSc: 0-No pain      Patients Stated Pain Goal: 3 ([id number])  Complications: No apparent anesthesia complications

## 2019-08-22 NOTE — Anesthesia Procedure Notes (Signed)
Procedure Name: Intubation Date/Time: 08/22/2019 9:17 AM Performed by: Eligha Bridegroom, CRNA Pre-anesthesia Checklist: Patient identified, Emergency Drugs available, Suction available, Patient being monitored and Timeout performed Patient Re-evaluated:Patient Re-evaluated prior to induction Oxygen Delivery Method: Circle system utilized Preoxygenation: Pre-oxygenation with 100% oxygen Induction Type: IV induction and Rapid sequence Laryngoscope Size: Mac and 4 Grade View: Grade I Tube type: Oral Tube size: 7.5 mm Number of attempts: 1 Airway Equipment and Method: Stylet Placement Confirmation: ETT inserted through vocal cords under direct vision,  positive ETCO2 and breath sounds checked- equal and bilateral Secured at: 20 cm Tube secured with: Tape Dental Injury: Teeth and Oropharynx as per pre-operative assessment

## 2019-08-22 NOTE — Anesthesia Preprocedure Evaluation (Signed)
Anesthesia Evaluation  Patient identified by MRN, date of birth, ID band Patient awake    Reviewed: Allergy & Precautions, NPO status , Patient's Chart, lab work & pertinent test results  Airway Mallampati: III  TM Distance: >3 FB Neck ROM: Full    Dental  (+) Dental Advisory Given, Edentulous Upper, Partial Lower   Pulmonary former smoker,    breath sounds clear to auscultation       Cardiovascular hypertension,  Rhythm:Regular Rate:Normal     Neuro/Psych    GI/Hepatic   Endo/Other    Renal/GU      Musculoskeletal   Abdominal (+) + obese,   Peds  Hematology   Anesthesia Other Findings   Reproductive/Obstetrics                             Anesthesia Physical Anesthesia Plan  ASA: III  Anesthesia Plan: General   Post-op Pain Management:    Induction: Intravenous  PONV Risk Score and Plan: Ondansetron and Dexamethasone  Airway Management Planned: Oral ETT  Additional Equipment:   Intra-op Plan:   Post-operative Plan: Extubation in OR  Informed Consent: I have reviewed the patients History and Physical, chart, labs and discussed the procedure including the risks, benefits and alternatives for the proposed anesthesia with the patient or authorized representative who has indicated his/her understanding and acceptance.       Plan Discussed with: CRNA and Anesthesiologist  Anesthesia Plan Comments:         Anesthesia Quick Evaluation

## 2019-08-22 NOTE — Addendum Note (Signed)
Addendum  created 08/22/19 1821 by Eligha Bridegroom, CRNA   Intraprocedure Event edited, Intraprocedure Meds edited

## 2019-08-22 NOTE — H&P (Signed)
Cameron Smith is an 78 y.o. male.   Chief Complaint: Chronic sinusitis and nasal obstruction HPI: 78 year old male underwent sinus surgery at least 10 years ago and has been dealing with productive cough and nasal obstruction worse again for the past several months in spite of medical therapy.  He presents for surgical management.  Past Medical History:  Diagnosis Date  . Acute inflammation of nasal sinus    Chronic  . Anxiety   . Hypercholesteremia   . Hypertension   . Hypothyroidism   . Nasal turbinate hypertrophy   . Reactive airways dysfunction syndrome [facility])     Past Surgical History:  Procedure Laterality Date  . BACK SURGERY     sciatic  . COLONOSCOPY  10/31/2011   Procedure: COLONOSCOPY;  Surgeon: Daneil Dolin, MD;  Location: [facility];  Service: Endoscopy;  Laterality: N/A;  10:30 AM  . NASAL SINUS SURGERY      Family History  Problem Relation Age of Onset  . Heart disease Father   . Diabetes Sister   . Colon cancer Neg Hx    Social History:  reports that he has quit smoking. He has a 48.00 pack-year smoking history. He has never used smokeless tobacco. He reports that he does not drink alcohol or use drugs.  Allergies:  Allergies  Allergen Reactions  . Flomax [Tamsulosin Hcl]     Dizzy     Medications Prior to Admission  Medication Sig Dispense Refill  . amLODipine (NORVASC) 2.5 MG tablet TAKE 1 TABLET BY MOUTH IN  THE MORNING (Patient taking differently: Take 2.5 mg by mouth daily. ) 90 tablet 1  . aspirin EC 81 MG tablet Take 81 mg by mouth every evening.    Marland Kitchen atorvastatin (LIPITOR) 40 MG tablet TAKE 1 TABLET BY MOUTH  DAILY (Patient taking differently: Take 40 mg by mouth every evening. ) 90 tablet 1  . Coenzyme Q10 ([employer] COQ-10) 200 MG capsule Take 200 mg by mouth every evening.    . fluticasone (FLOVENT HFA) 44 MCG/ACT inhaler Inhale 2 puffs into the lungs 2 (two) times daily. 1 Inhaler 0  . levothyroxine (SYNTHROID) 88 MCG tablet TAKE 1 TABLET BY  MOUTH  EVERY MORNING ON AN EMPTY  STOMACH (Patient taking differently: Take 88 mcg by mouth daily before breakfast. ) 90 tablet 3  . lisinopril (ZESTRIL) 40 MG tablet TAKE 1 TABLET BY MOUTH  DAILY (Patient taking differently: Take 40 mg by mouth every evening. ) 90 tablet 1  . vitamin E 400 UNIT capsule Take 400 Units by mouth every evening.     Marland Kitchen albuterol (PROVENTIL) (2.5 MG/3ML) 0.083% nebulizer solution Take 3 mLs (2.5 mg total) by nebulization 3 (three) times daily as needed for wheezing or shortness of breath. (Patient not taking: Reported on 08/20/2019) 150 mL 0  . albuterol (VENTOLIN HFA) 108 (90 Base) MCG/ACT inhaler Inhale 2 puffs into the lungs every 6 (six) hours as needed for wheezing or shortness of breath. (Patient not taking: Reported on 08/20/2019) 18 g 0  . amoxicillin-clavulanate (AUGMENTIN) 875-125 MG tablet Take 1 tablet by mouth 2 (two) times daily. (Patient not taking: Reported on 08/20/2019) 20 tablet 0  . cefdinir (OMNICEF) 300 MG capsule Take one capsule by mouth twice daily for 10 days (Patient not taking: Reported on 06/09/2019) 20 capsule 0  . cefPROZIL (CEFZIL) 500 MG tablet Take 1 tablet (500 mg total) by mouth 2 (two) times daily. (Patient not taking: Reported on 06/09/2019) 20  tablet 0  . doxycycline (VIBRA-TABS) 100 MG tablet Take 1 tablet (100 mg total) by mouth 2 (two) times daily. (Patient not taking: Reported on 08/20/2019) 20 tablet 0  . HYDROcodone-homatropine (HYCODAN) 5-1.5 MG/5ML syrup Take one tspn po qhs prn cough 90 mL 0  . meclizine (ANTIVERT) 25 MG tablet Take 1 tablet (25 mg total) by mouth 3 (three) times daily as needed for dizziness. 30 tablet 4  . mometasone (ELOCON) 0.1 % cream Apply QHS to ears prn itch (Patient taking differently: Apply 1 application topically daily as needed (itching). Apply QHS to ears prn itch) 45 g 0  . predniSONE (DELTASONE) 10 MG tablet Take 4 qd for 3 days, then 3 qd for 3 days (Patient not taking: Reported on 08/20/2019) 21 tablet  0    Results for orders placed or performed during the hospital encounter of 08/22/19 (from the past 48 hour(s))  CBC     Status: None   Collection Time: 08/22/19  7:06 AM  Result Value Ref Range   WBC 6.1 4.0 - 10.5 K/uL   RBC 4.62 4.22 - 5.81 MIL/uL   Hemoglobin 15.4 13.0 - 17.0 g/dL   HCT 45.4 39.0 - 52.0 %   MCV 98.3 80.0 - 100.0 fL   MCH 33.3 26.0 - 34.0 pg   MCHC 33.9 30.0 - 36.0 g/dL   RDW 12.5 11.5 - 15.5 %   Platelets 189 150 - 400 K/uL   nRBC 0.0 0.0 - 0.2 %    Comment: Performed at [facility], [place] [address] [place], [place] [id number]  Basic metabolic panel     Status: Abnormal   Collection Time: 08/22/19  7:06 AM  Result Value Ref Range   Sodium 141 135 - 145 mmol/L   Potassium 4.1 3.5 - 5.1 mmol/L   Chloride 106 98 - 111 mmol/L   CO2 24 22 - 32 mmol/L   Glucose, Bld 108 (H) 70 - 99 mg/dL   BUN 15 8 - 23 mg/dL   Creatinine, Ser 0.85 0.61 - 1.24 mg/dL   Calcium 9.3 8.9 - 10.3 mg/dL   GFR calc non Af Amer >60 >60 mL/min   GFR calc Af Amer >60 >60 mL/min   Anion gap 11 5 - 15    Comment: Performed at [facility], [place] [address] [place], [place] [address]   No results found.  Review of Systems  All other systems reviewed and are negative.   Blood pressure (!) 178/74, pulse 70, temperature 97.9 F (36.6 C), temperature source Oral, resp. rate 18, height 5\' 8"  (1.727 m), weight 88.5 kg, SpO2 94 %. Physical Exam  Constitutional: He is oriented to person, place, and time. He appears well-developed and well-nourished. No distress.  HENT:  Head: Normocephalic and atraumatic.  Right Ear: External ear normal.  Left Ear: External ear normal.  Nose: Nose normal.  Mouth/Throat: Oropharynx is clear and moist.  Eyes: Pupils are equal, round, and reactive to light. Conjunctivae and EOM are normal.  Neck: Normal range of motion. Neck supple.  Cardiovascular: Normal rate.  Respiratory: Effort normal.  Neurological: He is alert and oriented to person,  place, and time. No cranial nerve deficit.  Skin: Skin is warm and dry.  Psychiatric: He has a normal mood and affect. His behavior is normal. Judgment and thought content normal.     Assessment/Plan Chronic sinusitis and turbinate hypertrophy  To OR for revision endoscopic sinus surgery and turbinate reduction.  Melida Quitter,  MD 08/22/2019, 8:58 AM

## 2019-08-22 NOTE — Anesthesia Postprocedure Evaluation (Signed)
Anesthesia Post Note  Patient: JOSEMARIA TOLMAN  Procedure(s) Performed: Sinus Endoscopy   Nasal Septoplasty with Bilateral TURBINATE REDUCTION, Bilateral Total Ethmoidectomy and Sphenoidotomy (Bilateral )     Patient location during evaluation: PACU Anesthesia Type: General Level of consciousness: awake and alert Pain management: pain level controlled Vital Signs Assessment: post-procedure vital signs reviewed and stable Respiratory status: spontaneous breathing, nonlabored ventilation, respiratory function stable and patient connected to nasal cannula oxygen Cardiovascular status: blood pressure returned to baseline and stable Postop Assessment: no apparent nausea or vomiting Anesthetic complications: no    Last Vitals:  Vitals:   08/22/19 1130 08/22/19 1148  BP: (!) 145/60 (!) 147/65  Pulse:  85  Resp:  20  Temp:    SpO2: 90% 95%    Last Pain:  Vitals:   08/22/19 1128  TempSrc:   PainSc: 0-No pain                 Miqueas Whilden COKER

## 2019-08-23 ENCOUNTER — Encounter (HOSPITAL_COMMUNITY): Payer: Self-pay | Admitting: Otolaryngology

## 2019-08-23 NOTE — Op Note (Signed)
NAME: Cameron Smith, Cameron Smith MEDICAL RECORD [id number] ACCOUNT [id number] DATE OF BIRTH:[birth year] FACILITY: [facility] LOCATION: [facility] PHYSICIAN:Kasarah Sitts Guido Sander, MD  OPERATIVE REPORT  DATE OF PROCEDURE:  08/22/2019  PREOPERATIVE DIAGNOSES: 1.  Chronic sinusitis. 2.  Inferior turbinate hypertrophy. 3.  Nasal septal deviation.  POSTOPERATIVE DIAGNOSES: 1.  Chronic sinusitis. 2.  Inferior turbinate hypertrophy 3.  Nasal septal deviation.  PROCEDURE: 1.  Bilateral total ethmoidectomy. 2.  Bilateral sphenoidotomy. 3.  Bilateral inferior turbinate reduction. 4.  Nasal septoplasty.  SURGEON:  Melida Quitter, MD  ANESTHESIA:  General endotracheal anesthesia.  COMPLICATIONS:  None.  INDICATIONS:  The patient is a 79 year old male who underwent sinus surgery over 10 years ago with some good results.  In recent months, he has been having ongoing postnasal drainage and productive cough as well as nasal obstruction, particularly at  night.  This has not responded to medical therapy, and he presented to the operating room for surgical management.  FINDINGS:  Previous sinus surgery was evident with widely patent maxillary antrostomies, evidence of previous ethmoidectomies, and visualized frontal recesses and right sphenoidotomy.  There was edematous tissue in the ethmoids, and a few additional  septations were able to be removed to complete the ethmoidectomy on both sides.  The right sphenoid opening was widened laterally.  The left sphenoid opening was identified and widened superiorly and laterally.  The frontal recesses and maxillary sinuses  were not disturbed.  Inferior turbinates were found to be enlarged and reduced.  The nasal septum had a large left-sided spur that was removed, and there was evidence of previous septoplasty with some cartilage missing.  DESCRIPTION OF PROCEDURE:  The patient was identified in the holding room, informed consent having been obtained including discussion of  risks, benefits, and alternatives.  The patient was brought to the operative suite and put on the operative table in  supine position.  Anesthesia was induced and the patient was intubated by the anesthesia team without difficulty.  The patient was given intravenous antibiotics during the case.  The eyes were lubricated, and the midface was prepped and draped in a  sterile fashion.  The eyes were taped closed with Steri-Strips.  Afrin pledgets were placed in both sides of the nose for several minutes.  The right-sided pledgets were removed, and the lateral nasal wall was injected with 1% lidocaine with 1:100,000  epinephrine.  Using the telescope, the sinuses were then inspected.  This was also then done on the left side with injection as well.  Ethmoid septations and tissues were then removed with the microdebrider, starting on the right and then proceeding to  the left side with maintenance of the middle turbinates in their original position and following the skull base and lamina papyracea as the other boundaries.  Also on both sides, the sphenoid openings were widened with a microdebrider superiorly and  laterally with the right side being well visualized and the left side requiring identification using the suction tip.  Frontal recesses and maxillary openings were left undisturbed.  Afrin pledgets were then packed into both ethmoid cavities.  The  inferior turbinates were then injected on both sides with local anesthetic.  Stab incisions were made at the anterior head of each inferior turbinate, and soft tissues were elevated off the underlying bone.  The turbinate blade on the microdebrider was  then used to remove the submucosal tissues, keeping the overlying mucosa intact.  The soft tissues were then redraped and the turbinates lateralized.  The septum was then injected  on both sides using the same local anesthetic.  A left-sided Killian  incision was made and subperichondrial flap elevated down the  left side.  Some of the previous septoplasty was evident by some of the quadrangular cartilage being absent inferiorly.  The large spur inferiorly was then isolated and soft tissue elevated  and was partially cartilage and partially bone.  The cartilage was removed, and the bone was taken down with an osteotome.  The posterior septal bone was also removed in piecemeal fashion.  This resulted in a median septum.  At this point, the nasal  passages were suctioned.  Pledgets were removed.  Half of the Nasopore pack coated in Bactroban ointment was placed in each ethmoid cavity and saturated with saline.  Doyle splints coated in Bactroban ointment were placed in both nasal passages.  The  Killian incision was closed with 4-0 chromic suture in a simple interrupted fashion.  The stents were brought into normal position and secured with a single 2-0 chromic mattress suture.  The throat was then suctioned and drapes were removed.  He was  cleaned off and returned to anesthesia for wakeup, was extubated, and taken to the recovery room in stable condition.  LN/NUANCE  D:08/22/2019 T:08/23/2019 JOB:[id number]

## 2019-08-28 DIAGNOSIS — J342 Deviated nasal septum: Secondary | ICD-10-CM | POA: Insufficient documentation

## 2019-09-01 DIAGNOSIS — Z23 Encounter for immunization: Secondary | ICD-10-CM | POA: Diagnosis not present

## 2019-09-05 DIAGNOSIS — Z87891 Personal history of nicotine dependence: Secondary | ICD-10-CM | POA: Diagnosis not present

## 2019-09-05 DIAGNOSIS — J324 Chronic pansinusitis: Secondary | ICD-10-CM | POA: Diagnosis not present

## 2019-09-05 DIAGNOSIS — J343 Hypertrophy of nasal turbinates: Secondary | ICD-10-CM | POA: Diagnosis not present

## 2019-10-06 ENCOUNTER — Telehealth: Payer: Self-pay | Admitting: Family Medicine

## 2019-10-06 DIAGNOSIS — E039 Hypothyroidism, unspecified: Secondary | ICD-10-CM

## 2019-10-06 DIAGNOSIS — E119 Type 2 diabetes mellitus without complications: Secondary | ICD-10-CM

## 2019-10-06 DIAGNOSIS — Z125 Encounter for screening for malignant neoplasm of prostate: Secondary | ICD-10-CM

## 2019-10-06 DIAGNOSIS — Z79899 Other long term (current) drug therapy: Secondary | ICD-10-CM

## 2019-10-06 DIAGNOSIS — I10 Essential (primary) hypertension: Secondary | ICD-10-CM

## 2019-10-06 DIAGNOSIS — E785 Hyperlipidemia, unspecified: Secondary | ICD-10-CM

## 2019-10-06 NOTE — Telephone Encounter (Signed)
All the last yr plus psa(to experts rec no psa at this age)

## 2019-10-06 NOTE — Telephone Encounter (Signed)
Blood work ordered in Epic. Patient notified. 

## 2019-10-06 NOTE — Telephone Encounter (Signed)
Patient has 6 month follow up on 11/30 and would like his labs done also he wants a PSA ordered on his labs work

## 2019-10-06 NOTE — Telephone Encounter (Signed)
Last labs 4/20: Lipid, liver, Met 7, TSH, HgbA1c

## 2019-10-09 ENCOUNTER — Emergency Department (HOSPITAL_COMMUNITY)
Admission: EM | Admit: 2019-10-09 | Discharge: 2019-10-09 | Disposition: A | Discharge status: Home / Self Care | Payer: Medicare Other | Attending: Emergency Medicine | Admitting: Emergency Medicine

## 2019-10-09 ENCOUNTER — Emergency Department (HOSPITAL_COMMUNITY): Payer: Medicare Other

## 2019-10-09 ENCOUNTER — Encounter (HOSPITAL_COMMUNITY): Payer: Self-pay

## 2019-10-09 ENCOUNTER — Other Ambulatory Visit: Payer: Self-pay

## 2019-10-09 DIAGNOSIS — R1032 Left lower quadrant pain: Secondary | ICD-10-CM | POA: Diagnosis not present

## 2019-10-09 DIAGNOSIS — E039 Hypothyroidism, unspecified: Secondary | ICD-10-CM | POA: Insufficient documentation

## 2019-10-09 DIAGNOSIS — Z87891 Personal history of nicotine dependence: Secondary | ICD-10-CM | POA: Insufficient documentation

## 2019-10-09 DIAGNOSIS — Z79899 Other long term (current) drug therapy: Secondary | ICD-10-CM | POA: Diagnosis not present

## 2019-10-09 DIAGNOSIS — I10 Essential (primary) hypertension: Secondary | ICD-10-CM | POA: Diagnosis not present

## 2019-10-09 DIAGNOSIS — R1030 Lower abdominal pain, unspecified: Secondary | ICD-10-CM

## 2019-10-09 DIAGNOSIS — R109 Unspecified abdominal pain: Secondary | ICD-10-CM | POA: Diagnosis not present

## 2019-10-09 DIAGNOSIS — Z7982 Long term (current) use of aspirin: Secondary | ICD-10-CM | POA: Diagnosis not present

## 2019-10-09 HISTORY — DX: Diverticulitis of intestine, part unspecified, without perforation or abscess without bleeding: K57.92

## 2019-10-09 LAB — URINALYSIS, ROUTINE W REFLEX MICROSCOPIC
Bacteria, UA: NONE SEEN
Bilirubin Urine: NEGATIVE
Glucose, UA: NEGATIVE mg/dL
Ketones, ur: 5 mg/dL — AB
Leukocytes,Ua: NEGATIVE
Nitrite: NEGATIVE
Protein, ur: 100 mg/dL — AB
Specific Gravity, Urine: 1.024 (ref 1.005–1.030)
pH: 5 (ref 5.0–8.0)

## 2019-10-09 LAB — CBC
HCT: 46.1 % (ref 39.0–52.0)
Hemoglobin: 15.2 g/dL (ref 13.0–17.0)
MCH: 32 pg (ref 26.0–34.0)
MCHC: 33 g/dL (ref 30.0–36.0)
MCV: 97.1 fL (ref 80.0–100.0)
Platelets: 153 10*3/uL (ref 150–400)
RBC: 4.75 MIL/uL (ref 4.22–5.81)
RDW: 12.4 % (ref 11.5–15.5)
WBC: 6.2 10*3/uL (ref 4.0–10.5)
nRBC: 0 % (ref 0.0–0.2)

## 2019-10-09 LAB — COMPREHENSIVE METABOLIC PANEL
ALT: 90 U/L — ABNORMAL HIGH (ref 0–44)
AST: 36 U/L (ref 15–41)
Albumin: 3.7 g/dL (ref 3.5–5.0)
Alkaline Phosphatase: 127 U/L — ABNORMAL HIGH (ref 38–126)
Anion gap: 10 (ref 5–15)
BUN: 19 mg/dL (ref 8–23)
CO2: 22 mmol/L (ref 22–32)
Calcium: 8.7 mg/dL — ABNORMAL LOW (ref 8.9–10.3)
Chloride: 103 mmol/L (ref 98–111)
Creatinine, Ser: 0.94 mg/dL (ref 0.61–1.24)
GFR calc Af Amer: >60 mL/min (ref >60)
GFR calc non Af Amer: >60 mL/min (ref >60)
Glucose, Bld: 127 mg/dL — ABNORMAL HIGH (ref 70–99)
Potassium: 4 mmol/L (ref 3.5–5.1)
Sodium: 135 mmol/L (ref 135–145)
Total Bilirubin: 1.8 mg/dL — ABNORMAL HIGH (ref 0.3–1.2)
Total Protein: 7.3 g/dL (ref 6.5–8.1)

## 2019-10-09 LAB — LIPASE, BLOOD: Lipase: 22 U/L (ref 11–51)

## 2019-10-09 MED ORDER — HYDROCODONE-ACETAMINOPHEN 5-325 MG PO TABS: 1.0000 | ORAL_TABLET | Freq: Four times a day (QID) | ORAL | 0 refills | Status: DC | PRN

## 2019-10-09 MED ORDER — CIPROFLOXACIN HCL 500 MG PO TABS: 500.0000 mg | ORAL_TABLET | Freq: Two times a day (BID) | ORAL | 0 refills | Status: DC

## 2019-10-09 MED ORDER — SODIUM CHLORIDE 0.9% FLUSH: 3.0000 mL | Freq: Once | INTRAVENOUS | Status: DC

## 2019-10-09 MED ORDER — SODIUM CHLORIDE 0.9 % IV BOLUS
500.0000 mL | Freq: Once | INTRAVENOUS | Status: AC
  Administered 2019-10-09: 500 mL via INTRAVENOUS

## 2019-10-09 MED ORDER — IOHEXOL 300 MG/ML  SOLN
100.0000 mL | Freq: Once | INTRAMUSCULAR | Status: AC | PRN
  Administered 2019-10-09: 100 mL via INTRAVENOUS

## 2019-10-09 MED ORDER — ONDANSETRON HCL 4 MG/2ML IJ SOLN
4.0000 mg | Freq: Once | INTRAMUSCULAR | Status: AC
  Administered 2019-10-09: 4 mg via INTRAVENOUS
  Filled 2019-10-09: qty 2

## 2019-10-09 NOTE — ED Provider Notes (Signed)
[facility] EMERGENCY DEPARTMENT Provider Note   CSN: [id number] Arrival date & time: 10/09/19  [id number]     History   Chief Complaint Chief Complaint  Patient presents with  . Abdominal Pain    HPI COOLIDGE COFFING is a 78 y.o. male.     Patient complains of abdominal pain similar to when he had diverticulitis  The history is provided by the patient. No language interpreter was used.  Abdominal Pain Pain location:  Generalized Pain quality: aching   Pain radiates to:  Does not radiate Pain severity:  Moderate Onset quality:  Sudden Associated symptoms: no chest pain, no cough, no diarrhea, no fatigue and no hematuria     Past Medical History:  Diagnosis Date  . Acute inflammation of nasal sinus    Chronic  . Anxiety   . Diverticulitis   . Hypercholesteremia   . Hypertension   . Hypothyroidism   . Nasal turbinate hypertrophy   . Reactive airways dysfunction syndrome [facility])     Patient Active Problem List   Diagnosis Date Noted  . Anosmia 04/17/2017  . History of subdural hematoma (post traumatic) 04/17/2017  . Essential hypertension, benign 07/08/2013  . Hyperlipidemia LDL goal <130 07/08/2013  . Impaired fasting glucose 07/08/2013  . Hypothyroidism 07/08/2013  . HIP PAIN 11/08/2009  . [place] DISEASE, LUMBOSACRAL SPINE 11/08/2009  . SCIATICA 11/08/2009  . BACK PAIN 11/08/2009    Past Surgical History:  Procedure Laterality Date  . BACK SURGERY     sciatic  . COLONOSCOPY  10/31/2011   Procedure: COLONOSCOPY;  Surgeon: Daneil Dolin, MD;  Location: [facility];  Service: Endoscopy;  Laterality: N/A;  10:30 AM  . NASAL SEPTOPLASTY W/ TURBINOPLASTY Bilateral 08/22/2019   Procedure: Sinus Endoscopy   Nasal Septoplasty with Bilateral TURBINATE REDUCTION, Bilateral Total Ethmoidectomy and Sphenoidotomy;  Surgeon: Melida Quitter, MD;  Location: [place];  Service: ENT;  Laterality: Bilateral;  . NASAL SINUS SURGERY          Home Medications     Prior to Admission medications   Medication Sig Start Date End Date Taking? Authorizing Provider  amLODipine (NORVASC) 2.5 MG tablet TAKE 1 TABLET BY MOUTH IN  THE MORNING Patient taking differently: Take 2.5 mg by mouth daily.  07/15/19  Yes Mikey Kirschner, MD  aspirin EC 81 MG tablet Take 81 mg by mouth every evening.   Yes [provider]  atorvastatin (LIPITOR) 40 MG tablet TAKE 1 TABLET BY MOUTH  DAILY Patient taking differently: Take 40 mg by mouth every evening.  07/15/19  Yes Mikey Kirschner, MD  Coenzyme Q10 ([employer] COQ-10) 200 MG capsule Take 200 mg by mouth every evening.   Yes [provider]  fluticasone (FLOVENT HFA) 44 MCG/ACT inhaler Inhale 2 puffs into the lungs 2 (two) times daily. Patient taking differently: Inhale 2 puffs into the lungs 2 (two) times daily as needed.  08/20/19  Yes Mikey Kirschner, MD  levothyroxine (SYNTHROID) 88 MCG tablet TAKE 1 TABLET BY MOUTH  EVERY MORNING ON AN EMPTY  STOMACH Patient taking differently: Take 88 mcg by mouth daily before breakfast.  03/19/19  Yes Mikey Kirschner, MD  lisinopril (ZESTRIL) 40 MG tablet TAKE 1 TABLET BY MOUTH  DAILY Patient taking differently: Take 40 mg by mouth every evening.  07/15/19  Yes Mikey Kirschner, MD  meclizine (ANTIVERT) 25 MG tablet Take 1 tablet (25 mg total) by mouth 3 (three) times daily as needed for dizziness.  11/19/18  Yes Mikey Kirschner, MD  mometasone (ELOCON) 0.1 % cream Apply QHS to ears prn itch Patient taking differently: Apply 1 application topically daily as needed (itching). Apply QHS to ears prn itch 08/23/18  Yes Mikey Kirschner, MD  vitamin E 400 UNIT capsule Take 400 Units by mouth every evening.    Yes [provider]  ciprofloxacin (CIPRO) 500 MG tablet Take 1 tablet (500 mg total) by mouth 2 (two) times daily. One po bid x 7 days 10/09/19   Milton Ferguson, MD  HYDROcodone-acetaminophen (NORCO/VICODIN) 5-325 MG tablet Take 1 tablet by mouth every 6 (six)  hours as needed. 10/09/19   Milton Ferguson, MD    Family History Family History  Problem Relation Age of Onset  . Heart disease Father   . Diabetes Sister   . Colon cancer Neg Hx     Social History Social History   Tobacco Use  . Smoking status: Former Smoker    Packs/day: 3.00    Years: 16.00    Pack years: 48.00  . Smokeless tobacco: Never Used  Substance Use Topics  . Alcohol use: No  . Drug use: No     Allergies   Flomax [tamsulosin hcl]   Review of Systems Review of Systems  Constitutional: Negative for appetite change and fatigue.  HENT: Negative for congestion, ear discharge and sinus pressure.   Eyes: Negative for discharge.  Respiratory: Negative for cough.   Cardiovascular: Negative for chest pain.  Gastrointestinal: Positive for abdominal pain. Negative for diarrhea.  Genitourinary: Negative for frequency and hematuria.  Musculoskeletal: Negative for back pain.  Skin: Negative for rash.  Neurological: Negative for seizures and headaches.  Psychiatric/Behavioral: Negative for hallucinations.     Physical Exam Updated Vital Signs BP (!) 169/86 (BP Location: Left Arm)   Pulse 97   Temp 100.1 F (37.8 C) (Oral)   Resp 20   Ht 5\' 8"  (1.727 m)   Wt 88.5 kg   SpO2 94%   BMI 29.65 kg/m   Physical Exam Vitals signs and nursing note reviewed.  Constitutional:      Appearance: He is well-developed.  HENT:     Head: Normocephalic.     Nose: Nose normal.  Eyes:     General: No scleral icterus.    Conjunctiva/sclera: Conjunctivae normal.  Neck:     Musculoskeletal: Neck supple.     Thyroid: No thyromegaly.  Cardiovascular:     Rate and Rhythm: Normal rate and regular rhythm.     Heart sounds: No murmur. No friction rub. No gallop.   Pulmonary:     Breath sounds: No stridor. No wheezing or rales.  Chest:     Chest wall: No tenderness.  Abdominal:     General: There is no distension.     Tenderness: There is abdominal tenderness. There is no  rebound.  Musculoskeletal: Normal range of motion.  Lymphadenopathy:     Cervical: No cervical adenopathy.  Skin:    Findings: No erythema or rash.  Neurological:     Mental Status: He is oriented to person, place, and time.     Motor: No abnormal muscle tone.     Coordination: Coordination normal.  Psychiatric:        Behavior: Behavior normal.      ED Treatments / Results  Labs (all labs ordered are listed, but only abnormal results are displayed) Labs Reviewed  COMPREHENSIVE METABOLIC PANEL - Abnormal; Notable for the following components:  Result Value   Glucose, Bld 127 (*)    Calcium 8.7 (*)    ALT 90 (*)    Alkaline Phosphatase 127 (*)    Total Bilirubin 1.8 (*)    All other components within normal limits  URINALYSIS, ROUTINE W REFLEX MICROSCOPIC - Abnormal; Notable for the following components:   Color, Urine AMBER (*)    APPearance HAZY (*)    Hgb urine dipstick SMALL (*)    Ketones, ur 5 (*)    Protein, ur 100 (*)    All other components within normal limits  LIPASE, BLOOD  CBC    EKG None  Radiology Ct Abdomen Pelvis W Contrast  Result Date: 10/09/2019 CLINICAL DATA:  Abdominal distension, lower abdominal pain. EXAM: CT ABDOMEN AND PELVIS WITH CONTRAST TECHNIQUE: Multidetector CT imaging of the abdomen and pelvis was performed using the standard protocol following bolus administration of intravenous contrast. CONTRAST:  1108mL OMNIPAQUE IOHEXOL 300 MG/ML  SOLN COMPARISON:  11/22/2013 FINDINGS: Lower chest: Chronic elevation of the right hemidiaphragm with juxta diaphragmatic atelectasis is similar to the prior study. No signs of consolidation or pleural effusion. Thoracic aortic atherosclerotic changes. No signs of pericardial effusion. Hepatobiliary: No signs of focal, suspicious hepatic lesion. Patent portal vein. Gallbladder is normal. No signs of biliary ductal dilation. Pancreas: Unremarkable. No pancreatic ductal dilatation or surrounding  inflammatory changes. Spleen: Normal in size without focal abnormality. Adrenals/Urinary Tract: Adrenal glands are unremarkable. Kidneys are normal, without renal calculi, focal lesion, or hydronephrosis. Bladder is unremarkable. Stomach/Bowel: No signs of acute bowel process. Signs of colonic diverticulosis. Normal appendix. Vascular/Lymphatic: Calcific and noncalcific atherosclerosis throughout the abdominal aorta. No signs of aneurysm. Patent SMV. No signs of retroperitoneal adenopathy or upper abdominal adenopathy. No signs of pelvic lymphadenopathy. Reproductive: Calcified changes in the prostate gland. Urinary bladder is unremarkable. Other: Small fat containing inguinal hernias. No significant abdominal wall hernia. Musculoskeletal: No signs of acute bone finding or destructive bone process. Signs of spinal degenerative change. IMPRESSION: 1. No acute process in the abdomen or pelvis. 2. Chronic elevation of the right hemidiaphragm with juxta diaphragmatic atelectasis. 3. Atherosclerotic changes in the abdominal aorta. Aortic Atherosclerosis (ICD10-I70.0). Electronically Signed   By: Zetta Bills M.D.   On: 10/09/2019 09:36    Procedures Procedures (including critical care time)  Medications Ordered in ED Medications  sodium chloride flush (NS) 0.9 % injection 3 mL (has no administration in time range)  sodium chloride 0.9 % bolus 500 mL (0 mLs Intravenous Stopped 10/09/19 0938)  ondansetron (ZOFRAN) injection 4 mg (4 mg Intravenous Given 10/09/19 0811)  iohexol (OMNIPAQUE) 300 MG/ML solution 100 mL (100 mLs Intravenous Contrast Given 10/09/19 0905)     Initial Impression / Assessment and Plan / ED Course  I have reviewed the triage vital signs and the nursing notes.  Pertinent labs & imaging results that were available during my care of the patient were reviewed by me and considered in my medical decision making (see chart for details).    Patient with left lower quadrant abdominal  pain.  History of diverticulitis.  Patient also has elevated liver enzymes.  CT scan unremarkable but we will empirically treat him with Cipro and have him follow-up with his primary care doctor      Final Clinical Impressions(s) / ED Diagnoses   Final diagnoses:  Lower abdominal pain    ED Discharge Orders         Ordered    ciprofloxacin (CIPRO) 500 MG tablet  2 times  daily     10/09/19 1205    HYDROcodone-acetaminophen (NORCO/VICODIN) 5-325 MG tablet  Every 6 hours PRN     10/09/19 1205           Milton Ferguson, MD 10/09/19 1207

## 2019-10-09 NOTE — ED Triage Notes (Signed)
Pt reports lower abdominal pain for several days. Has hx of diverticulitis. Pt states he has had fever and chills

## 2019-10-09 NOTE — Discharge Instructions (Addendum)
Follow-up with your family doctor next week for recheck.  Your liver enzymes were slightly elevated and your doctor needs to follow that

## 2019-10-10 DIAGNOSIS — E039 Hypothyroidism, unspecified: Secondary | ICD-10-CM | POA: Diagnosis not present

## 2019-10-10 DIAGNOSIS — E785 Hyperlipidemia, unspecified: Secondary | ICD-10-CM | POA: Diagnosis not present

## 2019-10-10 DIAGNOSIS — I10 Essential (primary) hypertension: Secondary | ICD-10-CM | POA: Diagnosis not present

## 2019-10-10 DIAGNOSIS — Z125 Encounter for screening for malignant neoplasm of prostate: Secondary | ICD-10-CM | POA: Diagnosis not present

## 2019-10-10 DIAGNOSIS — E119 Type 2 diabetes mellitus without complications: Secondary | ICD-10-CM | POA: Diagnosis not present

## 2019-10-11 LAB — LIPID PANEL
Chol/HDL Ratio: 5.7 ratio — ABNORMAL HIGH (ref 0.0–5.0)
Cholesterol, Total: 172 mg/dL (ref 100–199)
HDL: 30 mg/dL — ABNORMAL LOW (ref >39)
LDL Chol Calc (NIH): 107 mg/dL — ABNORMAL HIGH (ref 0–99)
Triglycerides: 200 mg/dL — ABNORMAL HIGH (ref 0–149)
VLDL Cholesterol Cal: 35 mg/dL (ref 5–40)

## 2019-10-11 LAB — HEPATIC FUNCTION PANEL
ALT: 67 IU/L — ABNORMAL HIGH (ref 0–44)
AST: 28 IU/L (ref 0–40)
Albumin: 4 g/dL (ref 3.7–4.7)
Alkaline Phosphatase: 135 IU/L — ABNORMAL HIGH (ref 39–117)
Bilirubin Total: 1 mg/dL (ref 0.0–1.2)
Bilirubin, Direct: 0.53 mg/dL — ABNORMAL HIGH (ref 0.00–0.40)
Total Protein: 6.2 g/dL (ref 6.0–8.5)

## 2019-10-11 LAB — BASIC METABOLIC PANEL
BUN/Creatinine Ratio: 16 (ref 10–24)
BUN: 18 mg/dL (ref 8–27)
CO2: 25 mmol/L (ref 20–29)
Calcium: 8.9 mg/dL (ref 8.6–10.2)
Chloride: 99 mmol/L (ref 96–106)
Creatinine, Ser: 1.11 mg/dL (ref 0.76–1.27)
GFR calc Af Amer: 74 mL/min/{1.73_m2} (ref >59)
GFR calc non Af Amer: 64 mL/min/{1.73_m2} (ref >59)
Glucose: 103 mg/dL — ABNORMAL HIGH (ref 65–99)
Potassium: 4.8 mmol/L (ref 3.5–5.2)
Sodium: 138 mmol/L (ref 134–144)

## 2019-10-11 LAB — HEMOGLOBIN A1C
Est. average glucose Bld gHb Est-mCnc: 137 mg/dL
Hgb A1c MFr Bld: 6.4 % — ABNORMAL HIGH (ref 4.8–5.6)

## 2019-10-11 LAB — PSA: Prostate Specific Ag, Serum: 0.4 ng/mL (ref 0.0–4.0)

## 2019-10-11 LAB — TSH: TSH: 6.95 u[IU]/mL — ABNORMAL HIGH (ref 0.450–4.500)

## 2019-10-20 ENCOUNTER — Other Ambulatory Visit: Payer: Self-pay

## 2019-10-20 ENCOUNTER — Ambulatory Visit (INDEPENDENT_AMBULATORY_CARE_PROVIDER_SITE_OTHER): Payer: Medicare Other | Admitting: Family Medicine

## 2019-10-20 ENCOUNTER — Ambulatory Visit: Payer: Medicare Other | Admitting: Family Medicine

## 2019-10-20 ENCOUNTER — Encounter: Payer: Self-pay | Admitting: Family Medicine

## 2019-10-20 VITALS — BP 122/76 | Temp 97.2°F | Ht 67.0 in | Wt 193.2 lb

## 2019-10-20 DIAGNOSIS — I10 Essential (primary) hypertension: Secondary | ICD-10-CM | POA: Diagnosis not present

## 2019-10-20 DIAGNOSIS — E039 Hypothyroidism, unspecified: Secondary | ICD-10-CM

## 2019-10-20 DIAGNOSIS — J4541 Moderate persistent asthma with (acute) exacerbation: Secondary | ICD-10-CM

## 2019-10-20 DIAGNOSIS — E785 Hyperlipidemia, unspecified: Secondary | ICD-10-CM | POA: Diagnosis not present

## 2019-10-20 DIAGNOSIS — E119 Type 2 diabetes mellitus without complications: Secondary | ICD-10-CM | POA: Diagnosis not present

## 2019-10-20 MED ORDER — HYDROCODONE-HOMATROPINE 5-1.5 MG/5ML PO SYRP: 5.0000 mL | ORAL_SOLUTION | Freq: Every evening | ORAL | 0 refills | Status: DC | PRN

## 2019-10-20 MED ORDER — LEVOTHYROXINE SODIUM 100 MCG PO TABS: 100.0000 ug | ORAL_TABLET | Freq: Every day | ORAL | 3 refills | Status: DC

## 2019-10-20 MED ORDER — ATORVASTATIN CALCIUM 40 MG PO TABS: 40.0000 mg | ORAL_TABLET | Freq: Every day | ORAL | 1 refills | Status: DC

## 2019-10-20 MED ORDER — AMLODIPINE BESYLATE 2.5 MG PO TABS: 2.5000 mg | ORAL_TABLET | Freq: Every morning | ORAL | 1 refills | Status: DC

## 2019-10-20 MED ORDER — LISINOPRIL 40 MG PO TABS: 40.0000 mg | ORAL_TABLET | Freq: Every day | ORAL | 1 refills | Status: DC

## 2019-10-20 MED ORDER — LEVOTHYROXINE SODIUM 88 MCG PO TABS: ORAL_TABLET | ORAL | 1 refills | Status: DC

## 2019-10-20 NOTE — Progress Notes (Signed)
Subjective:    Patient ID: Cameron Smith, male    DOB: Jun 23, 1941, 78 y.o.   MRN: [id number]  Hypertension This is a chronic problem. The current episode started more than 1 year ago. Risk factors for coronary artery disease include dyslipidemia. Treatments tried: norvasc, lisinopril.   Review of Systems  Results for orders placed or performed during the hospital encounter of 10/09/19  Lipase, blood  Result Value Ref Range   Lipase 22 11 - 51 U/L  Comprehensive metabolic panel  Result Value Ref Range   Sodium 135 135 - 145 mmol/L   Potassium 4.0 3.5 - 5.1 mmol/L   Chloride 103 98 - 111 mmol/L   CO2 22 22 - 32 mmol/L   Glucose, Bld 127 (H) 70 - 99 mg/dL   BUN 19 8 - 23 mg/dL   Creatinine, Ser 0.94 0.61 - 1.24 mg/dL   Calcium 8.7 (L) 8.9 - 10.3 mg/dL   Total Protein 7.3 6.5 - 8.1 g/dL   Albumin 3.7 3.5 - 5.0 g/dL   AST 36 15 - 41 U/L   ALT 90 (H) 0 - 44 U/L   Alkaline Phosphatase 127 (H) 38 - 126 U/L   Total Bilirubin 1.8 (H) 0.3 - 1.2 mg/dL   GFR calc non Af Amer >60 >60 mL/min   GFR calc Af Amer >60 >60 mL/min   Anion gap 10 5 - 15  CBC  Result Value Ref Range   WBC 6.2 4.0 - 10.5 K/uL   RBC 4.75 4.22 - 5.81 MIL/uL   Hemoglobin 15.2 13.0 - 17.0 g/dL   HCT 46.1 39.0 - 52.0 %   MCV 97.1 80.0 - 100.0 fL   MCH 32.0 26.0 - 34.0 pg   MCHC 33.0 30.0 - 36.0 g/dL   RDW 12.4 11.5 - 15.5 %   Platelets 153 150 - 400 K/uL   nRBC 0.0 0.0 - 0.2 %  Urinalysis, Routine w reflex microscopic  Result Value Ref Range   Color, Urine AMBER (A) YELLOW   APPearance HAZY (A) CLEAR   Specific Gravity, Urine 1.024 1.005 - 1.030   pH 5.0 5.0 - 8.0   Glucose, UA NEGATIVE NEGATIVE mg/dL   Hgb urine dipstick SMALL (A) NEGATIVE   Bilirubin Urine NEGATIVE NEGATIVE   Ketones, ur 5 (A) NEGATIVE mg/dL   Protein, ur 100 (A) NEGATIVE mg/dL   Nitrite NEGATIVE NEGATIVE   Leukocytes,Ua NEGATIVE NEGATIVE   RBC / HPF 0-5 0 - 5 RBC/hpf   WBC, UA 0-5 0 - 5 WBC/hpf   Bacteria, UA NONE SEEN NONE SEEN   Squamous Epithelial / LPF 0-5 0 - 5   Mucus PRESENT     Blood pressure medicine and blood pressure levels reviewed today with patient. Compliant with blood pressure medicine. States does not miss a dose. No obvious side effects. Blood pressure generally good when checked elsewhere. Watching salt intake.   Patient compliant with thyroid medication.  Does not miss a dose.  Some tiredness  Patient's chronic cough overall has improved.  He uses Flovent for just a few weeks.  Notes that it has improved now that he has had sinus surgery.  Recently in the emergency room.  ER report reviewed.  Was treated for presumptive diverticulitis no scan was normal.  Now asymptomatic   No headache, no major weight loss or weight gain, no chest pain no back pain abdominal pain no change in bowel habits complete ROS otherwise negative     Objective:  Physical Exam Alert and oriented, vitals reviewed and stable, NAD ENT-TM's and ext canals WNL bilat via otoscopic exam Soft palate, tonsils and post pharynx WNL via oropharyngeal exam Neck-symmetric, no masses; thyroid nonpalpable and nontender Pulmonary-no tachypnea or accessory muscle use; Clear without wheezes via auscultation Card--no abnrml murmurs, rhythm reg and rate WNL Carotid pulses symmetric, without bruits        Assessment & Plan:  Impression 1 hypertension good control discussed maintain same meds  2.  Hypothyroidism.  Somewhat subtherapeutic will increase levothyroxine  3.  Chronic cough now has pretty much disappeared patient would like to have Hycodan on hand for occasional nighttime flare will prescribe  4.  Recent ER visit for abdominal pain now resolved discussed follow-up encouraged  5.  Prediabetes A1c still 6.4 diet discussed  Follow-up in 6 months flu shot already given symptom care discussed blood work discussed

## 2019-10-21 ENCOUNTER — Other Ambulatory Visit: Payer: Self-pay | Admitting: Family Medicine

## 2019-10-21 MED ORDER — HYDROCODONE-HOMATROPINE 5-1.5 MG/5ML PO SYRP: 5.0000 mL | ORAL_SOLUTION | Freq: Every evening | ORAL | 0 refills | Status: DC | PRN

## 2019-10-21 NOTE — Telephone Encounter (Signed)
Patient said his cough meds was suppose to go to [facility] and not to [facility].  Can we resend it to [facility] and let him know so he won't go back to the pharmacy until it has been fixed.

## 2019-10-21 NOTE — Telephone Encounter (Signed)
Please advise. Thank you

## 2019-10-21 NOTE — Telephone Encounter (Signed)
Sure, send bk to me when ready to sign

## 2019-12-25 ENCOUNTER — Encounter: Payer: Self-pay | Admitting: Family Medicine

## 2020-02-13 ENCOUNTER — Telehealth: Payer: Self-pay | Admitting: Family Medicine

## 2020-02-13 DIAGNOSIS — E785 Hyperlipidemia, unspecified: Secondary | ICD-10-CM

## 2020-02-13 DIAGNOSIS — E119 Type 2 diabetes mellitus without complications: Secondary | ICD-10-CM

## 2020-02-13 DIAGNOSIS — E039 Hypothyroidism, unspecified: Secondary | ICD-10-CM

## 2020-02-13 DIAGNOSIS — I10 Essential (primary) hypertension: Secondary | ICD-10-CM

## 2020-02-13 DIAGNOSIS — Z79899 Other long term (current) drug therapy: Secondary | ICD-10-CM

## 2020-02-13 NOTE — Telephone Encounter (Signed)
Las ordered, patient notified.

## 2020-02-13 NOTE — Telephone Encounter (Signed)
Tsh lip liv A1c

## 2020-02-13 NOTE — Telephone Encounter (Signed)
Patient has 6 month on 5/25 and wanting labs done

## 2020-03-09 DIAGNOSIS — I10 Essential (primary) hypertension: Secondary | ICD-10-CM | POA: Diagnosis not present

## 2020-03-09 DIAGNOSIS — E119 Type 2 diabetes mellitus without complications: Secondary | ICD-10-CM | POA: Diagnosis not present

## 2020-03-09 DIAGNOSIS — E785 Hyperlipidemia, unspecified: Secondary | ICD-10-CM | POA: Diagnosis not present

## 2020-03-09 DIAGNOSIS — Z79899 Other long term (current) drug therapy: Secondary | ICD-10-CM | POA: Diagnosis not present

## 2020-03-09 DIAGNOSIS — E039 Hypothyroidism, unspecified: Secondary | ICD-10-CM | POA: Diagnosis not present

## 2020-03-10 LAB — HEPATIC FUNCTION PANEL
ALT: 22 IU/L (ref 0–44)
AST: 19 IU/L (ref 0–40)
Albumin: 4.6 g/dL (ref 3.7–4.7)
Alkaline Phosphatase: 68 IU/L (ref 39–117)
Bilirubin Total: 0.6 mg/dL (ref 0.0–1.2)
Bilirubin, Direct: 0.14 mg/dL (ref 0.00–0.40)
Total Protein: 6.8 g/dL (ref 6.0–8.5)

## 2020-03-10 LAB — LIPID PANEL
Chol/HDL Ratio: 4 ratio (ref 0.0–5.0)
Cholesterol, Total: 166 mg/dL (ref 100–199)
HDL: 42 mg/dL (ref >39)
LDL Chol Calc (NIH): 101 mg/dL — ABNORMAL HIGH (ref 0–99)
Triglycerides: 129 mg/dL (ref 0–149)
VLDL Cholesterol Cal: 23 mg/dL (ref 5–40)

## 2020-03-10 LAB — TSH: TSH: 1.8 u[IU]/mL (ref 0.450–4.500)

## 2020-03-10 LAB — HEMOGLOBIN A1C
Est. average glucose Bld gHb Est-mCnc: 131 mg/dL
Hgb A1c MFr Bld: 6.2 % — ABNORMAL HIGH (ref 4.8–5.6)

## 2020-04-13 ENCOUNTER — Other Ambulatory Visit: Payer: Self-pay

## 2020-04-13 ENCOUNTER — Ambulatory Visit (INDEPENDENT_AMBULATORY_CARE_PROVIDER_SITE_OTHER): Payer: Medicare Other | Admitting: Family Medicine

## 2020-04-13 VITALS — BP 134/82 | Temp 97.7°F | Wt 197.4 lb

## 2020-04-13 DIAGNOSIS — E785 Hyperlipidemia, unspecified: Secondary | ICD-10-CM

## 2020-04-13 DIAGNOSIS — I10 Essential (primary) hypertension: Secondary | ICD-10-CM | POA: Diagnosis not present

## 2020-04-13 DIAGNOSIS — E119 Type 2 diabetes mellitus without complications: Secondary | ICD-10-CM | POA: Diagnosis not present

## 2020-04-13 MED ORDER — MECLIZINE HCL 25 MG PO TABS: 25.0000 mg | ORAL_TABLET | Freq: Three times a day (TID) | ORAL | 4 refills | Status: DC | PRN

## 2020-04-13 MED ORDER — LEVOTHYROXINE SODIUM 100 MCG PO TABS: 100.0000 ug | ORAL_TABLET | Freq: Every day | ORAL | 1 refills | Status: DC

## 2020-04-13 MED ORDER — LISINOPRIL 40 MG PO TABS: 40.0000 mg | ORAL_TABLET | Freq: Every day | ORAL | 1 refills | Status: DC

## 2020-04-13 MED ORDER — ATORVASTATIN CALCIUM 40 MG PO TABS: 40.0000 mg | ORAL_TABLET | Freq: Every day | ORAL | 1 refills | Status: DC

## 2020-04-13 MED ORDER — AMLODIPINE BESYLATE 2.5 MG PO TABS: 2.5000 mg | ORAL_TABLET | Freq: Every morning | ORAL | 1 refills | Status: DC

## 2020-04-13 NOTE — Progress Notes (Signed)
   Subjective:    Patient ID: Cameron Smith, male    DOB: Aug 09, 1941, 79 y.o.   MRN: [id number]  Hypertension This is a chronic problem. Risk factors for coronary artery disease include diabetes mellitus and dyslipidemia. Treatments tried: Amlodipine, lisinopril. There are no compliance problems.   Diabetes He presents for his follow-up diabetic visit. He has type 2 diabetes mellitus. Risk factors for coronary artery disease include dyslipidemia.   Results for orders placed or performed in visit on 02/13/20  TSH  Result Value Ref Range   TSH 1.800 0.450 - 4.500 uIU/mL  Lipid panel  Result Value Ref Range   Cholesterol, Total 166 100 - 199 mg/dL   Triglycerides 129 0 - 149 mg/dL   HDL 42 >39 mg/dL   VLDL Cholesterol Cal 23 5 - 40 mg/dL   LDL Chol Calc (NIH) 101 (H) 0 - 99 mg/dL   Chol/HDL Ratio 4.0 0.0 - 5.0 ratio  Hepatic function panel  Result Value Ref Range   Total Protein 6.8 6.0 - 8.5 g/dL   Albumin 4.6 3.7 - 4.7 g/dL   Bilirubin Total 0.6 0.0 - 1.2 mg/dL   Bilirubin, Direct 0.14 0.00 - 0.40 mg/dL   Alkaline Phosphatase 68 39 - 117 IU/L   AST 19 0 - 40 IU/L   ALT 22 0 - 44 IU/L  Hemoglobin A1c  Result Value Ref Range   Hgb A1c MFr Bld 6.2 (H) 4.8 - 5.6 %   Est. average glucose Bld gHb Est-mCnc 131 mg/dL    Taking meds faithfully  Not walking much or exercising  Overall doing welll   bp usually runs better at night   118 over 76  covid vaccine Review of Systems No headache, no major weight loss or weight gain, no chest pain no back pain abdominal pain no change in bowel habits complete ROS otherwise negative     Objective:   Physical Exam Alert and oriented, vitals reviewed and stable, NAD ENT-TM's and ext canals WNL bilat via otoscopic exam Soft palate, tonsils and post pharynx WNL via oropharyngeal exam Neck-symmetric, no masses; thyroid nonpalpable and nontender Pulmonary-no tachypnea or accessory muscle use; Clear without wheezes via auscultation  Card--no abnrml murmurs, rhythm reg and rate WNL Carotid pulses symmetric, without bruits        Assessment & Plan:  Impression type 2 diabetes.  Patient on no medication.  Watches his diet.  Advised if the A1c does rise considerably will need to go on medication so to keep working hard on diet.  A1c excellent.  Diet discussed  2.  Hypertension.  Good control discussed blood pressure excellent on repeat  3.  Hyperlipidemia.  Cholesterol excellent discussed maintain same meds  Diet exercise discussed follow-up in 6 months warning signs discussed

## 2020-05-24 IMAGING — DX CHEST - 2 VIEW
2 series · 2 of 2 positions shown · non-contrast
Comparison: December 20, 2009

CLINICAL DATA: Cough for 3 weeks

EXAM:
CHEST - 2 VIEW

[chest pa]
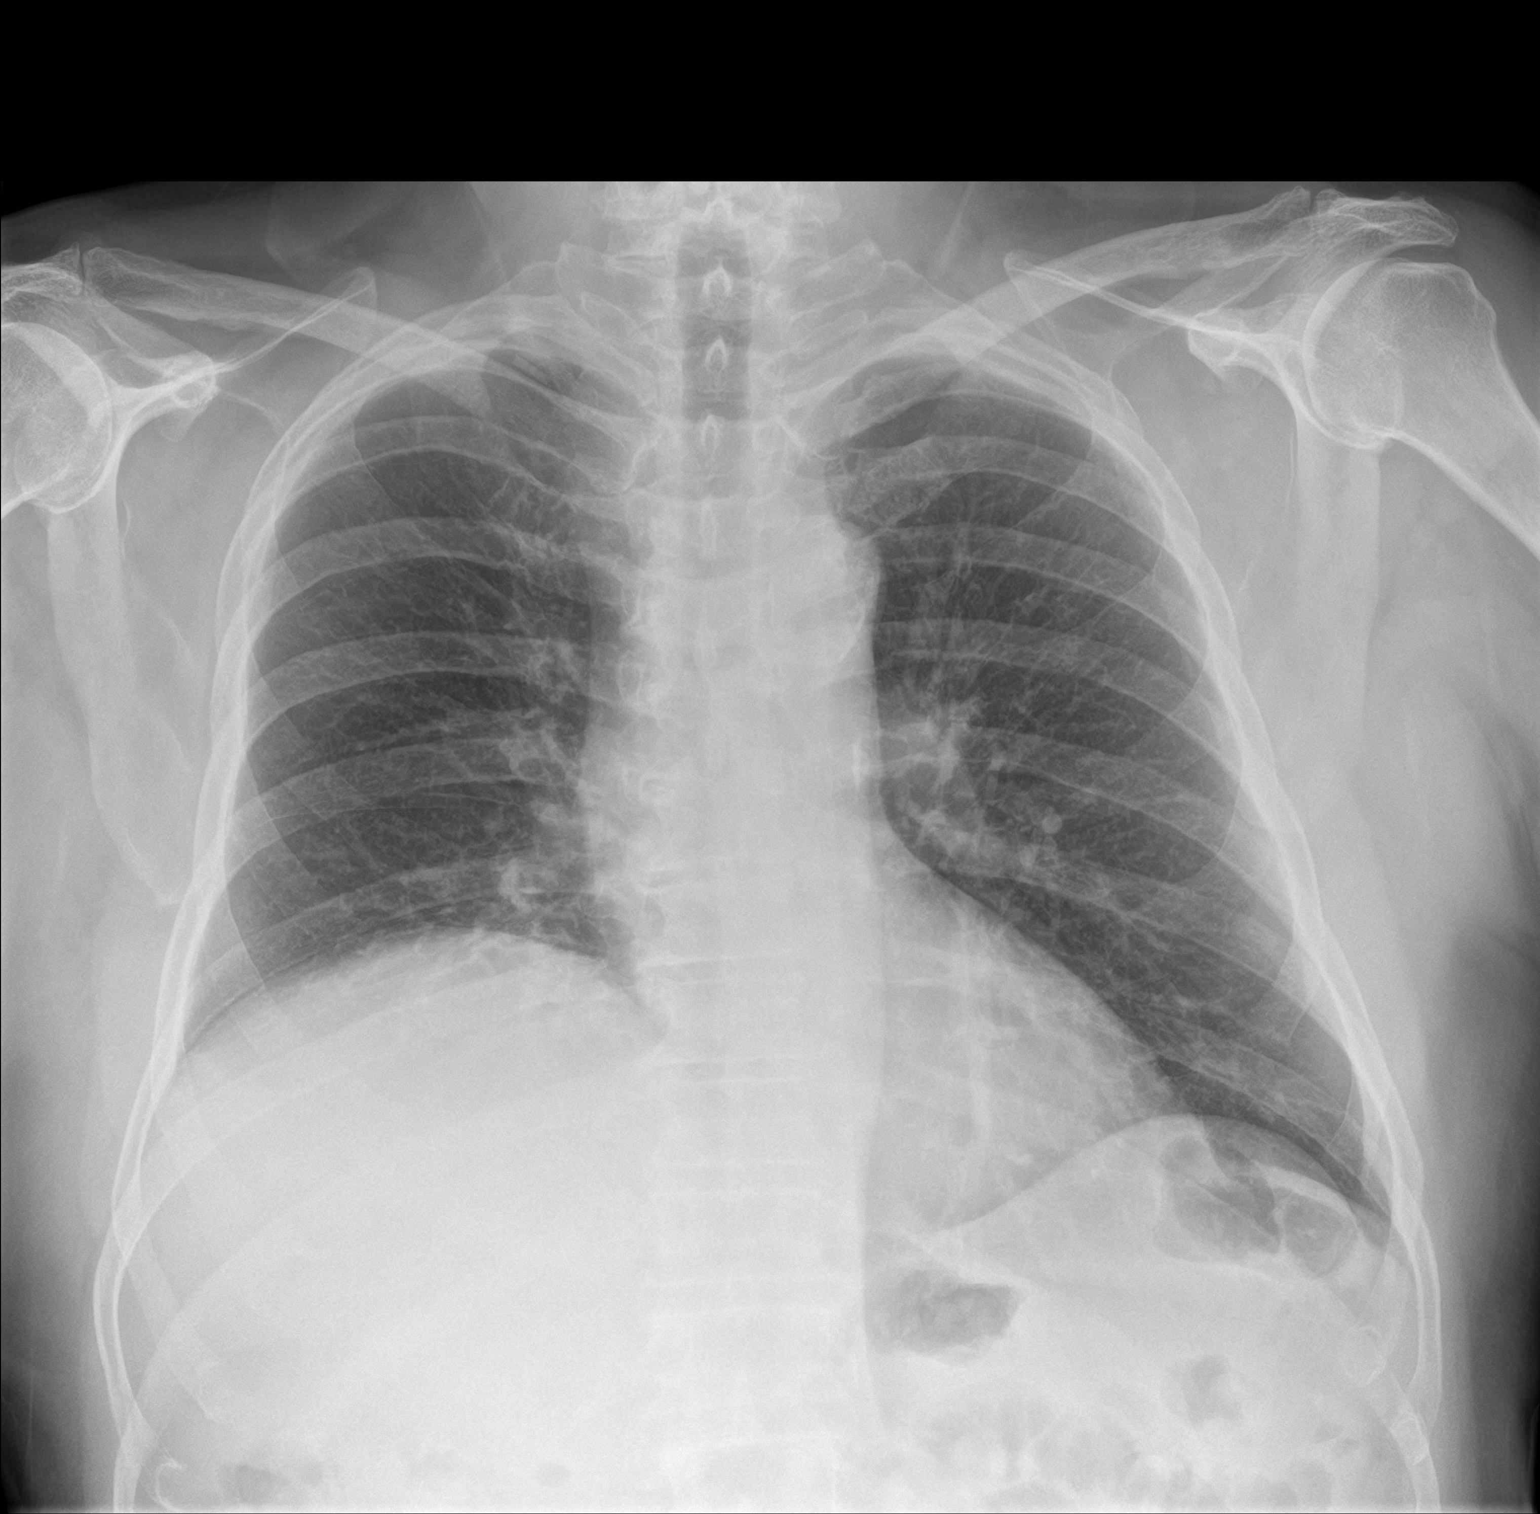

[chest lat]
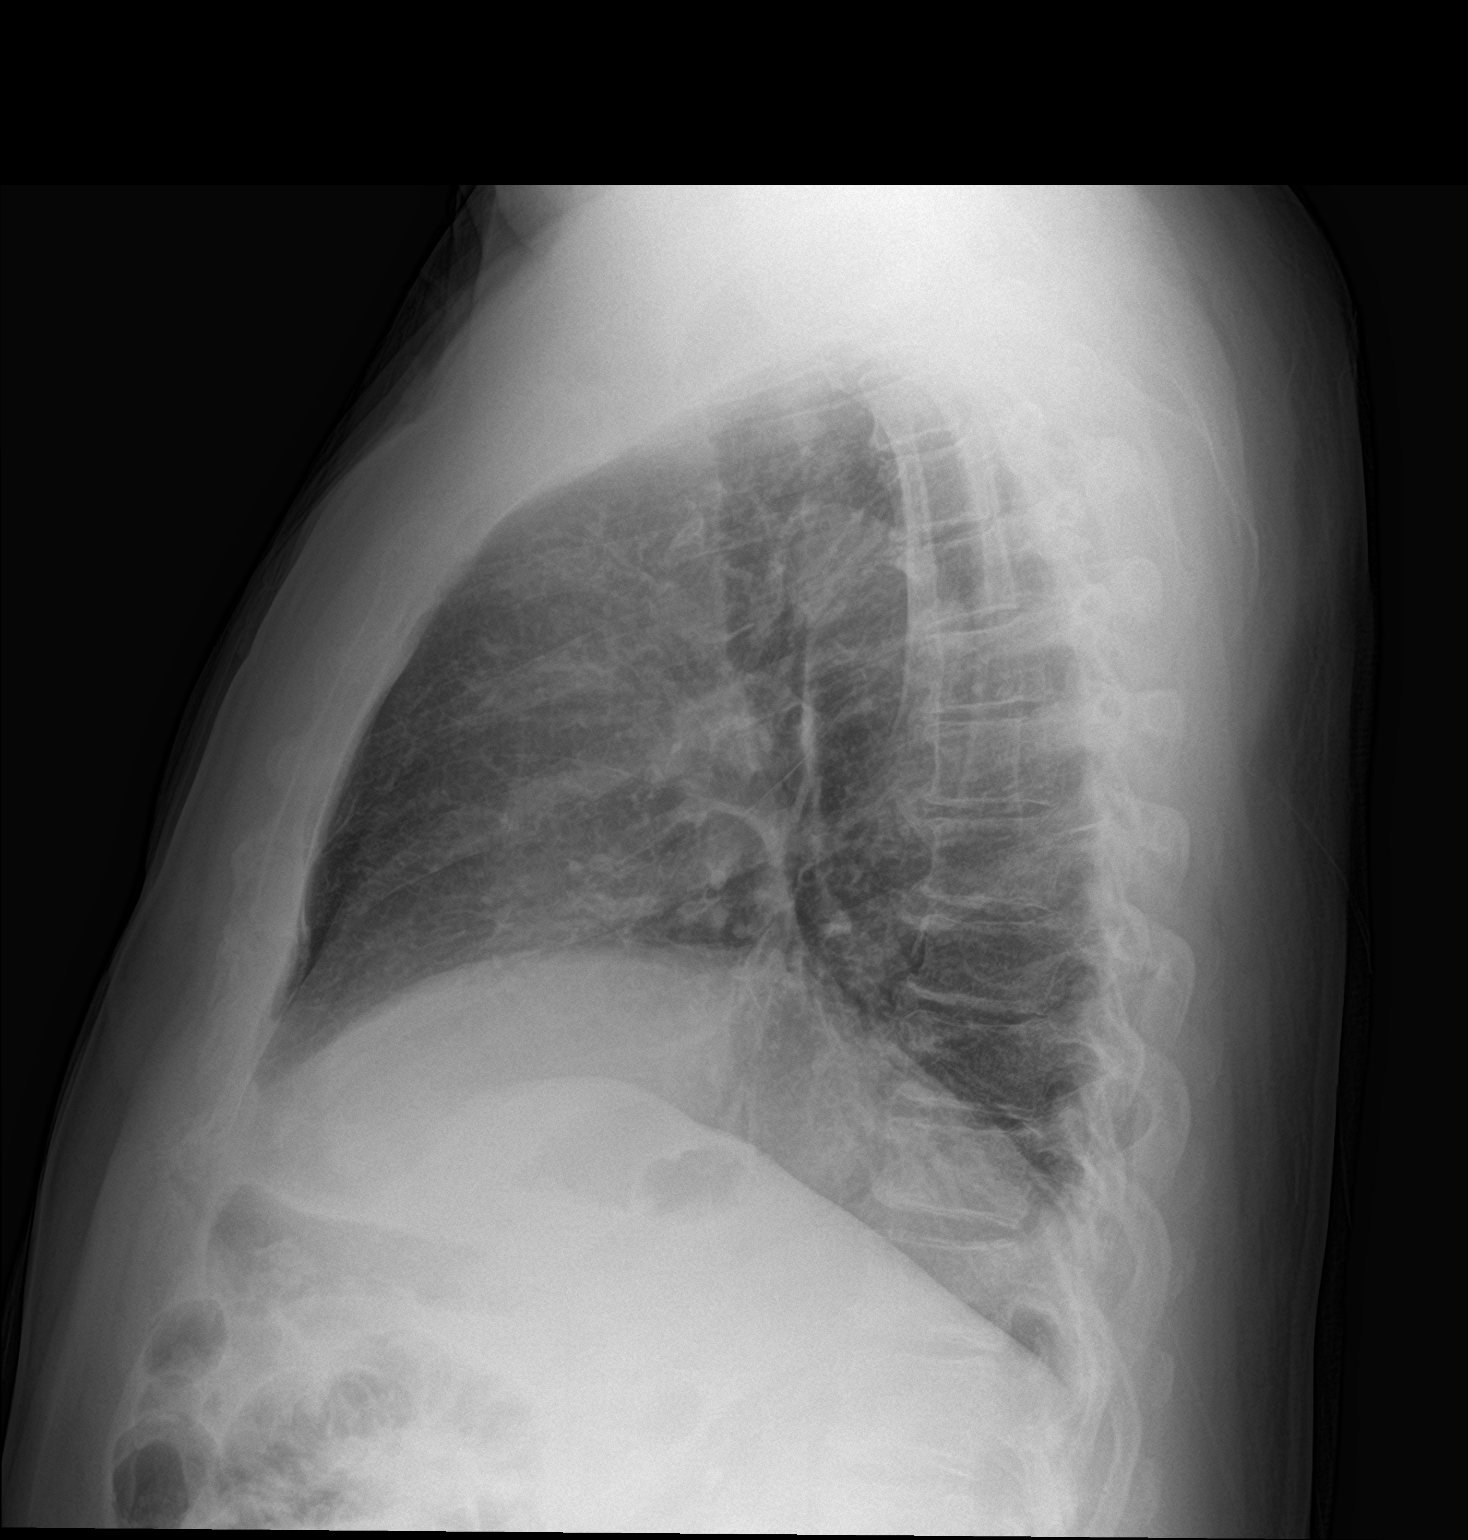

[2 of 2 positions shown; findings below may reference images not displayed]

FINDINGS: The heart size and mediastinal contours are within normal limits.
The lung volumes are low. There is no focal infiltrate, pulmonary
edema, or pleural effusion. The right hemidiaphragm is elevated,
chronic. The visualized skeletal structures are stable.
IMPRESSION: No active cardiopulmonary disease.

## 2020-06-30 DIAGNOSIS — H43813 Vitreous degeneration, bilateral: Secondary | ICD-10-CM | POA: Diagnosis not present

## 2020-06-30 DIAGNOSIS — H02834 Dermatochalasis of left upper eyelid: Secondary | ICD-10-CM | POA: Diagnosis not present

## 2020-06-30 DIAGNOSIS — H02831 Dermatochalasis of right upper eyelid: Secondary | ICD-10-CM | POA: Diagnosis not present

## 2020-06-30 DIAGNOSIS — H40033 Anatomical narrow angle, bilateral: Secondary | ICD-10-CM | POA: Diagnosis not present

## 2020-06-30 DIAGNOSIS — H40013 Open angle with borderline findings, low risk, bilateral: Secondary | ICD-10-CM | POA: Diagnosis not present

## 2020-09-14 ENCOUNTER — Telehealth: Payer: Self-pay

## 2020-09-14 DIAGNOSIS — I10 Essential (primary) hypertension: Secondary | ICD-10-CM

## 2020-09-14 DIAGNOSIS — E039 Hypothyroidism, unspecified: Secondary | ICD-10-CM

## 2020-09-14 DIAGNOSIS — E785 Hyperlipidemia, unspecified: Secondary | ICD-10-CM

## 2020-09-14 DIAGNOSIS — Z79899 Other long term (current) drug therapy: Secondary | ICD-10-CM

## 2020-09-14 NOTE — Telephone Encounter (Signed)
Patient has 6 month follow up on 11/22 and needing labs done.

## 2020-09-14 NOTE — Telephone Encounter (Signed)
Lipid, liver, a1c, tsh on 03/09/20

## 2020-09-15 NOTE — Telephone Encounter (Signed)
Pls order cbc, cmp, lipids, a1c, and tsh.   Thx. Dr. Lovena Le

## 2020-09-15 NOTE — Telephone Encounter (Signed)
Orders put in and left message to return call to notify pt.  

## 2020-09-17 DIAGNOSIS — I10 Essential (primary) hypertension: Secondary | ICD-10-CM | POA: Diagnosis not present

## 2020-09-17 DIAGNOSIS — E785 Hyperlipidemia, unspecified: Secondary | ICD-10-CM | POA: Diagnosis not present

## 2020-09-17 DIAGNOSIS — E039 Hypothyroidism, unspecified: Secondary | ICD-10-CM | POA: Diagnosis not present

## 2020-09-17 DIAGNOSIS — Z79899 Other long term (current) drug therapy: Secondary | ICD-10-CM | POA: Diagnosis not present

## 2020-09-18 LAB — COMPREHENSIVE METABOLIC PANEL
ALT: 26 IU/L (ref 0–44)
AST: 20 IU/L (ref 0–40)
Albumin/Globulin Ratio: 2.3 — ABNORMAL HIGH (ref 1.2–2.2)
Albumin: 4.3 g/dL (ref 3.7–4.7)
Alkaline Phosphatase: 83 IU/L (ref 44–121)
BUN/Creatinine Ratio: 23 (ref 10–24)
BUN: 18 mg/dL (ref 8–27)
Bilirubin Total: 0.4 mg/dL (ref 0.0–1.2)
CO2: 22 mmol/L (ref 20–29)
Calcium: 9.6 mg/dL (ref 8.6–10.2)
Chloride: 107 mmol/L — ABNORMAL HIGH (ref 96–106)
Creatinine, Ser: 0.8 mg/dL (ref 0.76–1.27)
GFR calc Af Amer: 99 mL/min/{1.73_m2} (ref >59)
GFR calc non Af Amer: 86 mL/min/{1.73_m2} (ref >59)
Globulin, Total: 1.9 g/dL (ref 1.5–4.5)
Glucose: 91 mg/dL (ref 65–99)
Potassium: 4.8 mmol/L (ref 3.5–5.2)
Sodium: 143 mmol/L (ref 134–144)
Total Protein: 6.2 g/dL (ref 6.0–8.5)

## 2020-09-18 LAB — LIPID PANEL
Chol/HDL Ratio: 4 ratio (ref 0.0–5.0)
Cholesterol, Total: 165 mg/dL (ref 100–199)
HDL: 41 mg/dL (ref >39)
LDL Chol Calc (NIH): 105 mg/dL — ABNORMAL HIGH (ref 0–99)
Triglycerides: 105 mg/dL (ref 0–149)
VLDL Cholesterol Cal: 19 mg/dL (ref 5–40)

## 2020-09-18 LAB — CBC WITH DIFFERENTIAL/PLATELET
Basophils Absolute: 0.1 10*3/uL (ref 0.0–0.2)
Basos: 1 %
EOS (ABSOLUTE): 0.3 10*3/uL (ref 0.0–0.4)
Eos: 4 %
Hematocrit: 44.4 % (ref 37.5–51.0)
Hemoglobin: 15.1 g/dL (ref 13.0–17.7)
Immature Grans (Abs): 0 10*3/uL (ref 0.0–0.1)
Immature Granulocytes: 0 %
Lymphocytes Absolute: 2 10*3/uL (ref 0.7–3.1)
Lymphs: 24 %
MCH: 32.8 pg (ref 26.6–33.0)
MCHC: 34 g/dL (ref 31.5–35.7)
MCV: 96 fL (ref 79–97)
Monocytes Absolute: 1 10*3/uL — ABNORMAL HIGH (ref 0.1–0.9)
Monocytes: 12 %
Neutrophils Absolute: 4.9 10*3/uL (ref 1.4–7.0)
Neutrophils: 59 %
Platelets: 200 10*3/uL (ref 150–450)
RBC: 4.61 x10E6/uL (ref 4.14–5.80)
RDW: 12.9 % (ref 11.6–15.4)
WBC: 8.4 10*3/uL (ref 3.4–10.8)

## 2020-09-18 LAB — TSH: TSH: 1.85 u[IU]/mL (ref 0.450–4.500)

## 2020-10-11 ENCOUNTER — Ambulatory Visit: Payer: Medicare Other | Admitting: Family Medicine

## 2020-10-20 ENCOUNTER — Ambulatory Visit (INDEPENDENT_AMBULATORY_CARE_PROVIDER_SITE_OTHER): Payer: Medicare Other | Admitting: Family Medicine

## 2020-10-20 ENCOUNTER — Other Ambulatory Visit: Payer: Self-pay

## 2020-10-20 ENCOUNTER — Encounter: Payer: Self-pay | Admitting: Family Medicine

## 2020-10-20 VITALS — BP 136/82 | HR 89 | Temp 97.6°F | Wt 200.8 lb

## 2020-10-20 DIAGNOSIS — E785 Hyperlipidemia, unspecified: Secondary | ICD-10-CM | POA: Diagnosis not present

## 2020-10-20 DIAGNOSIS — I10 Essential (primary) hypertension: Secondary | ICD-10-CM | POA: Diagnosis not present

## 2020-10-20 DIAGNOSIS — E119 Type 2 diabetes mellitus without complications: Secondary | ICD-10-CM | POA: Diagnosis not present

## 2020-10-20 DIAGNOSIS — E039 Hypothyroidism, unspecified: Secondary | ICD-10-CM | POA: Diagnosis not present

## 2020-10-20 DIAGNOSIS — Z125 Encounter for screening for malignant neoplasm of prostate: Secondary | ICD-10-CM

## 2020-10-20 LAB — POCT GLYCOSYLATED HEMOGLOBIN (HGB A1C): Hemoglobin A1C: 5.7 % — AB (ref 4.0–5.6)

## 2020-10-20 MED ORDER — ATORVASTATIN CALCIUM 40 MG PO TABS: 40.0000 mg | ORAL_TABLET | Freq: Every day | ORAL | 1 refills | Status: DC

## 2020-10-20 MED ORDER — AMLODIPINE BESYLATE 2.5 MG PO TABS: 2.5000 mg | ORAL_TABLET | Freq: Every morning | ORAL | 1 refills | Status: DC

## 2020-10-20 MED ORDER — LEVOTHYROXINE SODIUM 100 MCG PO TABS: 100.0000 ug | ORAL_TABLET | Freq: Every day | ORAL | 1 refills | Status: DC

## 2020-10-20 MED ORDER — LISINOPRIL 40 MG PO TABS: 40.0000 mg | ORAL_TABLET | Freq: Every day | ORAL | 1 refills | Status: DC

## 2020-10-20 NOTE — Progress Notes (Signed)
Patient ID: Cameron Smith, male    DOB: 09-17-1941, 79 y.o.   MRN: [id number]   Chief Complaint  Patient presents with  . Diabetes  . Hypertension  . Hyperlipidemia   Subjective:    HPI  F/u dm, htn, and HLD.  Doing well since last visit.  Dm2- Compliant with medications. Checking blood glucose.   Not seeing any high or low numbers.  Denies polyuria or polydipsia.  Eye exam: over due. No foot concerns- no burning pain, sores or ulcers on the feet. a1c 5.7, today in office.  HTN Pt compliant with BP meds.  No SEs Denies chest pain, sob, LE swelling, or blurry vision.  Taking amlodipine in am and lisinopril at night. Seeing 120 or below at home at night and lower number in 38s.   HLD- doing well no new concerns.  Compliant with meds. No chest pain, palpitations, myalgias or joint pains.  Vertigo-intermittent 1-2 x per year for meclazine. Getting dizziness.  No falling or vomiting.   Medical History Cameron Smith has a past medical history of Acute inflammation of nasal sinus, Anxiety, Diverticulitis, Hypercholesteremia, Hypertension, Hypothyroidism, Nasal turbinate hypertrophy, and Reactive airways dysfunction syndrome ([place]).   Outpatient Encounter Medications as of 10/20/2020  Medication Sig  . amLODipine (NORVASC) 2.5 MG tablet Take 1 tablet (2.5 mg total) by mouth every morning.  Marland Kitchen aspirin EC 81 MG tablet Take 81 mg by mouth every evening.  Marland Kitchen atorvastatin (LIPITOR) 40 MG tablet Take 1 tablet (40 mg total) by mouth daily.  . Coenzyme Q10 ([employer] COQ-10) 200 MG capsule Take 200 mg by mouth every evening.  Marland Kitchen levothyroxine (SYNTHROID) 100 MCG tablet Take 1 tablet (100 mcg total) by mouth daily.  Marland Kitchen lisinopril (ZESTRIL) 40 MG tablet Take 1 tablet (40 mg total) by mouth daily.  . meclizine (ANTIVERT) 25 MG tablet Take 1 tablet (25 mg total) by mouth 3 (three) times daily as needed for dizziness.  . mometasone (ELOCON) 0.1 % cream Apply QHS to ears prn itch (Patient taking  differently: Apply 1 application topically daily as needed (itching). Apply QHS to ears prn itch)  . vitamin E 400 UNIT capsule Take 400 Units by mouth every evening.   . [DISCONTINUED] amLODipine (NORVASC) 2.5 MG tablet Take 1 tablet (2.5 mg total) by mouth every morning.  . [DISCONTINUED] atorvastatin (LIPITOR) 40 MG tablet Take 1 tablet (40 mg total) by mouth daily.  . [DISCONTINUED] HYDROcodone-acetaminophen (NORCO/VICODIN) 5-325 MG tablet Take 1 tablet by mouth every 6 (six) hours as needed.  . [DISCONTINUED] HYDROcodone-homatropine (HYCODAN) 5-1.5 MG/5ML syrup Take 5 mLs by mouth at bedtime as needed for cough.  . [DISCONTINUED] levothyroxine (SYNTHROID) 100 MCG tablet Take 1 tablet (100 mcg total) by mouth daily.  . [DISCONTINUED] lisinopril (ZESTRIL) 40 MG tablet Take 1 tablet (40 mg total) by mouth daily.   No facility-administered encounter medications on file as of 10/20/2020.     Review of Systems  Constitutional: Negative for chills and fever.  HENT: Negative for congestion, rhinorrhea and sore throat.   Respiratory: Negative for cough, shortness of breath and wheezing.   Cardiovascular: Negative for chest pain and leg swelling.  Gastrointestinal: Negative for abdominal pain, diarrhea, nausea and vomiting.  Genitourinary: Negative for dysuria and frequency.  Skin: Negative for rash.  Neurological: Negative for dizziness, weakness and headaches.     Vitals BP 136/82   Pulse 89   Temp 97.6 F (36.4 C)   Wt 200 lb 12.8 oz (91.1 kg)  SpO2 95%   BMI 31.45 kg/m   Objective:   Physical Exam Vitals and nursing note reviewed.  Constitutional:      General: He is not in acute distress.    Appearance: Normal appearance. He is not ill-appearing.  HENT:     Head: Normocephalic.     Nose: Nose normal. No congestion.     Mouth/Throat:     Mouth: Mucous membranes are moist.     Pharynx: No oropharyngeal exudate.  Eyes:     Extraocular Movements: Extraocular movements  intact.     Conjunctiva/sclera: Conjunctivae normal.     Pupils: Pupils are equal, round, and reactive to light.  Cardiovascular:     Rate and Rhythm: Normal rate and regular rhythm.     Pulses: Normal pulses.     Heart sounds: Normal heart sounds. No murmur heard.   Pulmonary:     Effort: Pulmonary effort is normal.     Breath sounds: Normal breath sounds. No wheezing, rhonchi or rales.  Musculoskeletal:        General: Normal range of motion.     Right lower leg: No edema.     Left lower leg: No edema.  Skin:    General: Skin is warm and dry.     Findings: No rash.  Neurological:     General: No focal deficit present.     Mental Status: He is alert and oriented to person, place, and time.     Cranial Nerves: No cranial nerve deficit.  Psychiatric:        Mood and Affect: Mood normal.        Behavior: Behavior normal.        Thought Content: Thought content normal.        Judgment: Judgment normal.      Assessment and Plan   1. Diabetes mellitus without complication ([place]) - POCT glycosylated hemoglobin (Hb A1C) - Hemoglobin A1c; Future  2. Screening PSA (prostate specific antigen) - PSA; Future  3. Hyperlipidemia LDL goal <130  4. Essential hypertension, benign  5. Hypothyroidism, unspecified type   DM2- stable. a1c 5.7.  Pt needing to get eye exam. Pt stating he will call.  htn- suboptimal.  Dec salt intake and cont meds.  Pt reporting bp at home is controlled. Will recheck on next visit.  Hypothyroid- stable, cont meds.  HLD- stable. Controlled. Cont meds.  F/u 65mo or prn.

## 2020-11-05 ENCOUNTER — Telehealth: Payer: Self-pay | Admitting: Dermatology

## 2020-11-26 NOTE — Telephone Encounter (Signed)
error 

## 2020-11-26 NOTE — Telephone Encounter (Signed)
errr

## 2021-04-11 ENCOUNTER — Other Ambulatory Visit: Payer: Self-pay | Admitting: *Deleted

## 2021-04-11 DIAGNOSIS — E119 Type 2 diabetes mellitus without complications: Secondary | ICD-10-CM

## 2021-04-11 DIAGNOSIS — Z125 Encounter for screening for malignant neoplasm of prostate: Secondary | ICD-10-CM

## 2021-04-19 DIAGNOSIS — E119 Type 2 diabetes mellitus without complications: Secondary | ICD-10-CM | POA: Diagnosis not present

## 2021-04-20 LAB — PSA: Prostate Specific Ag, Serum: 0.5 ng/mL (ref 0.0–4.0)

## 2021-04-20 LAB — HEMOGLOBIN A1C
Est. average glucose Bld gHb Est-mCnc: 128 mg/dL
Hgb A1c MFr Bld: 6.1 % — ABNORMAL HIGH (ref 4.8–5.6)

## 2021-04-21 ENCOUNTER — Ambulatory Visit: Payer: Medicare Other | Admitting: Family Medicine

## 2021-04-28 ENCOUNTER — Ambulatory Visit: Payer: Medicare Other | Admitting: Family Medicine

## 2021-05-11 ENCOUNTER — Ambulatory Visit (INDEPENDENT_AMBULATORY_CARE_PROVIDER_SITE_OTHER): Payer: Medicare Other | Admitting: Family Medicine

## 2021-05-11 ENCOUNTER — Other Ambulatory Visit: Payer: Self-pay

## 2021-05-11 ENCOUNTER — Encounter: Payer: Self-pay | Admitting: Family Medicine

## 2021-05-11 VITALS — BP 187/77 | HR 63 | Temp 97.2°F | Wt 197.2 lb

## 2021-05-11 DIAGNOSIS — R7301 Impaired fasting glucose: Secondary | ICD-10-CM | POA: Diagnosis not present

## 2021-05-11 DIAGNOSIS — R300 Dysuria: Secondary | ICD-10-CM | POA: Diagnosis not present

## 2021-05-11 DIAGNOSIS — E039 Hypothyroidism, unspecified: Secondary | ICD-10-CM | POA: Diagnosis not present

## 2021-05-11 DIAGNOSIS — I10 Essential (primary) hypertension: Secondary | ICD-10-CM

## 2021-05-11 DIAGNOSIS — E785 Hyperlipidemia, unspecified: Secondary | ICD-10-CM | POA: Diagnosis not present

## 2021-05-11 LAB — POCT URINALYSIS DIPSTICK
Spec Grav, UA: 1.01 (ref 1.010–1.025)
pH, UA: 7 (ref 5.0–8.0)

## 2021-05-11 MED ORDER — LEVOTHYROXINE SODIUM 100 MCG PO TABS: 100.0000 ug | ORAL_TABLET | Freq: Every day | ORAL | 1 refills | Status: DC

## 2021-05-11 MED ORDER — AMLODIPINE BESYLATE 5 MG PO TABS: 5.0000 mg | ORAL_TABLET | Freq: Every day | ORAL | 1 refills | Status: DC

## 2021-05-11 MED ORDER — ATORVASTATIN CALCIUM 40 MG PO TABS: 40.0000 mg | ORAL_TABLET | Freq: Every day | ORAL | 1 refills | Status: DC

## 2021-05-11 MED ORDER — LISINOPRIL 40 MG PO TABS: 40.0000 mg | ORAL_TABLET | Freq: Every day | ORAL | 1 refills | Status: DC

## 2021-05-11 NOTE — Progress Notes (Signed)
Patient ID: Cameron Smith, male    DOB: 03-Dec-1940, 80 y.o.   MRN: [id number]   Chief Complaint  Patient presents with   Hypertension   Subjective:    HPI Pt here for follow up on HTN. Pt states doing well and checking blood pressure at home; night time is low. Takes Norvasc in morning and lisinopril at night.  Dm2- last a1c -Slight increase in a1c 5.7, now at 6.1.  HTN Pt compliant with BP meds.  No SEs Denies chest pain, sob, LE swelling, or blurry vision.  Not checking bp until at night.  Running 100/50 in evening.  Checking 1x per week.  Has a lot of headaches and taking lots of BC powders.  Hit his head in back a few yrs ago. Saw neurology and was told the headaches were related to his head injury.   Medical History Cameron Smith has a past medical history of Acute inflammation of nasal sinus, Anxiety, Diverticulitis, Hypercholesteremia, Hypertension, Hypothyroidism, Nasal turbinate hypertrophy, and Reactive airways dysfunction syndrome ([place]).   Outpatient Encounter Medications as of 05/11/2021  Medication Sig   amLODipine (NORVASC) 5 MG tablet Take 1 tablet (5 mg total) by mouth daily.   aspirin EC 81 MG tablet Take 81 mg by mouth every evening.   Coenzyme Q10 200 MG capsule Take 200 mg by mouth every evening.   meclizine (ANTIVERT) 25 MG tablet Take 1 tablet (25 mg total) by mouth 3 (three) times daily as needed for dizziness.   mometasone (ELOCON) 0.1 % cream Apply QHS to ears prn itch (Patient taking differently: Apply 1 application topically daily as needed (itching). Apply QHS to ears prn itch)   vitamin E 400 UNIT capsule Take 400 Units by mouth every evening.    [DISCONTINUED] amLODipine (NORVASC) 2.5 MG tablet Take 1 tablet (2.5 mg total) by mouth every morning.   [DISCONTINUED] atorvastatin (LIPITOR) 40 MG tablet Take 1 tablet (40 mg total) by mouth daily.   [DISCONTINUED] levothyroxine (SYNTHROID) 100 MCG tablet Take 1 tablet (100 mcg total) by mouth daily.    [DISCONTINUED] lisinopril (ZESTRIL) 40 MG tablet Take 1 tablet (40 mg total) by mouth daily.   atorvastatin (LIPITOR) 40 MG tablet Take 1 tablet (40 mg total) by mouth daily.   levothyroxine (SYNTHROID) 100 MCG tablet Take 1 tablet (100 mcg total) by mouth daily.   lisinopril (ZESTRIL) 40 MG tablet Take 1 tablet (40 mg total) by mouth daily.   No facility-administered encounter medications on file as of 05/11/2021.     Review of Systems  Constitutional:  Negative for chills and fever.  HENT:  Negative for congestion, rhinorrhea and sore throat.   Respiratory:  Negative for cough, shortness of breath and wheezing.   Cardiovascular:  Negative for chest pain and leg swelling.  Gastrointestinal:  Negative for abdominal pain, diarrhea, nausea and vomiting.  Genitourinary:  Negative for dysuria and frequency.  Skin:  Negative for rash.  Neurological:  Negative for dizziness, weakness and headaches.    Vitals BP (!) 187/77   Pulse 63   Temp (!) 97.2 F (36.2 C)   Wt 197 lb 3.2 oz (89.4 kg)   SpO2 96%   BMI 30.89 kg/m   Objective:   Physical Exam Vitals and nursing note reviewed.  Constitutional:      General: He is not in acute distress.    Appearance: Normal appearance. He is not ill-appearing.  HENT:     Head: Normocephalic.     Nose: Nose  normal. No congestion.     Mouth/Throat:     Mouth: Mucous membranes are moist.     Pharynx: No oropharyngeal exudate.  Eyes:     Extraocular Movements: Extraocular movements intact.     Conjunctiva/sclera: Conjunctivae normal.     Pupils: Pupils are equal, round, and reactive to light.  Cardiovascular:     Rate and Rhythm: Normal rate and regular rhythm.     Pulses: Normal pulses.     Heart sounds: Normal heart sounds. No murmur heard. Pulmonary:     Effort: Pulmonary effort is normal.     Breath sounds: Normal breath sounds. No wheezing, rhonchi or rales.  Musculoskeletal:        General: Normal range of motion.     Right lower  leg: No edema.     Left lower leg: No edema.  Skin:    General: Skin is warm and dry.     Findings: No rash.  Neurological:     General: No focal deficit present.     Mental Status: He is alert and oriented to person, place, and time.     Cranial Nerves: No cranial nerve deficit.  Psychiatric:        Mood and Affect: Mood normal.        Behavior: Behavior normal.        Thought Content: Thought content normal.        Judgment: Judgment normal.     Assessment and Plan   1. Essential hypertension, benign - CMP14+EGFR  2. Hyperlipidemia LDL goal <130 - Lipid panel  3. Hypothyroidism, unspecified type - TSH  4. Dysuria - POCT Urinalysis Dipstick  5. Impaired fasting glucose   Headaches/ h/o subdural hematoma- post traumatic- Reviewed with pt to avoid taking lots of bc powders due to rebound headaches but also inc risk of bleeding and GI bleeds. Try tylenol or other meds if needed for headaches. Heat to neck or massage.  Hld- stable.  Cont meds.  Tg -slight elevated, dec carbs.  Hypothyroid- stable. Cont meds.  Htn- uncontrolled.  Cont meds. Advising pt to recheck bp this week and call with numbers.  Pt was on 68m lisinopril at night and 2.560mnorvasc in am. Increased norvasc to 33m80mn am and cont with 60m733msinopril at night. Labs reviewed- stable.   Impaired fasting gluc- last a1c- 6.1, slight inc, last time at 5.7.  dec carbs in diet and inc in exercising.  Dysuria- checked urine. Negative for infection. Cont to monitor.  Return in about 6 months (around 11/10/2021) for htn.  Addendum- pt brought in bp reading from home from 05/11/21-05/16/21- slight elevated bp in mornings before norvasc had started working,  but improved by evenings 130-140s/80s.

## 2021-05-11 NOTE — Patient Instructions (Signed)
Take 5mg  amlodipoine.  Continue to take 40mg  lisinopril at night.  Check for 7 days and call office with numbers.   Call if seeing numbers under 100/70 or above 140/85.

## 2021-05-16 ENCOUNTER — Telehealth: Payer: Self-pay | Admitting: Family Medicine

## 2021-05-16 DIAGNOSIS — I10 Essential (primary) hypertension: Secondary | ICD-10-CM | POA: Diagnosis not present

## 2021-05-16 DIAGNOSIS — E039 Hypothyroidism, unspecified: Secondary | ICD-10-CM | POA: Diagnosis not present

## 2021-05-16 DIAGNOSIS — E785 Hyperlipidemia, unspecified: Secondary | ICD-10-CM | POA: Diagnosis not present

## 2021-05-16 NOTE — Telephone Encounter (Signed)
Pt contacted. Pt states he is taking Lisinopil at night and amlodipine in the morning. Pt took BP before taking meds. Pt also had lab work done this morning. Pt feeling fine with no issues.

## 2021-05-16 NOTE — Telephone Encounter (Signed)
Pt verbalized understanding.

## 2021-05-16 NOTE — Telephone Encounter (Signed)
Pt came by at 8:27am on 05/16/2021 to drop off blood pressure readings.

## 2021-05-18 LAB — CMP14+EGFR
ALT: 26 IU/L (ref 0–44)
AST: 19 IU/L (ref 0–40)
Albumin/Globulin Ratio: 2 (ref 1.2–2.2)
Albumin: 4.1 g/dL (ref 3.7–4.7)
Alkaline Phosphatase: 65 IU/L (ref 44–121)
BUN/Creatinine Ratio: 16 (ref 10–24)
BUN: 14 mg/dL (ref 8–27)
Bilirubin Total: 0.6 mg/dL (ref 0.0–1.2)
CO2: 26 mmol/L (ref 20–29)
Calcium: 10 mg/dL (ref 8.6–10.2)
Chloride: 102 mmol/L (ref 96–106)
Creatinine, Ser: 0.86 mg/dL (ref 0.76–1.27)
Globulin, Total: 2.1 g/dL (ref 1.5–4.5)
Glucose: 90 mg/dL (ref 65–99)
Potassium: 5.1 mmol/L (ref 3.5–5.2)
Sodium: 142 mmol/L (ref 134–144)
Total Protein: 6.2 g/dL (ref 6.0–8.5)
eGFR: 88 mL/min/{1.73_m2} (ref >59)

## 2021-05-18 LAB — LIPID PANEL
Chol/HDL Ratio: 4.5 ratio (ref 0.0–5.0)
Cholesterol, Total: 188 mg/dL (ref 100–199)
HDL: 42 mg/dL (ref >39)
LDL Chol Calc (NIH): 109 mg/dL — ABNORMAL HIGH (ref 0–99)
Triglycerides: 215 mg/dL — ABNORMAL HIGH (ref 0–149)
VLDL Cholesterol Cal: 37 mg/dL (ref 5–40)

## 2021-05-18 LAB — TSH: TSH: 0.843 u[IU]/mL (ref 0.450–4.500)

## 2021-06-15 DIAGNOSIS — H02831 Dermatochalasis of right upper eyelid: Secondary | ICD-10-CM | POA: Diagnosis not present

## 2021-06-15 DIAGNOSIS — H02834 Dermatochalasis of left upper eyelid: Secondary | ICD-10-CM | POA: Diagnosis not present

## 2021-06-15 DIAGNOSIS — Z9889 Other specified postprocedural states: Secondary | ICD-10-CM | POA: Diagnosis not present

## 2021-06-15 DIAGNOSIS — H2513 Age-related nuclear cataract, bilateral: Secondary | ICD-10-CM | POA: Diagnosis not present

## 2021-06-15 DIAGNOSIS — H40013 Open angle with borderline findings, low risk, bilateral: Secondary | ICD-10-CM | POA: Diagnosis not present

## 2021-06-15 DIAGNOSIS — H43813 Vitreous degeneration, bilateral: Secondary | ICD-10-CM | POA: Diagnosis not present

## 2021-06-15 DIAGNOSIS — H40033 Anatomical narrow angle, bilateral: Secondary | ICD-10-CM | POA: Diagnosis not present

## 2021-08-02 ENCOUNTER — Other Ambulatory Visit: Payer: Self-pay

## 2021-08-02 ENCOUNTER — Ambulatory Visit (INDEPENDENT_AMBULATORY_CARE_PROVIDER_SITE_OTHER): Payer: Medicare Other

## 2021-08-02 VITALS — BP 150/68 | HR 76 | Temp 98.6°F | Ht 67.5 in | Wt 196.6 lb

## 2021-08-02 DIAGNOSIS — Z0000 Encounter for general adult medical examination without abnormal findings: Secondary | ICD-10-CM | POA: Diagnosis not present

## 2021-08-02 NOTE — Progress Notes (Signed)
Subjective:   Cameron Smith is a 80 y.o. male who presents for Medicare Annual/Subsequent preventive examination.  Review of Systems     Cardiac Risk Factors include: advanced age (>57mn, >>45women);dyslipidemia;hypertension;obesity (BMI >30kg/m2);sedentary lifestyle;male gender     Objective:    Today's Vitals   08/02/21 0852  BP: (!) 150/68  Pulse: 76  Temp: 98.6 F (37 C)  Weight: 196 lb 9.6 oz (89.2 kg)  Height: 5' 7.5" (1.715 m)   Body mass index is 30.34 kg/m.  Advanced Directives 08/02/2021 08/22/2019 10/11/2016 10/31/2011  Does Patient Have a Medical Advance Directive? No No Yes Patient does not have advance directive;Patient would not like information  Type of Advance Directive - - Living will;Healthcare [place] of [place]? - - No - copy requested -  Would patient like information on creating a medical advance directive? No - Patient declined No - Patient declined - -    Current Medications (verified) Outpatient Encounter Medications as of 08/02/2021  Medication Sig   amLODipine (NORVASC) 5 MG tablet Take 1 tablet (5 mg total) by mouth daily.   aspirin EC 81 MG tablet Take 81 mg by mouth every evening.   atorvastatin (LIPITOR) 40 MG tablet Take 1 tablet (40 mg total) by mouth daily.   Coenzyme Q10 200 MG capsule Take 200 mg by mouth every evening.   levothyroxine (SYNTHROID) 100 MCG tablet Take 1 tablet (100 mcg total) by mouth daily.   lisinopril (ZESTRIL) 40 MG tablet Take 1 tablet (40 mg total) by mouth daily.   meclizine (ANTIVERT) 25 MG tablet Take 1 tablet (25 mg total) by mouth 3 (three) times daily as needed for dizziness.   mometasone (ELOCON) 0.1 % cream Apply QHS to ears prn itch (Patient taking differently: Apply 1 application topically daily as needed (itching). Apply QHS to ears prn itch)   vitamin E 400 UNIT capsule Take 400 Units by mouth every evening.    No facility-administered encounter  medications on file as of 08/02/2021.    Allergies (verified) Flomax [tamsulosin hcl]   History: Past Medical History:  Diagnosis Date   Acute inflammation of nasal sinus    Chronic   Anxiety    Diverticulitis    Hypercholesteremia    Hypertension    Hypothyroidism    Nasal turbinate hypertrophy    Reactive airways dysfunction syndrome ([facility]    Past Surgical History:  Procedure Laterality Date   BACK SURGERY     sciatic   COLONOSCOPY  10/31/2011   Procedure: COLONOSCOPY;  Surgeon: RDaneil Dolin MD;  Location[facility];  Service: Endoscopy;  Laterality: N/A;  10:30 AM   NASAL SEPTOPLASTY W/ TURBINOPLASTY Bilateral 08/22/2019   Procedure: Sinus Endoscopy   Nasal Septoplasty with Bilateral TURBINATE REDUCTION, Bilateral Total Ethmoidectomy and Sphenoidotomy;  Surgeon: BMelida Quitter MD;  [facility];  Service: ENT;  Laterality: Bilateral;   NASAL SINUS SURGERY     Family History  Problem Relation Age of Onset   Heart disease Father    Diabetes Sister    Colon cancer Neg Hx    Social History   Socioeconomic History   Marital status: Married    Spouse name: Not on file   Number of children: Not on file   Years of education: Not on file   Highest education level: Not on file  Occupational History   Not on file  Tobacco Use   Smoking status: Former  Packs/day: 3.00    Years: 16.00    Pack years: 48.00    Types: Cigarettes    Quit date: 65    Years since quitting: 40.7   Smokeless tobacco: Never  Vaping Use   Vaping Use: Never used  Substance and Sexual Activity   Alcohol use: No   Drug use: No   Sexual activity: Not on file  Other Topics Concern   Not on file  Social History Narrative   Married x 60 years. 2 children, 4 grands, 4 great grands   Social Determinants of Health   Financial Resource Strain: Low Risk    Difficulty of Paying Living Expenses: Not hard at all  Food Insecurity: No Food Insecurity   Worried About Charity fundraiser in  the Last Year: Never true   Ran Out of Food in the Last Year: Never true  Transportation Needs: No Transportation Needs   Lack of Transportation (Medical): No   Lack of Transportation (Non-Medical): No  Physical Activity: Insufficiently Active   Days of Exercise per Week: 3 days   Minutes of Exercise per Session: 30 min  Stress: No Stress Concern Present   Feeling of Stress : Not at all  Social Connections: Socially Integrated   Frequency of Communication with Friends and Family: More than three times a week   Frequency of Social Gatherings with Friends and Family: More than three times a week   Attends Religious Services: More than 4 times per year   Active Member of [employer] or Organizations: Yes   Attends Music therapist: More than 4 times per year   Marital Status: Married    Tobacco Counseling Counseling given: Not Answered   Clinical Intake:  Pre-visit preparation completed: Yes  Pain : No/denies pain     BMI - recorded: 30.34 Nutritional Status: BMI > 30  Obese Nutritional Risks: None Diabetes: No  How often do you need to have someone help you when you read instructions, pamphlets, or other written materials from your doctor or pharmacy?: 1 - Never  Diabetic? NO  Interpreter Needed?: No  Information entered by :: Randal Buba, LPN   Activities of Daily Living In your present state of health, do you have any difficulty performing the following activities: 08/02/2021  Hearing? Y  Vision? N  Difficulty concentrating or making decisions? N  Walking or climbing stairs? N  Dressing or bathing? N  Doing errands, shopping? N  Preparing Food and eating ? N  Using the Toilet? N  In the past six months, have you accidently leaked urine? N  Do you have problems with loss of bowel control? N  Managing your Medications? N  Managing your Finances? N  Housekeeping or managing your Housekeeping? N  Some recent data might be hidden    Patient Care  Team: Erven Colla, DO as PCP - General (Family Medicine)  Indicate any recent Medical Services you may have received from other than [facility] providers in the past year (date may be approximate).     Assessment:   This is a routine wellness examination for [facility].  Hearing/Vision screen Hearing Screening - Comments:: Some hearing issues.  Vision Screening - Comments:: Glasses. Up to date on eye exam. 06/2021 per pt. Dr. Katy Fitch.  Dietary issues and exercise activities discussed: Current Exercise Habits: The patient does not participate in regular exercise at present, Exercise limited by: cardiac condition(s)   Goals Addressed  This Visit's Progress    Exercise 3x per week (30 min per time)       Wants to fish more.      Depression Screen PHQ 2/9 Scores 08/02/2021 05/11/2021 10/20/2020 02/26/2018 02/09/2017  PHQ - 2 Score 0 0 0 0 3  PHQ- 9 Score - - - - 3    Fall Risk Fall Risk  08/02/2021 05/11/2021 10/20/2020 04/13/2020 06/19/2019  Falls in the past year? 0 0 0 0 (No Data)  Comment - - - - Emmi Telephone Survey: data to providers prior to load  Number falls in past yr: 0 0 - - (No Data)  Comment - - - - Emmi Telephone Survey Actual Response =   Injury with Fall? 0 0 - - -  Risk for fall due to : No Fall Risks No Fall Risks - - -  Follow up Falls prevention discussed Falls evaluation completed Falls evaluation completed Falls evaluation completed -    FALL RISK PREVENTION PERTAINING TO THE HOME:  Any stairs in or around the home? Yes  If so, are there any without handrails? N  Home free of loose throw rugs in walkways, pet beds, electrical cords, etc? Yes  Adequate lighting in your home to reduce risk of falls? Yes   ASSISTIVE DEVICES UTILIZED TO PREVENT FALLS:  Life alert? No  Use of a cane, walker or w/c? No  Grab bars in the bathroom? Yes  Shower chair or bench in shower? Yes  Elevated toilet seat or a handicapped toilet? Yes   TIMED UP AND GO:  Was the  test performed? Yes .  Length of time to ambulate 10 feet: 5 sec.   Gait steady and fast with assistive device  Cognitive Function:     6CIT Screen 08/02/2021  What Year? 0 points  What month? 0 points  What time? 0 points  Count back from 20 0 points  Months in reverse 0 points  Repeat phrase 0 points  Total Score 0    Immunizations Immunization History  Administered Date(s) Administered   Influenza, High Dose Seasonal PF 09/01/2019   Influenza,inj,Quad PF,6+ Mos 08/04/2014, 08/04/2016, 08/13/2017, 08/23/2018   Influenza-Unspecified 09/11/2013, 09/10/2015, 09/02/2019, 09/28/2020   Moderna Sars-Covid-2 Vaccination 12/13/2019, 01/12/2020, 10/01/2020, 03/11/2021   Pneumococcal Conjugate-13 09/18/2012   Zoster, Live 02/27/2008    TDAP status: Due, Education has been provided regarding the importance of this vaccine. Advised may receive this vaccine at local pharmacy or Health Dept. Aware to provide a copy of the vaccination record if obtained from local pharmacy or Health Dept. Verbalized acceptance and understanding.  Flu Vaccine status: Due, Education has been provided regarding the importance of this vaccine. Advised may receive this vaccine at local pharmacy or Health Dept. Aware to provide a copy of the vaccination record if obtained from local pharmacy or Health Dept. Verbalized acceptance and understanding.  Pneumococcal vaccine status: Due, Education has been provided regarding the importance of this vaccine. Advised may receive this vaccine at local pharmacy or Health Dept. Aware to provide a copy of the vaccination record if obtained from local pharmacy or Health Dept. Verbalized acceptance and understanding.  Covid-19 vaccine status: Completed vaccines  Qualifies for Shingles Vaccine? Yes   Zostavax completed Yes   Shingrix completed: No, Pt states he received 1st dose 07/29/2021. Due for 2nd dose.   Screening Tests Health Maintenance  Topic Date Due   OPHTHALMOLOGY  EXAM  Never done   TETANUS/TDAP  Never done   Zoster Vaccines-  Shingrix (1 of 2) Never done   PNA vac Low Risk Adult (2 of 2 - PPSV23) 09/18/2013   FOOT EXAM  08/24/2019   INFLUENZA VACCINE  06/20/2021   COVID-19 Vaccine (5 - Booster for Moderna series) 07/11/2021   HEMOGLOBIN A1C  10/19/2021   HPV VACCINES  Aged Out    Health Maintenance  Health Maintenance Due  Topic Date Due   OPHTHALMOLOGY EXAM  Never done   TETANUS/TDAP  Never done   Zoster Vaccines- Shingrix (1 of 2) Never done   PNA vac Low Risk Adult (2 of 2 - PPSV23) 09/18/2013   FOOT EXAM  08/24/2019   INFLUENZA VACCINE  06/20/2021   COVID-19 Vaccine (5 - Booster for Moderna series) 07/11/2021    Colorectal cancer screening: Type of screening: Colonoscopy. Completed 10/31/2011. Repeat every 10 years  Lung Cancer Screening: (Low Dose CT Chest recommended if Age 53-80 years, 30 pack-year currently smoking OR have quit w/in 15years.) does not qualify.     Additional Screening:  Hepatitis C Screening: does not qualify; Completed N/A  Vision Screening: Recommended annual ophthalmology exams for early detection of glaucoma and other disorders of the eye. Is the patient up to date with their annual eye exam?  Yes  Who is the provider or what is the name of the office in which the patient attends annual eye exams? Dr. Katy Fitch If pt is not established with a provider, would they like to be referred to a provider to establish care? No .   Dental Screening: Recommended annual dental exams for proper oral hygiene  Community Resource Referral / Chronic Care Management: CRR required this visit?  No   CCM required this visit?  No      Plan:     I have personally reviewed and noted the following in the patient's chart:   Medical and social history Use of alcohol, tobacco or illicit drugs  Current medications and supplements including opioid prescriptions. Patient is not currently taking opioid prescriptions. Functional  ability and status Nutritional status Physical activity Advanced directives List of other physicians Hospitalizations, surgeries, and ER visits in previous 12 months Vitals Screenings to include cognitive, depression, and falls Referrals and appointments  In addition, I have reviewed and discussed with patient certain preventive protocols, quality metrics, and best practice recommendations. A written personalized care plan for preventive services as well as general preventive health recommendations were provided to patient.     Chriss Driver, LPN   [phone]   Nurse Notes: Pt states he is doing well. Pt states he received 1st Shingrix vaccine 07/29/21 at [facility]. Pt declines 2nd Pneumococcal vaccine. Pt due for Colonoscopy 10/2021 but states he may do Cologuard instead. Advised pt if he decides to do Cologuard, to let office know. Pt verbalized understanding of all. Pt declines AWV for 2023.

## 2021-08-02 NOTE — Patient Instructions (Signed)
Cameron Smith , Thank you for taking time to come for your Medicare Wellness Visit. I appreciate your ongoing commitment to your health goals. Please review the following plan we discussed and let me know if I can assist you in the future.   Screening recommendations/referrals: Colonoscopy: 10/31/2011, due 2022  Recommended yearly ophthalmology/optometry visit for glaucoma screening and checkup Recommended yearly dental visit for hygiene and checkup  Vaccinations: Influenza vaccine: 09/28/2020, due in fall.  Pneumococcal vaccine: 09/18/2012  Tdap vaccine: Due Repeat in 10 years  Shingles vaccine: 07/2021, [facility]. 2nd dose due   Covid-19: 12/13/19 01/12/20 10/01/20 03/11/21   Advanced directives: Advance directive discussed with you today. Even though you declined this today, please call our office should you change your mind, and we can give you the proper paperwork for you to fill out.     Conditions/risks identified: Aim for 30 minutes of exercise or brisk walking each day, drink 6-8 glasses of water and eat lots of fruits and vegetables.   Next appointment: Follow up in one year for your annual wellness visit. Pt declines visit in 1 year.   Preventive Care 79 Years and Older, Male  Preventive care refers to lifestyle choices and visits with your health care provider that can promote health and wellness. What does preventive care include? A yearly physical exam. This is also called an annual well check. Dental exams once or twice a year. Routine eye exams. Ask your health care provider how often you should have your eyes checked. Personal lifestyle choices, including: Daily care of your teeth and gums. Regular physical activity. Eating a healthy diet. Avoiding tobacco and drug use. Limiting alcohol use. Practicing safe sex. Taking low doses of aspirin every day. Taking vitamin and mineral supplements as recommended by your health care provider. What happens during an annual  well check? The services and screenings done by your health care provider during your annual well check will depend on your age, overall health, lifestyle risk factors, and family history of disease. Counseling  Your health care provider may ask you questions about your: Alcohol use. Tobacco use. Drug use. Emotional well-being. Home and relationship well-being. Sexual activity. Eating habits. History of falls. Memory and ability to understand (cognition). Work and work Statistician. Screening  You may have the following tests or measurements: Height, weight, and BMI. Blood pressure. Lipid and cholesterol levels. These may be checked every 5 years, or more frequently if you are over 51 years old. Skin check. Lung cancer screening. You may have this screening every year starting at age 29 if you have a 30-pack-year history of smoking and currently smoke or have quit within the past 15 years. Fecal occult blood test (FOBT) of the stool. You may have this test every year starting at age 7. Flexible sigmoidoscopy or colonoscopy. You may have a sigmoidoscopy every 5 years or a colonoscopy every 10 years starting at age 70. Prostate cancer screening. Recommendations will vary depending on your family history and other risks. Hepatitis C blood test. Hepatitis B blood test. Sexually transmitted disease (STD) testing. Diabetes screening. This is done by checking your blood sugar (glucose) after you have not eaten for a while (fasting). You may have this done every 1-3 years. Abdominal aortic aneurysm (AAA) screening. You may need this if you are a current or former smoker. Osteoporosis. You may be screened starting at age 11 if you are at high risk. Talk with your health care provider about your test results, treatment options,  and if necessary, the need for more tests. Vaccines  Your health care provider may recommend certain vaccines, such as: Influenza vaccine. This is recommended every  year. Tetanus, diphtheria, and acellular pertussis (Tdap, Td) vaccine. You may need a Td booster every 10 years. Zoster vaccine. You may need this after age 72. Pneumococcal 13-valent conjugate (PCV13) vaccine. One dose is recommended after age 54. Pneumococcal polysaccharide (PPSV23) vaccine. One dose is recommended after age 50. Talk to your health care provider about which screenings and vaccines you need and how often you need them. This information is not intended to replace advice given to you by your health care provider. Make sure you discuss any questions you have with your health care provider. Document Released: 12/03/2015 Document Revised: 07/26/2016 Document Reviewed: 09/07/2015 Elsevier Interactive Patient Education  2017 [employer] Prevention in the Home Falls can cause injuries. They can happen to people of all ages. There are many things you can do to make your home safe and to help prevent falls. What can I do on the outside of my home? Regularly fix the edges of walkways and driveways and fix any cracks. Remove anything that might make you trip as you walk through a door, such as a raised step or threshold. Trim any bushes or trees on the path to your home. Use bright outdoor lighting. Clear any walking paths of anything that might make someone trip, such as rocks or tools. Regularly check to see if handrails are loose or broken. Make sure that both sides of any steps have handrails. Any raised decks and porches should have guardrails on the edges. Have any leaves, snow, or ice cleared regularly. Use sand or salt on walking paths during winter. Clean up any spills in your garage right away. This includes oil or grease spills. What can I do in the bathroom? Use night lights. Install grab bars by the toilet and in the tub and shower. Do not use towel bars as grab bars. Use non-skid mats or decals in the tub or shower. If you need to sit down in the shower, use a  plastic, non-slip stool. Keep the floor dry. Clean up any water that spills on the floor as soon as it happens. Remove soap buildup in the tub or shower regularly. Attach bath mats securely with double-sided non-slip rug tape. Do not have throw rugs and other things on the floor that can make you trip. What can I do in the bedroom? Use night lights. Make sure that you have a light by your bed that is easy to reach. Do not use any sheets or blankets that are too big for your bed. They should not hang down onto the floor. Have a firm chair that has side arms. You can use this for support while you get dressed. Do not have throw rugs and other things on the floor that can make you trip. What can I do in the kitchen? Clean up any spills right away. Avoid walking on wet floors. Keep items that you use a lot in easy-to-reach places. If you need to reach something above you, use a strong step stool that has a grab bar. Keep electrical cords out of the way. Do not use floor polish or wax that makes floors slippery. If you must use wax, use non-skid floor wax. Do not have throw rugs and other things on the floor that can make you trip. What can I do with my stairs? Do not leave  any items on the stairs. Make sure that there are handrails on both sides of the stairs and use them. Fix handrails that are broken or loose. Make sure that handrails are as long as the stairways. Check any carpeting to make sure that it is firmly attached to the stairs. Fix any carpet that is loose or worn. Avoid having throw rugs at the top or bottom of the stairs. If you do have throw rugs, attach them to the floor with carpet tape. Make sure that you have a light switch at the top of the stairs and the bottom of the stairs. If you do not have them, ask someone to add them for you. What else can I do to help prevent falls? Wear shoes that: Do not have high heels. Have rubber bottoms. Are comfortable and fit you  well. Are closed at the toe. Do not wear sandals. If you use a stepladder: Make sure that it is fully opened. Do not climb a closed stepladder. Make sure that both sides of the stepladder are locked into place. Ask someone to hold it for you, if possible. Clearly mark and make sure that you can see: Any grab bars or handrails. First and last steps. Where the edge of each step is. Use tools that help you move around (mobility aids) if they are needed. These include: Canes. Walkers. Scooters. Crutches. Turn on the lights when you go into a dark area. Replace any light bulbs as soon as they burn out. Set up your furniture so you have a clear path. Avoid moving your furniture around. If any of your floors are uneven, fix them. If there are any pets around you, be aware of where they are. Review your medicines with your doctor. Some medicines can make you feel dizzy. This can increase your chance of falling. Ask your doctor what other things that you can do to help prevent falls. This information is not intended to replace advice given to you by your health care provider. Make sure you discuss any questions you have with your health care provider. Document Released: 09/02/2009 Document Revised: 04/13/2016 Document Reviewed: 12/11/2014 Elsevier Interactive Patient Education  2017 [employer].

## 2021-09-29 ENCOUNTER — Other Ambulatory Visit: Payer: Self-pay

## 2021-09-29 ENCOUNTER — Ambulatory Visit
Admission: EM | Admit: 2021-09-29 | Discharge: 2021-09-29 | Disposition: A | Discharge status: Home / Self Care | Payer: Medicare Other | Attending: Family Medicine | Admitting: Family Medicine

## 2021-09-29 ENCOUNTER — Encounter: Payer: Self-pay | Admitting: Emergency Medicine

## 2021-09-29 ENCOUNTER — Ambulatory Visit: Admit: 2021-09-29 | Discharge status: Absent without leave | Payer: Medicare Other

## 2021-09-29 DIAGNOSIS — J069 Acute upper respiratory infection, unspecified: Secondary | ICD-10-CM | POA: Diagnosis not present

## 2021-09-29 MED ORDER — BENZONATATE 100 MG PO CAPS: ORAL_CAPSULE | ORAL | 0 refills | Status: DC

## 2021-09-29 MED ORDER — PREDNISONE 20 MG PO TABS: 40.0000 mg | ORAL_TABLET | Freq: Every day | ORAL | 0 refills | Status: DC

## 2021-09-29 NOTE — ED Triage Notes (Signed)
Patient c/o productive cough w/ "yellow" x 4 days.   Patient endorses chest congestion and bilateral ear pressure.   Patient took an at home COVID test yesterday with a negative result.   Patient has taken Mucinex with no relief of symptoms.

## 2021-09-29 NOTE — ED Provider Notes (Signed)
[facility]   [id number] 09/29/21 Arrival Time: 6440  ASSESSMENT & PLAN:  1. Viral URI with cough    Discussed typical duration of viral illnesses. Currently day #3-4. OTC symptom care as needed. Trial of: Meds ordered this encounter  Medications   predniSONE (DELTASONE) 20 MG tablet    Sig: Take 2 tablets (40 mg total) by mouth daily.    Dispense:  10 tablet    Refill:  0   benzonatate (TESSALON) 100 MG capsule    Sig: Take 1 capsule by mouth every 8 (eight) hours for cough.    Dispense:  21 capsule    Refill:  0     Follow-up Information     Lovena Le, Malena M, DO.   Specialty: Family Medicine Why: If worsening or failing to improve as anticipated. Contact information: [place] [address] [phone]                 Reviewed expectations re: course of current medical issues. Questions answered. Outlined signs and symptoms indicating need for more acute intervention. Understanding verbalized. After Visit Summary given.   SUBJECTIVE: History from: patient. Cameron Smith is a 80 y.o. male who reports: cough, chest congestion, ear pressure; abrupt onset; x 3-4 d; home COVID test negative. Mucinex without much relief. Denies: fever/SOB. Cough is worse today. Ques wheezing at times. Normal PO intake without n/v/d.  Social History   Tobacco Use  Smoking Status Former   Packs/day: 3.00   Years: 16.00   Pack years: 48.00   Types: Cigarettes   Quit date: 1982   Years since quitting: 40.8  Smokeless Tobacco Never     OBJECTIVE:  Vitals:   09/29/21 1023 09/29/21 1026  BP: (!) 169/73   Pulse: 75   Resp: 20   Temp: 98.4 F (36.9 C)   TempSrc: Oral   SpO2: 90% 91%    General appearance: alert; no distress Eyes: PERRLA; EOMI; conjunctiva normal HENT: [place]; AT; with nasal congestion Neck: supple  Lungs: speaks full sentences without difficulty; unlabored; clear Extremities: no edema Skin: warm and dry Neurologic: normal  gait Psychological: alert and cooperative; normal mood and affect    Allergies  Allergen Reactions   Flomax [Tamsulosin Hcl]     Dizzy     Past Medical History:  Diagnosis Date   Acute inflammation of nasal sinus    Chronic   Anxiety    Diverticulitis    Hypercholesteremia    Hypertension    Hypothyroidism    Nasal turbinate hypertrophy    Reactive airways dysfunction syndrome (HCC)    Social History   Socioeconomic History   Marital status: Married    Spouse name: Not on file   Number of children: Not on file   Years of education: Not on file   Highest education level: Not on file  Occupational History   Not on file  Tobacco Use   Smoking status: Former    Packs/day: 3.00    Years: 16.00    Pack years: 48.00    Types: Cigarettes    Quit date: 1982    Years since quitting: 40.8   Smokeless tobacco: Never  Vaping Use   Vaping Use: Never used  Substance and Sexual Activity   Alcohol use: No   Drug use: No   Sexual activity: Not on file  Other Topics Concern   Not on file  Social History Narrative   Married x 60 years. 2 children, 4 grands,  4 great grands   Social Determinants of Health   Financial Resource Strain: Low Risk    Difficulty of Paying Living Expenses: Not hard at all  Food Insecurity: No Food Insecurity   Worried About Charity fundraiser in the Last Year: Never true   Arboriculturist in the Last Year: Never true  Transportation Needs: No Transportation Needs   Lack of Transportation (Medical): No   Lack of Transportation (Non-Medical): No  Physical Activity: Insufficiently Active   Days of Exercise per Week: 3 days   Minutes of Exercise per Session: 30 min  Stress: No Stress Concern Present   Feeling of Stress : Not at all  Social Connections: Socially Integrated   Frequency of Communication with Friends and Family: More than three times a week   Frequency of Social Gatherings with Friends and Family: More than three times a week    Attends Religious Services: More than 4 times per year   Active Member of Clubs or Organizations: Yes   Attends Music therapist: More than 4 times per year   Marital Status: Married  Human resources officer Violence: Not At Risk   Fear of Current or Ex-Partner: No   Emotionally Abused: No   Physically Abused: No   Sexually Abused: No   Family History  Problem Relation Age of Onset   Heart disease Father    Diabetes Sister    Colon cancer Neg Hx    Past Surgical History:  Procedure Laterality Date   BACK SURGERY     sciatic   COLONOSCOPY  10/31/2011   Procedure: COLONOSCOPY;  Surgeon: Daneil Dolin, MD;  [facility];  Service: Endoscopy;  Laterality: N/A;  10:30 AM   NASAL SEPTOPLASTY W/ TURBINOPLASTY Bilateral 08/22/2019   Procedure: Sinus Endoscopy   Nasal Septoplasty with Bilateral TURBINATE REDUCTION, Bilateral Total Ethmoidectomy and Sphenoidotomy;  Surgeon: Melida Quitter, MD;  Location: [facility];  Service: ENT;  Laterality: Bilateral;   NASAL SINUS SURGERY       Vanessa Kick, MD 09/29/21 1143

## 2021-10-17 ENCOUNTER — Other Ambulatory Visit: Payer: Self-pay | Admitting: Family Medicine

## 2021-10-25 ENCOUNTER — Encounter: Payer: Self-pay | Admitting: *Deleted

## 2021-12-05 ENCOUNTER — Encounter: Payer: Self-pay | Admitting: Gastroenterology

## 2021-12-12 ENCOUNTER — Telehealth: Payer: Self-pay | Admitting: Family Medicine

## 2021-12-12 NOTE — Telephone Encounter (Signed)
Patient appointment on 1/26 and wanting labs done

## 2021-12-15 ENCOUNTER — Other Ambulatory Visit: Payer: Self-pay

## 2021-12-15 ENCOUNTER — Ambulatory Visit (INDEPENDENT_AMBULATORY_CARE_PROVIDER_SITE_OTHER): Payer: Medicare Other | Admitting: Family Medicine

## 2021-12-15 ENCOUNTER — Encounter: Payer: Self-pay | Admitting: Family Medicine

## 2021-12-15 VITALS — BP 142/84 | HR 72 | Temp 98.4°F | Wt 200.4 lb

## 2021-12-15 DIAGNOSIS — E119 Type 2 diabetes mellitus without complications: Secondary | ICD-10-CM | POA: Diagnosis not present

## 2021-12-15 DIAGNOSIS — Z130 Encounter for screening for diseases of the blood and blood-forming organs and certain disorders involving the immune mechanism: Secondary | ICD-10-CM

## 2021-12-15 DIAGNOSIS — E785 Hyperlipidemia, unspecified: Secondary | ICD-10-CM

## 2021-12-15 DIAGNOSIS — E039 Hypothyroidism, unspecified: Secondary | ICD-10-CM

## 2021-12-15 DIAGNOSIS — I10 Essential (primary) hypertension: Secondary | ICD-10-CM | POA: Diagnosis not present

## 2021-12-15 DIAGNOSIS — Z125 Encounter for screening for malignant neoplasm of prostate: Secondary | ICD-10-CM

## 2021-12-15 NOTE — Patient Instructions (Signed)
Labs today.  Continue your current medications.  Check BP as we discussed. If you have low readings please let me know.  Consider stopping aspirin.  Follow up in 6 months.

## 2021-12-15 NOTE — Assessment & Plan Note (Signed)
We discussed the proper way to take blood pressure at home.  Advised continued compliance with lisinopril and Norvasc.

## 2021-12-15 NOTE — Assessment & Plan Note (Signed)
Not at goal.  Lipid panel today.  Continue Lipitor.  May need dose increase.

## 2021-12-15 NOTE — Progress Notes (Signed)
Subjective:  Patient ID: Cameron Smith, male    DOB: 01-27-1941  Age: 81 y.o. MRN: [id number]  CC: Chief Complaint  Patient presents with   Establish Care    Pt states his blood pressure gets low at night (around 90/40)    HPI:   81 year old male with hypertension, hyperlipidemia, hypothyroidism presents for follow-up.  Patient states that he is feeling well.  Patient states that he is feeling well.  He states that he has had a few low blood pressure readings at night.  Has been as low as 90/40s.  However, he checks his blood pressure leaning back in a chair.  He endorses compliance with amlodipine and lisinopril.  Hyperlipidemia has not quite been at goal based off prior readings.  Needs labs.  Last LDL was 109.  He is currently taking Lipitor 40 mg daily.  No reported side effects.  Hypothyroidism has been stable on 100 MCG of Synthroid.  Needs TSH.  Patient Active Problem List   Diagnosis Date Noted   Type 2 diabetes, diet controlled ([place]) 12/15/2021   History of subdural hematoma (post traumatic) 04/17/2017   Essential hypertension 07/08/2013   Hyperlipidemia 07/08/2013   Hypothyroidism 07/08/2013    Social Hx   Social History   Socioeconomic History   Marital status: Married    Spouse name: Not on file   Number of children: Not on file   Years of education: Not on file   Highest education level: Not on file  Occupational History   Not on file  Tobacco Use   Smoking status: Former    Packs/day: 3.00    Years: 16.00    Pack years: 48.00    Types: Cigarettes    Quit date: 1982    Years since quitting: 41.0   Smokeless tobacco: Never  Vaping Use   Vaping Use: Never used  Substance and Sexual Activity   Alcohol use: No   Drug use: No   Sexual activity: Not on file  Other Topics Concern   Not on file  Social History Narrative   Married x 60 years. 2 children, 4 grands, 4 great grands   Social Determinants of Health   Financial Resource Strain: Low Risk     Difficulty of Paying Living Expenses: Not hard at all  Food Insecurity: No Food Insecurity   Worried About Charity fundraiser in the Last Year: Never true   Ran Out of Food in the Last Year: Never true  Transportation Needs: No Transportation Needs   Lack of Transportation (Medical): No   Lack of Transportation (Non-Medical): No  Physical Activity: Insufficiently Active   Days of Exercise per Week: 3 days   Minutes of Exercise per Session: 30 min  Stress: No Stress Concern Present   Feeling of Stress : Not at all  Social Connections: Socially Integrated   Frequency of Communication with Friends and Family: More than three times a week   Frequency of Social Gatherings with Friends and Family: More than three times a week   Attends Religious Services: More than 4 times per year   Active Member of [employer] or Organizations: Yes   Attends Music therapist: More than 4 times per year   Marital Status: Married    Review of Systems  Constitutional: Negative.   Respiratory: Negative.    Cardiovascular: Negative.    Objective:  BP (!) 142/84    Pulse 72    Temp 98.4 F (36.9 C)  Wt 200 lb 6.4 oz (90.9 kg)    SpO2 96%    BMI 30.92 kg/m   BP/Weight 12/15/2021 09/29/2021 0/45/9977  Systolic BP 414 239 532  Diastolic BP 84 73 68  Wt. (Lbs) 200.4 - 196.6  BMI 30.92 - 30.34    Physical Exam Vitals and nursing note reviewed.  Constitutional:      General: He is not in acute distress.    Appearance: Normal appearance. He is not ill-appearing.  HENT:     Head: Normocephalic and atraumatic.  Eyes:     General:        Right eye: No discharge.        Left eye: No discharge.     Conjunctiva/sclera: Conjunctivae normal.  Cardiovascular:     Rate and Rhythm: Normal rate and regular rhythm.     Heart sounds: No murmur heard. Pulmonary:     Effort: Pulmonary effort is normal.     Breath sounds: Normal breath sounds. No wheezing, rhonchi or rales.  Neurological:      Mental Status: He is alert.  Psychiatric:        Mood and Affect: Mood normal.        Behavior: Behavior normal.    Lab Results  Component Value Date   WBC 8.4 09/17/2020   HGB 15.1 09/17/2020   HCT 44.4 09/17/2020   PLT 200 09/17/2020   GLUCOSE 90 05/16/2021   CHOL 188 05/16/2021   TRIG 215 (H) 05/16/2021   HDL 42 05/16/2021   LDLCALC 109 (H) 05/16/2021   ALT 26 05/16/2021   AST 19 05/16/2021   NA 142 05/16/2021   K 5.1 05/16/2021   CL 102 05/16/2021   CREATININE 0.86 05/16/2021   BUN 14 05/16/2021   CO2 26 05/16/2021   TSH 0.843 05/16/2021   PSA 0.44 01/15/2015   INR 0.92 12/20/2009   HGBA1C 6.1 (H) 04/19/2021     Assessment & Plan:   Problem List Items Addressed This Visit       Cardiovascular and Mediastinum   Essential hypertension - Primary    We discussed the proper way to take blood pressure at home.  Advised continued compliance with lisinopril and Norvasc.      Relevant Orders   CMP14+EGFR     Endocrine   Hypothyroidism    TSH today.  Continue Synthroid.  Will adjust doses if needed based off of lab reading.      Relevant Orders   TSH   Type 2 diabetes, diet controlled ([facility])   Relevant Orders   Hemoglobin A1c     Other   Hyperlipidemia    Not at goal.  Lipid panel today.  Continue Lipitor.  May need dose increase.      Relevant Orders   Lipid panel   Other Visit Diagnoses     Screening for prostate cancer       Relevant Orders   PSA   Screening for deficiency anemia       Relevant Orders   CBC      Follow-up:  Return in about 6 months (around 06/14/2022).  [place]

## 2021-12-15 NOTE — Assessment & Plan Note (Signed)
TSH today.  Continue Synthroid.  Will adjust doses if needed based off of lab reading.

## 2021-12-16 LAB — HEMOGLOBIN A1C
Est. average glucose Bld gHb Est-mCnc: 146 mg/dL
Hgb A1c MFr Bld: 6.7 % — ABNORMAL HIGH (ref 4.8–5.6)

## 2021-12-16 LAB — CBC
Hematocrit: 45.2 % (ref 37.5–51.0)
Hemoglobin: 15.6 g/dL (ref 13.0–17.7)
MCH: 32.4 pg (ref 26.6–33.0)
MCHC: 34.5 g/dL (ref 31.5–35.7)
MCV: 94 fL (ref 79–97)
Platelets: 240 10*3/uL (ref 150–450)
RBC: 4.82 x10E6/uL (ref 4.14–5.80)
RDW: 12.2 % (ref 11.6–15.4)
WBC: 7.3 10*3/uL (ref 3.4–10.8)

## 2021-12-16 LAB — CMP14+EGFR
ALT: 28 IU/L (ref 0–44)
AST: 20 IU/L (ref 0–40)
Albumin/Globulin Ratio: 2.7 — ABNORMAL HIGH (ref 1.2–2.2)
Albumin: 4.8 g/dL — ABNORMAL HIGH (ref 3.7–4.7)
Alkaline Phosphatase: 87 IU/L (ref 44–121)
BUN/Creatinine Ratio: 22 (ref 10–24)
BUN: 17 mg/dL (ref 8–27)
Bilirubin Total: 0.5 mg/dL (ref 0.0–1.2)
CO2: 26 mmol/L (ref 20–29)
Calcium: 10 mg/dL (ref 8.6–10.2)
Chloride: 99 mmol/L (ref 96–106)
Creatinine, Ser: 0.79 mg/dL (ref 0.76–1.27)
Globulin, Total: 1.8 g/dL (ref 1.5–4.5)
Glucose: 175 mg/dL — ABNORMAL HIGH (ref 70–99)
Potassium: 4.9 mmol/L (ref 3.5–5.2)
Sodium: 139 mmol/L (ref 134–144)
Total Protein: 6.6 g/dL (ref 6.0–8.5)
eGFR: 90 mL/min/{1.73_m2} (ref >59)

## 2021-12-16 LAB — LIPID PANEL
Chol/HDL Ratio: 5 ratio (ref 0.0–5.0)
Cholesterol, Total: 204 mg/dL — ABNORMAL HIGH (ref 100–199)
HDL: 41 mg/dL (ref >39)
LDL Chol Calc (NIH): 111 mg/dL — ABNORMAL HIGH (ref 0–99)
Triglycerides: 303 mg/dL — ABNORMAL HIGH (ref 0–149)
VLDL Cholesterol Cal: 52 mg/dL — ABNORMAL HIGH (ref 5–40)

## 2021-12-16 LAB — PSA: Prostate Specific Ag, Serum: 0.5 ng/mL (ref 0.0–4.0)

## 2021-12-16 LAB — TSH: TSH: 3.62 u[IU]/mL (ref 0.450–4.500)

## 2021-12-20 MED ORDER — ATORVASTATIN CALCIUM 80 MG PO TABS: 80.0000 mg | ORAL_TABLET | Freq: Every day | ORAL | 0 refills | Status: DC

## 2021-12-20 NOTE — Addendum Note (Signed)
Addended by: Dairl Ponder on: 12/20/2021 09:07 AM   Modules accepted: Orders

## 2021-12-30 ENCOUNTER — Other Ambulatory Visit: Payer: Self-pay | Admitting: Physician Assistant

## 2022-01-17 ENCOUNTER — Other Ambulatory Visit: Payer: Self-pay | Admitting: Family Medicine

## 2022-02-18 ENCOUNTER — Other Ambulatory Visit: Payer: Self-pay | Admitting: Family Medicine

## 2022-03-29 ENCOUNTER — Telehealth: Payer: Self-pay

## 2022-03-29 ENCOUNTER — Encounter: Payer: Self-pay | Admitting: Gastroenterology

## 2022-03-29 ENCOUNTER — Ambulatory Visit (INDEPENDENT_AMBULATORY_CARE_PROVIDER_SITE_OTHER): Payer: Medicare Other | Admitting: Gastroenterology

## 2022-03-29 ENCOUNTER — Other Ambulatory Visit: Payer: Self-pay

## 2022-03-29 DIAGNOSIS — Z1211 Encounter for screening for malignant neoplasm of colon: Secondary | ICD-10-CM | POA: Insufficient documentation

## 2022-03-29 MED ORDER — CLENPIQ 10-3.5-12 MG-GM -GM/175ML PO SOLN: 1.0000 | ORAL | 0 refills | Status: DC

## 2022-03-29 NOTE — Patient Instructions (Signed)
We are arranging a colonoscopy with Dr. Rourk in the near future.  Further recommendations to follow!  It was a pleasure to see you today. I want to create trusting relationships with patients to provide genuine, compassionate, and quality care. I value your feedback. If you receive a survey regarding your visit,  I greatly appreciate you taking time to fill this out.   Blaize Epple W. Jolyne Laye, PhD, ANP-BC [facility]   

## 2022-03-29 NOTE — H&P (View-Only) (Signed)
Gastroenterology Office Note    Referring Provider: Erven Colla, DO Primary Care Physician:  Coral Spikes, DO  Primary GI: Dr. Gala Romney    Chief Complaint   Chief Complaint  Patient presents with   Follow-up    10 year colonoscopy     History of Present Illness   Cameron Smith is an 81 y.o. male presenting today to discuss routine screening colonoscopy. Last was in 2012 with adequate prep, extensive left-sided diverticula, colon otherwise normal.    No abdominal pain, N/V, changes in bowel habits, constipation, diarrhea, overt GI bleeding, GERD, dysphagia, unexplained weight loss, lack of appetite, unexplained weight gain.   No FH of colorectal cancer or polyps.       Past Medical History:  Diagnosis Date   Acute inflammation of nasal sinus    Chronic   Anxiety    Diverticulitis    Hypercholesteremia    Hypertension    Hypothyroidism    Nasal turbinate hypertrophy    Reactive airways dysfunction syndrome [facility])     Past Surgical History:  Procedure Laterality Date   BACK SURGERY     sciatic   COLONOSCOPY  10/31/2011   Adequate prep, extensive left-sided diverticula, colon otherwise normal.   NASAL SEPTOPLASTY W/ TURBINOPLASTY Bilateral 08/22/2019   Procedure: Sinus Endoscopy   Nasal Septoplasty with Bilateral TURBINATE REDUCTION, Bilateral Total Ethmoidectomy and Sphenoidotomy;  Surgeon: Melida Quitter, MD;  Location: [place];  Service: ENT;  Laterality: Bilateral;   NASAL SINUS SURGERY      Current Outpatient Medications  Medication Sig Dispense Refill   amLODipine (NORVASC) 5 MG tablet TAKE 1 TABLET BY MOUTH DAILY 90 tablet 1   aspirin EC 81 MG tablet Take 81 mg by mouth every evening.     atorvastatin (LIPITOR) 80 MG tablet TAKE 1 TABLET BY MOUTH DAILY 90 tablet 1   Coenzyme Q10 200 MG capsule Take 200 mg by mouth every evening.     levothyroxine (SYNTHROID) 100 MCG tablet TAKE 1 TABLET BY MOUTH DAILY 90 tablet 1   lisinopril (ZESTRIL) 40 MG  tablet TAKE 1 TABLET BY MOUTH DAILY 90 tablet 1   vitamin E 400 UNIT capsule Take 400 Units by mouth every evening.      zinc gluconate 50 MG tablet Take 50 mg by mouth daily.     mometasone (ELOCON) 0.1 % cream Apply QHS to ears prn itch (Patient not taking: Reported on 03/29/2022) 45 g 0   No current facility-administered medications for this visit.    Allergies as of 03/29/2022 - Review Complete 03/29/2022  Allergen Reaction Noted   Flomax [tamsulosin hcl]  06/14/2013    Family History  Problem Relation Age of Onset   Heart disease Father    Diabetes Sister    Colon cancer Neg Hx     Social History   Socioeconomic History   Marital status: Married    Spouse name: Not on file   Number of children: Not on file   Years of education: Not on file   Highest education level: Not on file  Occupational History   Not on file  Tobacco Use   Smoking status: Former    Packs/day: 3.00    Years: 16.00    Pack years: 48.00    Types: Cigarettes    Quit date: 1982    Years since quitting: 41.3   Smokeless tobacco: Never  Vaping Use   Vaping Use: Never used  Substance and  Sexual Activity   Alcohol use: No   Drug use: No   Sexual activity: Not on file  Other Topics Concern   Not on file  Social History Narrative   Married x 60 years. 2 children, 4 grands, 4 great grands   Social Determinants of Health   Financial Resource Strain: Low Risk    Difficulty of Paying Living Expenses: Not hard at all  Food Insecurity: No Food Insecurity   Worried About Charity fundraiser in the Last Year: Never true   Ran Out of Food in the Last Year: Never true  Transportation Needs: No Transportation Needs   Lack of Transportation (Medical): No   Lack of Transportation (Non-Medical): No  Physical Activity: Insufficiently Active   Days of Exercise per Week: 3 days   Minutes of Exercise per Session: 30 min  Stress: No Stress Concern Present   Feeling of Stress : Not at all  Social  Connections: Socially Integrated   Frequency of Communication with Friends and Family: More than three times a week   Frequency of Social Gatherings with Friends and Family: More than three times a week   Attends Religious Services: More than 4 times per year   Active Member of [employer] or Organizations: Yes   Attends Music therapist: More than 4 times per year   Marital Status: Married  Human resources officer Violence: Not At Risk   Fear of Current or Ex-Partner: No   Emotionally Abused: No   Physically Abused: No   Sexually Abused: No     Review of Systems   Gen: Denies any fever, chills, fatigue, weight loss, lack of appetite.  CV: Denies chest pain, heart palpitations, peripheral edema, syncope.  Resp: Denies shortness of breath at rest or with exertion. Denies wheezing or cough.  GI: see HPI GU : Denies urinary burning, urinary frequency, urinary hesitancy MS: Denies joint pain, muscle weakness, cramps, or limitation of movement.  Derm: Denies rash, itching, dry skin Psych: Denies depression, anxiety, memory loss, and confusion Heme: Denies bruising, bleeding, and enlarged lymph nodes.   Physical Exam   BP 130/60   Pulse 72   Temp (!) 97.5 F (36.4 C)   Ht '5\' 8"'$  (1.727 m)   Wt 202 lb 9.6 oz (91.9 kg)   BMI 30.81 kg/m  General:   Alert and oriented. Pleasant and cooperative. Well-nourished and well-developed.  Head:  Normocephalic and atraumatic. Eyes:  Without icterus Ears:  Normal auditory acuity. Lungs:  Clear to auscultation bilaterally.  Heart:  S1, S2 present without murmurs appreciated.  Abdomen:  +BS, soft, non-tender and non-distended. No HSM noted. No guarding or rebound. No masses appreciated.  Rectal:  Deferred  Msk:  Symmetrical without gross deformities. Normal posture. Extremities:  Without edema. Neurologic:  Alert and  oriented x4;  grossly normal neurologically. Skin:  Intact without significant lesions or rashes. Psych:  Alert and  cooperative. Normal mood and affect.   Assessment   Cameron Smith is an 81 y.o. male presenting today to discuss routine screening colonoscopy. Last was in 2012 with adequate prep, extensive left-sided diverticula, colon otherwise normal.   He has no complaints today whatsoever. He is in excellent health and desires to pursue one more screening colonoscopy at this time. No family history of colorectal cancer or polyps.     PLAN   Proceed with colonoscopy by Dr. Gala Romney in near future: the risks, benefits, and alternatives have been discussed with the patient in detail. The  patient states understanding and desires to proceed.  Further recommendations to follow   Annitta Needs, PhD, ANP-BC [facility]

## 2022-03-29 NOTE — Telephone Encounter (Signed)
PA for TCS submitted via [facility] website. PA# [id number], valid 04/19/22-07/18/22. ?

## 2022-03-29 NOTE — Progress Notes (Signed)
? ? ? ? ? ?Gastroenterology Office Note   ? ?Referring Provider: Erven Colla, DO ?Primary Care Physician:  Coral Spikes, DO  ?Primary GI: Dr. Gala Romney  ? ? ?Chief Complaint  ? ?Chief Complaint  ?Patient presents with  ? Follow-up  ?  10 year colonoscopy  ? ? ? ?History of Present Illness  ? ?Cameron Smith is an 81 y.o. male presenting today to discuss routine screening colonoscopy. Last was in 2012 with adequate prep, extensive left-sided diverticula, colon otherwise normal.  ? ? ?No abdominal pain, N/V, changes in bowel habits, constipation, diarrhea, overt GI bleeding, GERD, dysphagia, unexplained weight loss, lack of appetite, unexplained weight gain.  ? ?No FH of colorectal cancer or polyps.  ? ? ? ? ? ?Past Medical History:  ?Diagnosis Date  ? Acute inflammation of nasal sinus   ? Chronic  ? Anxiety   ? Diverticulitis   ? Hypercholesteremia   ? Hypertension   ? Hypothyroidism   ? Nasal turbinate hypertrophy   ? Reactive airways dysfunction syndrome (HCC)   ? ? ?Past Surgical History:  ?Procedure Laterality Date  ? BACK SURGERY    ? sciatic  ? COLONOSCOPY  10/31/2011  ? Adequate prep, extensive left-sided diverticula, colon otherwise normal.  ? NASAL SEPTOPLASTY W/ TURBINOPLASTY Bilateral 08/22/2019  ? Procedure: Sinus Endoscopy   Nasal Septoplasty with Bilateral TURBINATE REDUCTION, Bilateral Total Ethmoidectomy and Sphenoidotomy;  Surgeon: Melida Quitter, MD;  Location: [place];  Service: ENT;  Laterality: Bilateral;  ? NASAL SINUS SURGERY    ? ? ?Current Outpatient Medications  ?Medication Sig Dispense Refill  ? amLODipine (NORVASC) 5 MG tablet TAKE 1 TABLET BY MOUTH DAILY 90 tablet 1  ? aspirin EC 81 MG tablet Take 81 mg by mouth every evening.    ? atorvastatin (LIPITOR) 80 MG tablet TAKE 1 TABLET BY MOUTH DAILY 90 tablet 1  ? Coenzyme Q10 200 MG capsule Take 200 mg by mouth every evening.    ? levothyroxine (SYNTHROID) 100 MCG tablet TAKE 1 TABLET BY MOUTH DAILY 90 tablet 1  ? lisinopril (ZESTRIL) 40 MG  tablet TAKE 1 TABLET BY MOUTH DAILY 90 tablet 1  ? vitamin E 400 UNIT capsule Take 400 Units by mouth every evening.     ? zinc gluconate 50 MG tablet Take 50 mg by mouth daily.    ? mometasone (ELOCON) 0.1 % cream Apply QHS to ears prn itch (Patient not taking: Reported on 03/29/2022) 45 g 0  ? ?No current facility-administered medications for this visit.  ? ? ?Allergies as of 03/29/2022 - Review Complete 03/29/2022  ?Allergen Reaction Noted  ? Flomax [tamsulosin hcl]  06/14/2013  ? ? ?Family History  ?Problem Relation Age of Onset  ? Heart disease Father   ? Diabetes Sister   ? Colon cancer Neg Hx   ? ? ?Social History  ? ?Socioeconomic History  ? Marital status: Married  ?  Spouse name: Not on file  ? Number of children: Not on file  ? Years of education: Not on file  ? Highest education level: Not on file  ?Occupational History  ? Not on file  ?Tobacco Use  ? Smoking status: Former  ?  Packs/day: 3.00  ?  Years: 16.00  ?  Pack years: 48.00  ?  Types: Cigarettes  ?  Quit date: 81  ?  Years since quitting: 41.3  ? Smokeless tobacco: Never  ?Vaping Use  ? Vaping Use: Never used  ?Substance and  Sexual Activity  ? Alcohol use: No  ? Drug use: No  ? Sexual activity: Not on file  ?Other Topics Concern  ? Not on file  ?Social History Narrative  ? Married x 60 years. 2 children, 4 grands, 4 great grands  ? ?Social Determinants of Health  ? ?Financial Resource Strain: Low Risk   ? Difficulty of Paying Living Expenses: Not hard at all  ?Food Insecurity: No Food Insecurity  ? Worried About Charity fundraiser in the Last Year: Never true  ? Ran Out of Food in the Last Year: Never true  ?Transportation Needs: No Transportation Needs  ? Lack of Transportation (Medical): No  ? Lack of Transportation (Non-Medical): No  ?Physical Activity: Insufficiently Active  ? Days of Exercise per Week: 3 days  ? Minutes of Exercise per Session: 30 min  ?Stress: No Stress Concern Present  ? Feeling of Stress : Not at all  ?Social  Connections: Socially Integrated  ? Frequency of Communication with Friends and Family: More than three times a week  ? Frequency of Social Gatherings with Friends and Family: More than three times a week  ? Attends Religious Services: More than 4 times per year  ? Active Member of Clubs or Organizations: Yes  ? Attends Archivist Meetings: More than 4 times per year  ? Marital Status: Married  ?Intimate Partner Violence: Not At Risk  ? Fear of Current or Ex-Partner: No  ? Emotionally Abused: No  ? Physically Abused: No  ? Sexually Abused: No  ? ? ? ?Review of Systems  ? ?Gen: Denies any fever, chills, fatigue, weight loss, lack of appetite.  ?CV: Denies chest pain, heart palpitations, peripheral edema, syncope.  ?Resp: Denies shortness of breath at rest or with exertion. Denies wheezing or cough.  ?GI: see HPI ?GU : Denies urinary burning, urinary frequency, urinary hesitancy ?MS: Denies joint pain, muscle weakness, cramps, or limitation of movement.  ?Derm: Denies rash, itching, dry skin ?Psych: Denies depression, anxiety, memory loss, and confusion ?Heme: Denies bruising, bleeding, and enlarged lymph nodes. ? ? ?Physical Exam  ? ?BP 130/60   Pulse 72   Temp (!) 97.5 ?F (36.4 ?C)   Ht '5\' 8"'$  (1.727 m)   Wt 202 lb 9.6 oz (91.9 kg)   BMI 30.81 kg/m?  ?General:   Alert and oriented. Pleasant and cooperative. Well-nourished and well-developed.  ?Head:  Normocephalic and atraumatic. ?Eyes:  Without icterus ?Ears:  Normal auditory acuity. ?Lungs:  Clear to auscultation bilaterally.  ?Heart:  S1, S2 present without murmurs appreciated.  ?Abdomen:  +BS, soft, non-tender and non-distended. No HSM noted. No guarding or rebound. No masses appreciated.  ?Rectal:  Deferred  ?Msk:  Symmetrical without gross deformities. Normal posture. ?Extremities:  Without edema. ?Neurologic:  Alert and  oriented x4;  grossly normal neurologically. ?Skin:  Intact without significant lesions or rashes. ?Psych:  Alert and  cooperative. Normal mood and affect. ? ? ?Assessment  ? ?Cameron Smith is an 81 y.o. male presenting today to discuss routine screening colonoscopy. Last was in 2012 with adequate prep, extensive left-sided diverticula, colon otherwise normal.  ? ?He has no complaints today whatsoever. He is in excellent health and desires to pursue one more screening colonoscopy at this time. No family history of colorectal cancer or polyps.  ? ? ? ?PLAN  ? ?Proceed with colonoscopy by Dr. Gala Romney in near future: the risks, benefits, and alternatives have been discussed with the patient in detail. The  patient states understanding and desires to proceed. ? ?Further recommendations to follow  ? ?Annitta Needs, PhD, ANP-BC ?[facility]  ? ? ?

## 2022-04-19 ENCOUNTER — Ambulatory Visit (HOSPITAL_BASED_OUTPATIENT_CLINIC_OR_DEPARTMENT_OTHER): Payer: Medicare Other | Admitting: Certified Registered Nurse Anesthetist

## 2022-04-19 ENCOUNTER — Ambulatory Visit (HOSPITAL_COMMUNITY)
Admission: RE | Admit: 2022-04-19 | Discharge: 2022-04-19 | Disposition: A | Discharge status: Home / Self Care | Payer: Medicare Other | Attending: Internal Medicine | Admitting: Internal Medicine

## 2022-04-19 ENCOUNTER — Encounter (HOSPITAL_COMMUNITY): Payer: Self-pay | Admitting: Internal Medicine

## 2022-04-19 ENCOUNTER — Ambulatory Visit (HOSPITAL_COMMUNITY): Payer: Medicare Other | Admitting: Certified Registered Nurse Anesthetist

## 2022-04-19 ENCOUNTER — Encounter (HOSPITAL_COMMUNITY)
Admission: RE | Disposition: A | Discharge status: Home / Self Care | Payer: Self-pay | Source: Home / Self Care | Attending: Internal Medicine

## 2022-04-19 ENCOUNTER — Other Ambulatory Visit: Payer: Self-pay

## 2022-04-19 DIAGNOSIS — E039 Hypothyroidism, unspecified: Secondary | ICD-10-CM | POA: Diagnosis not present

## 2022-04-19 DIAGNOSIS — E119 Type 2 diabetes mellitus without complications: Secondary | ICD-10-CM | POA: Diagnosis not present

## 2022-04-19 DIAGNOSIS — Z87891 Personal history of nicotine dependence: Secondary | ICD-10-CM | POA: Diagnosis not present

## 2022-04-19 DIAGNOSIS — K5730 Diverticulosis of large intestine without perforation or abscess without bleeding: Secondary | ICD-10-CM | POA: Diagnosis not present

## 2022-04-19 DIAGNOSIS — I10 Essential (primary) hypertension: Secondary | ICD-10-CM | POA: Insufficient documentation

## 2022-04-19 DIAGNOSIS — K640 First degree hemorrhoids: Secondary | ICD-10-CM | POA: Insufficient documentation

## 2022-04-19 DIAGNOSIS — F419 Anxiety disorder, unspecified: Secondary | ICD-10-CM | POA: Diagnosis not present

## 2022-04-19 DIAGNOSIS — K644 Residual hemorrhoidal skin tags: Secondary | ICD-10-CM | POA: Diagnosis not present

## 2022-04-19 DIAGNOSIS — Z79899 Other long term (current) drug therapy: Secondary | ICD-10-CM | POA: Insufficient documentation

## 2022-04-19 DIAGNOSIS — Z79890 Hormone replacement therapy (postmenopausal): Secondary | ICD-10-CM | POA: Insufficient documentation

## 2022-04-19 DIAGNOSIS — Z1211 Encounter for screening for malignant neoplasm of colon: Secondary | ICD-10-CM | POA: Diagnosis not present

## 2022-04-19 HISTORY — PX: COLONOSCOPY WITH PROPOFOL: SHX5780

## 2022-04-19 SURGERY — COLONOSCOPY WITH PROPOFOL
Anesthesia: General

## 2022-04-19 MED ORDER — PROPOFOL 500 MG/50ML IV EMUL
INTRAVENOUS | Status: DC | PRN
  Administered 2022-04-19: 125 ug/kg/min via INTRAVENOUS

## 2022-04-19 MED ORDER — LACTATED RINGERS IV SOLN: INTRAVENOUS | Status: DC

## 2022-04-19 MED ORDER — PROPOFOL 10 MG/ML IV BOLUS
INTRAVENOUS | Status: DC | PRN
  Administered 2022-04-19: 60 mg via INTRAVENOUS

## 2022-04-19 MED ORDER — PROPOFOL 500 MG/50ML IV EMUL
INTRAVENOUS | Status: AC
  Filled 2022-04-19: qty 50

## 2022-04-19 MED ORDER — LACTATED RINGERS IV SOLN: INTRAVENOUS | Status: DC | PRN

## 2022-04-19 NOTE — Discharge Instructions (Signed)
  Colonoscopy Discharge Instructions  Read the instructions outlined below and refer to this sheet in the next few weeks. These discharge instructions provide you with general information on caring for yourself after you leave the hospital. Your doctor may also give you specific instructions. While your treatment has been planned according to the most current medical practices available, unavoidable complications occasionally occur. If you have any problems or questions after discharge, call Dr. Gala Romney at [phone]. ACTIVITY You may resume your regular activity, but move at a slower pace for the next 24 hours.  Take frequent rest periods for the next 24 hours.  Walking will help get rid of the air and reduce the bloated feeling in your belly (abdomen).  No driving for 24 hours (because of the medicine (anesthesia) used during the test).   Do not sign any important legal documents or operate any machinery for 24 hours (because of the anesthesia used during the test).  NUTRITION Drink plenty of fluids.  You may resume your normal diet as instructed by your doctor.  Begin with a light meal and progress to your normal diet. Heavy or fried foods are harder to digest and may make you feel sick to your stomach (nauseated).  Avoid alcoholic beverages for 24 hours or as instructed.  MEDICATIONS You may resume your normal medications unless your doctor tells you otherwise.  WHAT YOU CAN EXPECT TODAY Some feelings of bloating in the abdomen.  Passage of more gas than usual.  Spotting of blood in your stool or on the toilet paper.  IF YOU HAD POLYPS REMOVED DURING THE COLONOSCOPY: No aspirin products for 7 days or as instructed.  No alcohol for 7 days or as instructed.  Eat a soft diet for the next 24 hours.  FINDING OUT THE RESULTS OF YOUR TEST Not all test results are available during your visit. If your test results are not back during the visit, make an appointment with your caregiver to find out the  results. Do not assume everything is normal if you have not heard from your caregiver or the medical facility. It is important for you to follow up on all of your test results.  SEEK IMMEDIATE MEDICAL ATTENTION IF: You have more than a spotting of blood in your stool.  Your belly is swollen (abdominal distention).  You are nauseated or vomiting.  You have a temperature over 101.  You have abdominal pain or discomfort that is severe or gets worse throughout the day.    No polyps found today.  You do have diverticulosis  Diverticulosis information provided  A future colonoscopy is not recommended unless new symptoms develop  At patient request, I called Pat Patrick at [phone] findings and recommendations

## 2022-04-19 NOTE — Op Note (Signed)
[facility] Patient Name: Cameron Smith Procedure Date: 04/19/2022 11:09 AM MRN: [id number] Date of Birth: 10-Mar-1941 Attending MD: Norvel Richards , MD CSN: [id number] Age: 81 Admit Type: Outpatient Procedure:                Colonoscopy Indications:              Screening for colorectal malignant neoplasm Providers:                Norvel Richards, MD, Caprice Kluver, Kristine L.                            Risa Grill, Technician Referring MD:             Janine Ores. Rauck Medicines:                Propofol per Anesthesia Complications:            No immediate complications. Estimated Blood Loss:     Estimated blood loss: none. Procedure:                Pre-Anesthesia Assessment:                           - Prior to the procedure, a History and Physical                            was performed, and patient medications and                            allergies were reviewed. The patient's tolerance of                            previous anesthesia was also reviewed. The risks                            and benefits of the procedure and the sedation                            options and risks were discussed with the patient.                            All questions were answered, and informed consent                            was obtained. Prior Anticoagulants: The patient has                            taken no previous anticoagulant or antiplatelet                            agents. ASA Grade Assessment: II - A patient with                            mild systemic disease. After reviewing the risks  and benefits, the patient was deemed in                            satisfactory condition to undergo the procedure.                           After obtaining informed consent, the colonoscope                            was passed under direct vision. Throughout the                            procedure, the patient's blood pressure, pulse, and                             oxygen saturations were monitored continuously. The                            [id number]) scope was introduced through the                            anus and advanced to the the cecum, identified by                            appendiceal orifice and ileocecal valve. The                            colonoscopy was performed without difficulty. The                            patient tolerated the procedure well. The quality                            of the bowel preparation was adequate. Scope In: 11:26:56 AM Scope Out: 11:37:40 AM Scope Withdrawal Time: 0 hours 7 minutes 23 seconds  Total Procedure Duration: 0 hours 10 minutes 44 seconds  Findings:      The perianal and digital rectal examinations were normal except for a       single perianal skin tag.      Many large-mouthed diverticula were found in the entire colon.      Non-bleeding internal hemorrhoids were found during retroflexion. The       hemorrhoids were moderate, medium-sized and Grade I (internal       hemorrhoids that do not prolapse).      The exam was otherwise without abnormality on direct and retroflexion       views. Impression:               - Diverticulosis in the entire examined colon.                           - Non-bleeding internal hemorrhoids.                           - The examination was otherwise normal on direct  and retroflexion views.                           - No specimens collected. Moderate Sedation:      Moderate (conscious) sedation was personally administered by an       anesthesia professional. The following parameters were monitored: oxygen       saturation, heart rate, blood pressure, respiratory rate, EKG, adequacy       of pulmonary ventilation, and response to care. Recommendation:           - Patient has a contact number available for                            emergencies. The signs and symptoms of potential                             delayed complications were discussed with the                            patient. Return to normal activities tomorrow.                            Written discharge instructions were provided to the                            patient.                           - Resume previous diet.                           - Continue present medications.                           - No repeat colonoscopy due to age.                           - Return to GI clinic PRN. Procedure Code(s):        --- Professional ---                           [phone], Colonoscopy, flexible; diagnostic, including                            collection of specimen(s) by brushing or washing,                            when performed (separate procedure) Diagnosis Code(s):        --- Professional ---                           Z12.11, Encounter for screening for malignant                            neoplasm of colon  K64.0, First degree hemorrhoids                           K57.30, Diverticulosis of large intestine without                            perforation or abscess without bleeding CPT copyright 2019 American Medical Association. All rights reserved. The codes documented in this report are preliminary and upon coder review may  be revised to meet current compliance requirements. Cristopher Estimable. Elyn Krogh, MD Norvel Richards, MD 04/19/2022 11:46:18 AM This report has been signed electronically. Number of Addenda: 0

## 2022-04-19 NOTE — Interval H&P Note (Signed)
History and Physical Interval Note:  04/19/2022 11:12 AM  Cameron Smith  has presented today for surgery, with the diagnosis of screening colonoscopy.  The various methods of treatment have been discussed with the patient and family. After consideration of risks, benefits and other options for treatment, the patient has consented to  Procedure(s) with comments: COLONOSCOPY WITH PROPOFOL (N/A) - 11:15am as a surgical intervention.  The patient's history has been reviewed, patient examined, no change in status, stable for surgery.  I have reviewed the patient's chart and labs.  Questions were answered to the patient's satisfaction.     Cameron Smith  No change.  Patient is here for 1 more screening colonoscopy per plan. The risks, benefits, limitations, alternatives and imponderables have been reviewed with the patient. Questions have been answered. All parties are agreeable.

## 2022-04-19 NOTE — Transfer of Care (Signed)
Immediate Anesthesia Transfer of Care Note  Patient: Cameron Smith  Procedure(s) Performed: COLONOSCOPY WITH PROPOFOL  Patient Location: PACU  Anesthesia Type:General  Level of Consciousness: awake, alert  and oriented  Airway & Oxygen Therapy: Patient Spontanous Breathing  Post-op Assessment: Report given to RN, Post -op Vital signs reviewed and stable, Patient moving all extremities X 4 and Patient able to stick tongue midline  Post vital signs: Reviewed  Last Vitals:  Vitals Value Taken Time  BP 100/48   Temp 97.9   Pulse 75 04/19/22 1142  Resp 18 04/19/22 1142  SpO2 95     Last Pain:  Vitals:   04/19/22 1120  TempSrc:   PainSc: 0-No pain      Patients Stated Pain Goal: 7 (42/[id number]/37 [id number])  Complications: No notable events documented.

## 2022-04-19 NOTE — Anesthesia Postprocedure Evaluation (Signed)
Anesthesia Post Note  Patient: Cameron Smith  Procedure(s) Performed: COLONOSCOPY WITH PROPOFOL  Patient location during evaluation: Phase II Anesthesia Type: General Level of consciousness: awake Pain management: pain level controlled Vital Signs Assessment: post-procedure vital signs reviewed and stable Respiratory status: spontaneous breathing and respiratory function stable Cardiovascular status: blood pressure returned to baseline and stable Postop Assessment: no headache and no apparent nausea or vomiting Anesthetic complications: no Comments: Late entry   No notable events documented.   Last Vitals:  Vitals:   04/19/22 1002 04/19/22 1142  BP: (!) 166/76 100/66  Pulse: 79 75  Resp: 15 18  Temp: 36.6 C   SpO2: 93% 97%    Last Pain:  Vitals:   04/19/22 1142  TempSrc:   PainSc: 0-No pain                 Louann Sjogren

## 2022-04-19 NOTE — Anesthesia Preprocedure Evaluation (Signed)
Anesthesia Evaluation  Patient identified by MRN, date of birth, ID band Patient awake    Reviewed: Allergy & Precautions, H&P , NPO status , Patient's Chart, lab work & pertinent test results, reviewed documented beta blocker date and time   Airway Mallampati: II  TM Distance: >3 FB Neck ROM: full    Dental no notable dental hx.    Pulmonary neg pulmonary ROS, former smoker,    Pulmonary exam normal breath sounds clear to auscultation       Cardiovascular Exercise Tolerance: Good hypertension, negative cardio ROS   Rhythm:regular Rate:Normal     Neuro/Psych Anxiety negative neurological ROS  negative psych ROS   GI/Hepatic negative GI ROS, Neg liver ROS,   Endo/Other  diabetes, Type 2Hypothyroidism   Renal/GU negative Renal ROS  negative genitourinary   Musculoskeletal   Abdominal   Peds  Hematology negative hematology ROS (+)   Anesthesia Other Findings   Reproductive/Obstetrics negative OB ROS                             Anesthesia Physical Anesthesia Plan  ASA: 2  Anesthesia Plan: General   Post-op Pain Management:    Induction:   PONV Risk Score and Plan: Propofol infusion  Airway Management Planned:   Additional Equipment:   Intra-op Plan:   Post-operative Plan:   Informed Consent: I have reviewed the patients History and Physical, chart, labs and discussed the procedure including the risks, benefits and alternatives for the proposed anesthesia with the patient or authorized representative who has indicated his/her understanding and acceptance.     Dental Advisory Given  Plan Discussed with: CRNA  Anesthesia Plan Comments:         Anesthesia Quick Evaluation

## 2022-04-26 ENCOUNTER — Encounter (HOSPITAL_COMMUNITY): Payer: Self-pay | Admitting: Internal Medicine

## 2022-05-05 ENCOUNTER — Other Ambulatory Visit: Payer: Self-pay | Admitting: Family Medicine

## 2022-05-06 ENCOUNTER — Other Ambulatory Visit: Payer: Self-pay | Admitting: Family Medicine

## 2022-06-09 ENCOUNTER — Telehealth: Payer: Self-pay

## 2022-06-09 DIAGNOSIS — I10 Essential (primary) hypertension: Secondary | ICD-10-CM

## 2022-06-09 DIAGNOSIS — E785 Hyperlipidemia, unspecified: Secondary | ICD-10-CM

## 2022-06-09 DIAGNOSIS — E039 Hypothyroidism, unspecified: Secondary | ICD-10-CM

## 2022-06-09 DIAGNOSIS — E119 Type 2 diabetes mellitus without complications: Secondary | ICD-10-CM

## 2022-06-09 NOTE — Telephone Encounter (Signed)
Pt has upcoming 6 month follow up 7/26, requests labs if able, last ordered were psa, tsh, lip, a1c, cmp, cbc. Please advise

## 2022-06-12 NOTE — Telephone Encounter (Signed)
Coral Spikes, DO     06/11/22 10:39 PM Please order CBC, CMP, Lipid, A1C, TSH.

## 2022-06-12 NOTE — Telephone Encounter (Signed)
Blood work ordered in Epic. Patient notified. 

## 2022-06-14 ENCOUNTER — Ambulatory Visit (INDEPENDENT_AMBULATORY_CARE_PROVIDER_SITE_OTHER): Payer: Medicare Other | Admitting: Family Medicine

## 2022-06-14 VITALS — BP 147/65 | HR 65 | Temp 98.2°F | Ht 68.0 in | Wt 202.4 lb

## 2022-06-14 DIAGNOSIS — I10 Essential (primary) hypertension: Secondary | ICD-10-CM | POA: Diagnosis not present

## 2022-06-14 DIAGNOSIS — Z23 Encounter for immunization: Secondary | ICD-10-CM

## 2022-06-14 DIAGNOSIS — E119 Type 2 diabetes mellitus without complications: Secondary | ICD-10-CM | POA: Diagnosis not present

## 2022-06-14 DIAGNOSIS — E782 Mixed hyperlipidemia: Secondary | ICD-10-CM

## 2022-06-14 DIAGNOSIS — E039 Hypothyroidism, unspecified: Secondary | ICD-10-CM

## 2022-06-14 LAB — COMPREHENSIVE METABOLIC PANEL
ALT: 26 IU/L (ref 0–44)
AST: 24 IU/L (ref 0–40)
Albumin/Globulin Ratio: 2 (ref 1.2–2.2)
Albumin: 4.3 g/dL (ref 3.8–4.8)
Alkaline Phosphatase: 66 IU/L (ref 44–121)
BUN/Creatinine Ratio: 15 (ref 10–24)
BUN: 14 mg/dL (ref 8–27)
Bilirubin Total: 0.7 mg/dL (ref 0.0–1.2)
CO2: 27 mmol/L (ref 20–29)
Calcium: 9.8 mg/dL (ref 8.6–10.2)
Chloride: 103 mmol/L (ref 96–106)
Creatinine, Ser: 0.91 mg/dL (ref 0.76–1.27)
Globulin, Total: 2.1 g/dL (ref 1.5–4.5)
Glucose: 93 mg/dL (ref 70–99)
Potassium: 5.1 mmol/L (ref 3.5–5.2)
Sodium: 141 mmol/L (ref 134–144)
Total Protein: 6.4 g/dL (ref 6.0–8.5)
eGFR: 85 mL/min/{1.73_m2} (ref >59)

## 2022-06-14 LAB — CBC WITH DIFFERENTIAL/PLATELET
Basophils Absolute: 0.1 10*3/uL (ref 0.0–0.2)
Basos: 1 %
EOS (ABSOLUTE): 0.4 10*3/uL (ref 0.0–0.4)
Eos: 6 %
Hematocrit: 43.7 % (ref 37.5–51.0)
Hemoglobin: 14.8 g/dL (ref 13.0–17.7)
Immature Grans (Abs): 0 10*3/uL (ref 0.0–0.1)
Immature Granulocytes: 0 %
Lymphocytes Absolute: 2.1 10*3/uL (ref 0.7–3.1)
Lymphs: 31 %
MCH: 32 pg (ref 26.6–33.0)
MCHC: 33.9 g/dL (ref 31.5–35.7)
MCV: 94 fL (ref 79–97)
Monocytes Absolute: 0.9 10*3/uL (ref 0.1–0.9)
Monocytes: 13 %
Neutrophils Absolute: 3.3 10*3/uL (ref 1.4–7.0)
Neutrophils: 49 %
Platelets: 195 10*3/uL (ref 150–450)
RBC: 4.63 x10E6/uL (ref 4.14–5.80)
RDW: 12.9 % (ref 11.6–15.4)
WBC: 6.8 10*3/uL (ref 3.4–10.8)

## 2022-06-14 LAB — LIPID PANEL
Chol/HDL Ratio: 4.1 ratio (ref 0.0–5.0)
Cholesterol, Total: 178 mg/dL (ref 100–199)
HDL: 43 mg/dL (ref >39)
LDL Chol Calc (NIH): 100 mg/dL — ABNORMAL HIGH (ref 0–99)
Triglycerides: 201 mg/dL — ABNORMAL HIGH (ref 0–149)
VLDL Cholesterol Cal: 35 mg/dL (ref 5–40)

## 2022-06-14 LAB — TSH: TSH: 1.48 u[IU]/mL (ref 0.450–4.500)

## 2022-06-14 LAB — HEMOGLOBIN A1C
Est. average glucose Bld gHb Est-mCnc: 143 mg/dL
Hgb A1c MFr Bld: 6.6 % — ABNORMAL HIGH (ref 4.8–5.6)

## 2022-06-14 MED ORDER — LISINOPRIL 40 MG PO TABS: 40.0000 mg | ORAL_TABLET | Freq: Every day | ORAL | 2 refills | Status: DC

## 2022-06-14 MED ORDER — LEVOTHYROXINE SODIUM 100 MCG PO TABS: 100.0000 ug | ORAL_TABLET | Freq: Every day | ORAL | 2 refills | Status: DC

## 2022-06-14 MED ORDER — AMLODIPINE BESYLATE 5 MG PO TABS: 5.0000 mg | ORAL_TABLET | Freq: Every day | ORAL | 2 refills | Status: DC

## 2022-06-14 MED ORDER — ATORVASTATIN CALCIUM 80 MG PO TABS: 80.0000 mg | ORAL_TABLET | Freq: Every day | ORAL | 2 refills | Status: DC

## 2022-06-14 NOTE — Assessment & Plan Note (Signed)
Stable.  Diet controlled.  Continue to watch diet and stay active.

## 2022-06-14 NOTE — Assessment & Plan Note (Addendum)
Given his age and LDL of 100, will continue atorvastatin 80 mg daily.  No further intervention at this time.

## 2022-06-14 NOTE — Patient Instructions (Signed)
Continue your medication.  If your BP runs low (90/50 or less, please let me know).  Follow up in 6 months.  Take care  Dr. Lacinda Axon

## 2022-06-14 NOTE — Assessment & Plan Note (Signed)
Fairly well controlled at his age. Continue Norvasc and Lisinopril.

## 2022-06-14 NOTE — Progress Notes (Signed)
Subjective:  Patient ID: Cameron Smith, male    DOB: 1941-01-09  Age: 81 y.o. MRN: [id number]  CC: Chief Complaint  Patient presents with   Follow-up    Patient here for 6 month follow up    HPI:  81 year old male with hypertension, hypothyroidism, diet-controlled type 2 diabetes, hyperlipidemia presents for follow-up.  Patient states that he is doing well.  Other than aches and pains in the morning he feels well.  No chest pain or shortness of breath.  Good appetite.  No difficulty urinating.  Patient's type 2 diabetes remains diet controlled.  Most recent A1c 6.6.  Hypothyroidism stable on Synthroid 100 mcg daily.  Most recent TSH 1.48.  Hyperlipidemia is stable.  He has mildly elevated triglycerides at 201.  LDL 100.  He is compliant with atorvastatin 80 mg.  Hypertension is fairly well controlled.  Patient reports that he has had some low readings at night but he takes his blood pressure while he is lying back in a chair.  He is compliant with lisinopril and amlodipine.  He is overall doing well.  Patient Active Problem List   Diagnosis Date Noted   Type 2 diabetes, diet controlled ([place]) 12/15/2021   History of subdural hematoma (post traumatic) 04/17/2017   Essential hypertension 07/08/2013   Hyperlipidemia 07/08/2013   Hypothyroidism 07/08/2013    Social Hx   Social History   Socioeconomic History   Marital status: Married    Spouse name: Not on file   Number of children: Not on file   Years of education: Not on file   Highest education level: Not on file  Occupational History   Not on file  Tobacco Use   Smoking status: Former    Packs/day: 3.00    Years: 16.00    Total pack years: 48.00    Types: Cigarettes    Quit date: 69    Years since quitting: 41.5   Smokeless tobacco: Never  Vaping Use   Vaping Use: Never used  Substance and Sexual Activity   Alcohol use: No   Drug use: No   Sexual activity: Not on file  Other Topics Concern   Not on file   Social History Narrative   Married x 60 years. 2 children, 4 grands, 4 great grands   Social Determinants of Health   Financial Resource Strain: Low Risk  (08/02/2021)   Overall Financial Resource Strain (CARDIA)    Difficulty of Paying Living Expenses: Not hard at all  Food Insecurity: No Food Insecurity (08/02/2021)   Hunger Vital Sign    Worried About Running Out of Food in the Last Year: Never true    Ran Out of Food in the Last Year: Never true  Transportation Needs: No Transportation Needs (08/02/2021)   PRAPARE - Hydrologist (Medical): No    Lack of Transportation (Non-Medical): No  Physical Activity: Insufficiently Active (08/02/2021)   Exercise Vital Sign    Days of Exercise per Week: 3 days    Minutes of Exercise per Session: 30 min  Stress: No Stress Concern Present (08/02/2021)   [place]    Feeling of Stress : Not at all  Social Connections: [place] (08/02/2021)   Social Connection and Isolation Panel [NHANES]    Frequency of Communication with Friends and Family: More than three times a week    Frequency of Social Gatherings with Friends and Family: More than three times  a week    Attends Religious Services: More than 4 times per year    Active Member of Clubs or Organizations: Yes    Attends Music therapist: More than 4 times per year    Marital Status: Married    Review of Systems Per HPI  Objective:  BP (!) 147/65   Pulse 65   Temp 98.2 F (36.8 C) (Oral)   Ht '5\' 8"'$  (1.727 m)   Wt 202 lb 6.4 oz (91.8 kg)   SpO2 98%   BMI 30.77 kg/m      06/14/2022    8:53 AM 06/14/2022    8:46 AM 04/19/2022   11:42 AM  BP/Weight  Systolic BP [phone]  Diastolic BP 65 73 66  Wt. (Lbs)  202.4   BMI  30.77 kg/m2     Physical Exam Vitals and nursing note reviewed.  Constitutional:      General: He is not in acute distress.    Appearance:  Normal appearance.  HENT:     Head: Normocephalic and atraumatic.  Cardiovascular:     Rate and Rhythm: Normal rate and regular rhythm.  Pulmonary:     Effort: Pulmonary effort is normal.     Breath sounds: Normal breath sounds. No wheezing, rhonchi or rales.  Neurological:     Mental Status: He is alert.     Lab Results  Component Value Date   WBC 6.8 06/13/2022   HGB 14.8 06/13/2022   HCT 43.7 06/13/2022   PLT 195 06/13/2022   GLUCOSE 93 06/13/2022   CHOL 178 06/13/2022   TRIG 201 (H) 06/13/2022   HDL 43 06/13/2022   LDLCALC 100 (H) 06/13/2022   ALT 26 06/13/2022   AST 24 06/13/2022   NA 141 06/13/2022   K 5.1 06/13/2022   CL 103 06/13/2022   CREATININE 0.91 06/13/2022   BUN 14 06/13/2022   CO2 27 06/13/2022   TSH 1.480 06/13/2022   PSA 0.44 01/15/2015   INR 0.92 12/20/2009   HGBA1C 6.6 (H) 06/13/2022     Assessment & Plan:   Problem List Items Addressed This Visit       Cardiovascular and Mediastinum   Essential hypertension - Primary    Fairly well controlled at his age. Continue Norvasc and Lisinopril.      Relevant Medications   amLODipine (NORVASC) 5 MG tablet   atorvastatin (LIPITOR) 80 MG tablet   lisinopril (ZESTRIL) 40 MG tablet     Endocrine   Hypothyroidism    Stable.  Continue current dosing of Synthroid.      Relevant Medications   levothyroxine (SYNTHROID) 100 MCG tablet   Type 2 diabetes, diet controlled ([facility])    Stable.  Diet controlled.  Continue to watch diet and stay active.      Relevant Medications   atorvastatin (LIPITOR) 80 MG tablet   lisinopril (ZESTRIL) 40 MG tablet     Other   Hyperlipidemia    Given his age and LDL of 100, will continue atorvastatin 80 mg daily.  No further intervention at this time.      Relevant Medications   amLODipine (NORVASC) 5 MG tablet   atorvastatin (LIPITOR) 80 MG tablet   lisinopril (ZESTRIL) 40 MG tablet   Other Visit Diagnoses     Need for vaccination       Relevant Orders    Pneumococcal conjugate vaccine 20-valent (Prevnar 20) (Completed)       Meds ordered this encounter  Medications  amLODipine (NORVASC) 5 MG tablet    Sig: Take 1 tablet (5 mg total) by mouth daily.    Dispense:  100 tablet    Refill:  2    Requesting 1 year supply   atorvastatin (LIPITOR) 80 MG tablet    Sig: Take 1 tablet (80 mg total) by mouth daily.    Dispense:  100 tablet    Refill:  2    Requesting 1 year supply   levothyroxine (SYNTHROID) 100 MCG tablet    Sig: Take 1 tablet (100 mcg total) by mouth daily.    Dispense:  100 tablet    Refill:  2    Requesting 1 year supply   lisinopril (ZESTRIL) 40 MG tablet    Sig: Take 1 tablet (40 mg total) by mouth daily.    Dispense:  100 tablet    Refill:  2    Requesting 1 year supply    Follow-up:  Return in about 6 months (around 12/15/2022).  [place]

## 2022-06-14 NOTE — Assessment & Plan Note (Signed)
Stable.  Continue current dosing of Synthroid. 

## 2022-06-20 LAB — HM DIABETES EYE EXAM

## 2022-08-30 ENCOUNTER — Ambulatory Visit (INDEPENDENT_AMBULATORY_CARE_PROVIDER_SITE_OTHER): Payer: Medicare Other

## 2022-08-30 DIAGNOSIS — Z0000 Encounter for general adult medical examination without abnormal findings: Secondary | ICD-10-CM

## 2022-08-30 NOTE — Progress Notes (Signed)
Virtual Visit via Telephone Note  I connected with  Cameron Smith on 08/30/22 at 11:30 AM EDT by telephone and verified that I am speaking with the correct person using two identifiers.  Location: Patient: home Provider: [facility] Persons participating in the virtual visit: patient/Nurse Health Advisor   I discussed the limitations, risks, security and privacy concerns of performing an evaluation and management service by telephone and the availability of in person appointments. The patient expressed understanding and agreed to proceed.  Interactive audio and video telecommunications were attempted between this nurse and patient, however failed, due to patient having technical difficulties OR patient did not have access to video capability.  We continued and completed visit with audio only.  Some vital signs may be absent or patient reported.   Cameron David, LPN  Subjective:   Cameron Smith is a 81 y.o. male who presents for Medicare Annual/Subsequent preventive examination.  Review of Systems     Cardiac Risk Factors include: advanced age (>60mn, >>21women);hypertension;dyslipidemia;male gender;sedentary lifestyle;obesity (BMI >30kg/m2)     Objective:    There were no vitals filed for this visit. There is no height or weight on file to calculate BMI.     08/30/2022   11:28 AM 04/19/2022    9:51 AM 08/02/2021    9:05 AM 08/22/2019    7:27 AM 10/11/2016    5:26 PM 10/31/2011    9:33 AM  Advanced Directives  Does Patient Have a Medical Advance Directive? No No No No Yes Patient does not have advance directive;Patient would not like information  Type of Advance Directive     Living will;Healthcare Power of ATyronein Chart?     No - copy requested   Would patient like information on creating a medical advance directive? No - Patient declined No - Patient declined No - Patient declined No - Patient declined      Current Medications  (verified) Outpatient Encounter Medications as of 08/30/2022  Medication Sig   amLODipine (NORVASC) 5 MG tablet Take 1 tablet (5 mg total) by mouth daily.   aspirin EC 81 MG tablet Take 81 mg by mouth every evening.   atorvastatin (LIPITOR) 80 MG tablet Take 1 tablet (80 mg total) by mouth daily.   Coenzyme Q10 200 MG capsule Take 200 mg by mouth every evening.   levothyroxine (SYNTHROID) 100 MCG tablet Take 1 tablet (100 mcg total) by mouth daily.   lisinopril (ZESTRIL) 40 MG tablet Take 1 tablet (40 mg total) by mouth daily.   Polyvinyl Alcohol-Povidone PF (REFRESH) 1.4-0.6 % SOLN Place 1 drop into both eyes daily as needed (dry eye).   vitamin C (ASCORBIC ACID) 500 MG tablet Take 500 mg by mouth daily.   vitamin E 400 UNIT capsule Take 400 Units by mouth every evening.    zinc gluconate 50 MG tablet Take 50 mg by mouth daily.   Sod Picosulfate-Mag Ox-Cit Acd (CLENPIQ) 10-3.5-12 MG-GM -GM/175ML SOLN Take 1 kit by mouth as directed. (Patient not taking: Reported on 08/30/2022)   No facility-administered encounter medications on file as of 08/30/2022.    Allergies (verified) Patient has no known allergies.   History: Past Medical History:  Diagnosis Date   Acute inflammation of nasal sinus    Chronic   Anxiety    Diverticulitis    Hypercholesteremia    Hypertension    Hypothyroidism    Nasal turbinate hypertrophy    Reactive airways dysfunction  syndrome [facility])    Past Surgical History:  Procedure Laterality Date   BACK SURGERY     sciatic   COLONOSCOPY  10/31/2011   Adequate prep, extensive left-sided diverticula, colon otherwise normal.   COLONOSCOPY WITH PROPOFOL N/A 04/19/2022   Procedure: COLONOSCOPY WITH PROPOFOL;  Surgeon: Daneil Dolin, MD;  Location: [facility];  Service: Endoscopy;  Laterality: N/A;  11:15am   NASAL SEPTOPLASTY W/ TURBINOPLASTY Bilateral 08/22/2019   Procedure: Sinus Endoscopy   Nasal Septoplasty with Bilateral TURBINATE REDUCTION, Bilateral  Total Ethmoidectomy and Sphenoidotomy;  Surgeon: Melida Quitter, MD;  Location: [facility] OR;  Service: ENT;  Laterality: Bilateral;   NASAL SINUS SURGERY     Family History  Problem Relation Age of Onset   Heart disease Father    Diabetes Sister    Colon cancer Neg Hx    Colon polyps Neg Hx    Social History   Socioeconomic History   Marital status: Married    Spouse name: Not on file   Number of children: Not on file   Years of education: Not on file   Highest education level: Not on file  Occupational History   Not on file  Tobacco Use   Smoking status: Former    Packs/day: 3.00    Years: 16.00    Total pack years: 48.00    Types: Cigarettes    Quit date: 68    Years since quitting: 41.8   Smokeless tobacco: Never  Vaping Use   Vaping Use: Never used  Substance and Sexual Activity   Alcohol use: No   Drug use: No   Sexual activity: Not on file  Other Topics Concern   Not on file  Social History Narrative   Married x 60 years. 2 children, 4 grands, 4 great grands   Social Determinants of Health   Financial Resource Strain: Low Risk  (08/30/2022)   Overall Financial Resource Strain (CARDIA)    Difficulty of Paying Living Expenses: Not hard at all  Food Insecurity: No Food Insecurity (08/30/2022)   Hunger Vital Sign    Worried About Running Out of Food in the Last Year: Never true    Ran Out of Food in the Last Year: Never true  Transportation Needs: No Transportation Needs (08/30/2022)   PRAPARE - Hydrologist (Medical): No    Lack of Transportation (Non-Medical): No  Physical Activity: Inactive (08/30/2022)   Exercise Vital Sign    Days of Exercise per Week: 0 days    Minutes of Exercise per Session: 0 min  Stress: No Stress Concern Present (08/30/2022)   [place]    Feeling of Stress : Not at all  Social Connections: Moderately Integrated (08/30/2022)   Social  Connection and Isolation Panel [NHANES]    Frequency of Communication with Friends and Family: Twice a week    Frequency of Social Gatherings with Friends and Family: More than three times a week    Attends Religious Services: More than 4 times per year    Active Member of [employer] or Organizations: No    Attends Music therapist: Never    Marital Status: Married    Tobacco Counseling Counseling given: Not Answered   Clinical Intake:  Pre-visit preparation completed: Yes  Pain : No/denies pain     Nutritional Risks: None Diabetes: No  How often do you need to have someone help you when you read instructions, pamphlets,  or other written materials from your doctor or pharmacy?: 1 - Never  Diabetic?no  Interpreter Needed?: No  Information entered by :: Kirke Shaggy, LPN   Activities of Daily Living    08/30/2022   11:29 AM  In your present state of health, do you have any difficulty performing the following activities:  Hearing? 0  Vision? 0  Difficulty concentrating or making decisions? 0  Walking or climbing stairs? 0  Dressing or bathing? 0  Doing errands, shopping? 0  Preparing Food and eating ? N  Using the Toilet? N  In the past six months, have you accidently leaked urine? N  Do you have problems with loss of bowel control? N  Managing your Medications? N  Managing your Finances? N  Housekeeping or managing your Housekeeping? N    Patient Care Team: Coral Spikes, DO as PCP - General (Family Medicine)  Indicate any recent Medical Services you may have received from other than [facility] providers in the past year (date may be approximate).     Assessment:   This is a routine wellness examination for [facility]  Hearing/Vision screen Hearing Screening - Comments:: Wears aids Vision Screening - Comments:: Wears glasses- Dr.Groat  Dietary issues and exercise activities discussed: Current Exercise Habits: The patient does not participate in regular  exercise at present   Goals Addressed             This Visit's Progress    DIET - EAT MORE FRUITS AND VEGETABLES         Depression Screen    08/30/2022   11:27 AM 08/02/2021    9:01 AM 05/11/2021    9:36 AM 10/20/2020    2:57 PM 02/26/2018    8:47 AM 02/09/2017    9:05 AM  PHQ 2/9 Scores  PHQ - 2 Score 0 0 0 0 0 3  PHQ- 9 Score 0     3    Fall Risk    08/30/2022   11:29 AM 08/02/2021    9:06 AM 05/11/2021    9:35 AM 10/20/2020    2:56 PM 04/13/2020    8:47 AM  Fall Risk   Falls in the past year? 0 0 0 0 0  Number falls in past yr: 0 0 0    Injury with Fall? 0 0 0    Risk for fall due to : No Fall Risks No Fall Risks No Fall Risks    Follow up Falls prevention discussed;Falls evaluation completed Falls prevention discussed Falls evaluation completed Falls evaluation completed Falls evaluation completed    FALL RISK PREVENTION PERTAINING TO THE HOME:  Any stairs in or around the home? Yes  If so, are there any without handrails? No  Home free of loose throw rugs in walkways, pet beds, electrical cords, etc? No  Adequate lighting in your home to reduce risk of falls? No   ASSISTIVE DEVICES UTILIZED TO PREVENT FALLS:  Life alert? No  Use of a cane, walker or w/c? No  Grab bars in the bathroom? Yes  Shower chair or bench in shower? Yes  Elevated toilet seat or a handicapped toilet? No    Cognitive Function:        08/30/2022   11:30 AM 08/02/2021    9:09 AM  6CIT Screen  What Year? 0 points 0 points  What month? 0 points 0 points  What time? 0 points 0 points  Count back from 20 0 points 0 points  Months in reverse  0 points 0 points  Repeat phrase 2 points 0 points  Total Score 2 points 0 points    Immunizations Immunization History  Administered Date(s) Administered   Fluad Quad(high Dose 65+) 09/19/2021   Influenza, High Dose Seasonal PF 09/01/2019   Influenza,inj,Quad PF,6+ Mos 08/04/2014, 08/04/2016, 08/13/2017, 08/23/2018   Influenza-Unspecified  09/11/2013, 09/10/2015, 09/02/2019, 09/28/2020, 08/20/2021   Moderna Sars-Covid-2 Vaccination 12/13/2019, 01/12/2020, 10/01/2020, 03/11/2021   PNEUMOCOCCAL CONJUGATE-20 06/14/2022   Pneumococcal Conjugate-13 09/18/2012   Zoster Recombinat (Shingrix) 07/26/2021, 08/20/2021   Zoster, Live 02/27/2008    TDAP status: Due, Education has been provided regarding the importance of this vaccine. Advised may receive this vaccine at local pharmacy or Health Dept. Aware to provide a copy of the vaccination record if obtained from local pharmacy or Health Dept. Verbalized acceptance and understanding.  Flu Vaccine status: Up to date  Pneumococcal vaccine status: Due, Education has been provided regarding the importance of this vaccine. Advised may receive this vaccine at local pharmacy or Health Dept. Aware to provide a copy of the vaccination record if obtained from local pharmacy or Health Dept. Verbalized acceptance and understanding.  Covid-19 vaccine status: Completed vaccines  Qualifies for Shingles Vaccine? Yes   Zostavax completed Yes   Shingrix Completed?: Yes  Screening Tests Health Maintenance  Topic Date Due   TETANUS/TDAP  Never done   Diabetic kidney evaluation - Urine ACR  02/21/2019   COVID-19 Vaccine (5 - Moderna risk series) 05/06/2021   INFLUENZA VACCINE  06/20/2022   HEMOGLOBIN A1C  12/14/2022   Diabetic kidney evaluation - GFR measurement  06/14/2023   FOOT EXAM  06/15/2023   OPHTHALMOLOGY EXAM  06/21/2023   Pneumonia Vaccine 28+ Years old  Completed   Zoster Vaccines- Shingrix  Completed   HPV VACCINES  Aged Out    Health Maintenance  Health Maintenance Due  Topic Date Due   TETANUS/TDAP  Never done   Diabetic kidney evaluation - Urine ACR  02/21/2019   COVID-19 Vaccine (5 - Moderna risk series) 05/06/2021   INFLUENZA VACCINE  06/20/2022    Colorectal cancer screening: No longer required.   Lung Cancer Screening: (Low Dose CT Chest recommended if Age 53-80  years, 30 pack-year currently smoking OR have quit w/in 15years.) does not qualify.    Additional Screening:  Hepatitis C Screening: does not qualify; Completed no  Vision Screening: Recommended annual ophthalmology exams for early detection of glaucoma and other disorders of the eye. Is the patient up to date with their annual eye exam?  Yes  Who is the provider or what is the name of the office in which the patient attends annual eye exams? Dr.Groat If pt is not established with a provider, would they like to be referred to a provider to establish care? No .   Dental Screening: Recommended annual dental exams for proper oral hygiene  Community Resource Referral / Chronic Care Management: CRR required this visit?  No   CCM required this visit?  No      Plan:     I have personally reviewed and noted the following in the patient's chart:   Medical and social history Use of alcohol, tobacco or illicit drugs  Current medications and supplements including opioid prescriptions. Patient is not currently taking opioid prescriptions. Functional ability and status Nutritional status Physical activity Advanced directives List of other physicians Hospitalizations, surgeries, and ER visits in previous 12 months Vitals Screenings to include cognitive, depression, and falls Referrals and appointments  In addition, I  have reviewed and discussed with patient certain preventive protocols, quality metrics, and best practice recommendations. A written personalized care plan for preventive services as well as general preventive health recommendations were provided to patient.     Cameron David, LPN   [id number]   Nurse Notes: none

## 2022-08-30 NOTE — Patient Instructions (Signed)
Cameron Smith , Thank you for taking time to come for your Medicare Wellness Visit. I appreciate your ongoing commitment to your health goals. Please review the following plan we discussed and let me know if I can assist you in the future.   Screening recommendations/referrals: Colonoscopy: aged out Recommended yearly ophthalmology/optometry visit for glaucoma screening and checkup Recommended yearly dental visit for hygiene and checkup  Vaccinations: Influenza vaccine: 09/19/21 Pneumococcal vaccine: 09/18/12 Tdap vaccine: n/d Shingles vaccine: Zostavax 02/27/08   Shingrix 07/26/21, 08/20/21   Covid-19: 12/13/19, 01/12/20, 10/01/20, 03/11/21  Advanced directives: no  Conditions/risks identified: none  Next appointment: Follow up in one year for your annual wellness visit. 09/11/23 @ 9:15 am by phone  Preventive Care 80 Years and Older, Male Preventive care refers to lifestyle choices and visits with your health care provider that can promote health and wellness. What does preventive care include? A yearly physical exam. This is also called an annual well check. Dental exams once or twice a year. Routine eye exams. Ask your health care provider how often you should have your eyes checked. Personal lifestyle choices, including: Daily care of your teeth and gums. Regular physical activity. Eating a healthy diet. Avoiding tobacco and drug use. Limiting alcohol use. Practicing safe sex. Taking low doses of aspirin every day. Taking vitamin and mineral supplements as recommended by your health care provider. What happens during an annual well check? The services and screenings done by your health care provider during your annual well check will depend on your age, overall health, lifestyle risk factors, and family history of disease. Counseling  Your health care provider may ask you questions about your: Alcohol use. Tobacco use. Drug use. Emotional well-being. Home and relationship  well-being. Sexual activity. Eating habits. History of falls. Memory and ability to understand (cognition). Work and work Statistician. Screening  You may have the following tests or measurements: Height, weight, and BMI. Blood pressure. Lipid and cholesterol levels. These may be checked every 5 years, or more frequently if you are over 30 years old. Skin check. Lung cancer screening. You may have this screening every year starting at age 80 if you have a 30-pack-year history of smoking and currently smoke or have quit within the past 15 years. Fecal occult blood test (FOBT) of the stool. You may have this test every year starting at age 80. Flexible sigmoidoscopy or colonoscopy. You may have a sigmoidoscopy every 5 years or a colonoscopy every 10 years starting at age 80. Prostate cancer screening. Recommendations will vary depending on your family history and other risks. Hepatitis C blood test. Hepatitis B blood test. Sexually transmitted disease (STD) testing. Diabetes screening. This is done by checking your blood sugar (glucose) after you have not eaten for a while (fasting). You may have this done every 1-3 years. Abdominal aortic aneurysm (AAA) screening. You may need this if you are a current or former smoker. Osteoporosis. You may be screened starting at age 80 if you are at high risk. Talk with your health care provider about your test results, treatment options, and if necessary, the need for more tests. Vaccines  Your health care provider may recommend certain vaccines, such as: Influenza vaccine. This is recommended every year. Tetanus, diphtheria, and acellular pertussis (Tdap, Td) vaccine. You may need a Td booster every 10 years. Zoster vaccine. You may need this after age 80. Pneumococcal 13-valent conjugate (PCV13) vaccine. One dose is recommended after age 80. Pneumococcal polysaccharide (PPSV23) vaccine. One dose is recommended  after age 80. Talk to your health care  provider about which screenings and vaccines you need and how often you need them. This information is not intended to replace advice given to you by your health care provider. Make sure you discuss any questions you have with your health care provider. Document Released: 12/03/2015 Document Revised: 07/26/2016 Document Reviewed: 09/07/2015 Elsevier Interactive Patient Education  2017 [place] Prevention in the Home Falls can cause injuries. They can happen to people of all ages. There are many things you can do to make your home safe and to help prevent falls. What can I do on the outside of my home? Regularly fix the edges of walkways and driveways and fix any cracks. Remove anything that might make you trip as you walk through a door, such as a raised step or threshold. Trim any bushes or trees on the path to your home. Use bright outdoor lighting. Clear any walking paths of anything that might make someone trip, such as rocks or tools. Regularly check to see if handrails are loose or broken. Make sure that both sides of any steps have handrails. Any raised decks and porches should have guardrails on the edges. Have any leaves, snow, or ice cleared regularly. Use sand or salt on walking paths during winter. Clean up any spills in your garage right away. This includes oil or grease spills. What can I do in the bathroom? Use night lights. Install grab bars by the toilet and in the tub and shower. Do not use towel bars as grab bars. Use non-skid mats or decals in the tub or shower. If you need to sit down in the shower, use a plastic, non-slip stool. Keep the floor dry. Clean up any water that spills on the floor as soon as it happens. Remove soap buildup in the tub or shower regularly. Attach bath mats securely with double-sided non-slip rug tape. Do not have throw rugs and other things on the floor that can make you trip. What can I do in the bedroom? Use night lights. Make  sure that you have a light by your bed that is easy to reach. Do not use any sheets or blankets that are too big for your bed. They should not hang down onto the floor. Have a firm chair that has side arms. You can use this for support while you get dressed. Do not have throw rugs and other things on the floor that can make you trip. What can I do in the kitchen? Clean up any spills right away. Avoid walking on wet floors. Keep items that you use a lot in easy-to-reach places. If you need to reach something above you, use a strong step stool that has a grab bar. Keep electrical cords out of the way. Do not use floor polish or wax that makes floors slippery. If you must use wax, use non-skid floor wax. Do not have throw rugs and other things on the floor that can make you trip. What can I do with my stairs? Do not leave any items on the stairs. Make sure that there are handrails on both sides of the stairs and use them. Fix handrails that are broken or loose. Make sure that handrails are as long as the stairways. Check any carpeting to make sure that it is firmly attached to the stairs. Fix any carpet that is loose or worn. Avoid having throw rugs at the top or bottom of the stairs. If you  do have throw rugs, attach them to the floor with carpet tape. Make sure that you have a light switch at the top of the stairs and the bottom of the stairs. If you do not have them, ask someone to add them for you. What else can I do to help prevent falls? Wear shoes that: Do not have high heels. Have rubber bottoms. Are comfortable and fit you well. Are closed at the toe. Do not wear sandals. If you use a stepladder: Make sure that it is fully opened. Do not climb a closed stepladder. Make sure that both sides of the stepladder are locked into place. Ask someone to hold it for you, if possible. Clearly mark and make sure that you can see: Any grab bars or handrails. First and last steps. Where the  edge of each step is. Use tools that help you move around (mobility aids) if they are needed. These include: Canes. Walkers. Scooters. Crutches. Turn on the lights when you go into a dark area. Replace any light bulbs as soon as they burn out. Set up your furniture so you have a clear path. Avoid moving your furniture around. If any of your floors are uneven, fix them. If there are any pets around you, be aware of where they are. Review your medicines with your doctor. Some medicines can make you feel dizzy. This can increase your chance of falling. Ask your doctor what other things that you can do to help prevent falls. This information is not intended to replace advice given to you by your health care provider. Make sure you discuss any questions you have with your health care provider. Document Released: 09/02/2009 Document Revised: 04/13/2016 Document Reviewed: 12/11/2014 Elsevier Interactive Patient Education  2017 [employer].

## 2022-12-11 ENCOUNTER — Telehealth: Payer: Self-pay | Admitting: Family Medicine

## 2022-12-11 DIAGNOSIS — R7301 Impaired fasting glucose: Secondary | ICD-10-CM

## 2022-12-11 DIAGNOSIS — E119 Type 2 diabetes mellitus without complications: Secondary | ICD-10-CM

## 2022-12-11 DIAGNOSIS — I10 Essential (primary) hypertension: Secondary | ICD-10-CM

## 2022-12-11 DIAGNOSIS — Z130 Encounter for screening for diseases of the blood and blood-forming organs and certain disorders involving the immune mechanism: Secondary | ICD-10-CM

## 2022-12-11 DIAGNOSIS — E785 Hyperlipidemia, unspecified: Secondary | ICD-10-CM

## 2022-12-11 DIAGNOSIS — E039 Hypothyroidism, unspecified: Secondary | ICD-10-CM

## 2022-12-11 NOTE — Telephone Encounter (Signed)
Patient has appointment next week and requesting labs before he is seen. Most recent labs were done 06/13/22 lip, cmp, A1c, tsh, cbc. Please advise

## 2022-12-11 NOTE — Telephone Encounter (Signed)
Patient has appointment next week and requesting labs before he is seen.

## 2022-12-12 NOTE — Telephone Encounter (Signed)
Left message for patient to return the call for additional details and recommendations.   

## 2022-12-18 DIAGNOSIS — E119 Type 2 diabetes mellitus without complications: Secondary | ICD-10-CM | POA: Diagnosis not present

## 2022-12-18 DIAGNOSIS — E039 Hypothyroidism, unspecified: Secondary | ICD-10-CM | POA: Diagnosis not present

## 2022-12-18 DIAGNOSIS — E785 Hyperlipidemia, unspecified: Secondary | ICD-10-CM | POA: Diagnosis not present

## 2022-12-18 DIAGNOSIS — I10 Essential (primary) hypertension: Secondary | ICD-10-CM | POA: Diagnosis not present

## 2022-12-18 DIAGNOSIS — R7301 Impaired fasting glucose: Secondary | ICD-10-CM | POA: Diagnosis not present

## 2022-12-19 LAB — COMPREHENSIVE METABOLIC PANEL
ALT: 24 IU/L (ref 0–44)
AST: 20 IU/L (ref 0–40)
Albumin/Globulin Ratio: 1.9 (ref 1.2–2.2)
Albumin: 4.1 g/dL (ref 3.8–4.8)
Alkaline Phosphatase: 74 IU/L (ref 44–121)
BUN/Creatinine Ratio: 18 (ref 10–24)
BUN: 16 mg/dL (ref 8–27)
Bilirubin Total: 0.5 mg/dL (ref 0.0–1.2)
CO2: 23 mmol/L (ref 20–29)
Calcium: 9.4 mg/dL (ref 8.6–10.2)
Chloride: 105 mmol/L (ref 96–106)
Creatinine, Ser: 0.9 mg/dL (ref 0.76–1.27)
Globulin, Total: 2.2 g/dL (ref 1.5–4.5)
Glucose: 108 mg/dL — ABNORMAL HIGH (ref 70–99)
Potassium: 5.3 mmol/L — ABNORMAL HIGH (ref 3.5–5.2)
Sodium: 141 mmol/L (ref 134–144)
Total Protein: 6.3 g/dL (ref 6.0–8.5)
eGFR: 86 mL/min/{1.73_m2} (ref >59)

## 2022-12-19 LAB — CBC WITH DIFFERENTIAL/PLATELET
Basophils Absolute: 0.1 10*3/uL (ref 0.0–0.2)
Basos: 1 %
EOS (ABSOLUTE): 0.4 10*3/uL (ref 0.0–0.4)
Eos: 5 %
Hematocrit: 42.5 % (ref 37.5–51.0)
Hemoglobin: 14.2 g/dL (ref 13.0–17.7)
Immature Grans (Abs): 0 10*3/uL (ref 0.0–0.1)
Immature Granulocytes: 0 %
Lymphocytes Absolute: 2 10*3/uL (ref 0.7–3.1)
Lymphs: 30 %
MCH: 31.6 pg (ref 26.6–33.0)
MCHC: 33.4 g/dL (ref 31.5–35.7)
MCV: 94 fL (ref 79–97)
Monocytes Absolute: 0.8 10*3/uL (ref 0.1–0.9)
Monocytes: 12 %
Neutrophils Absolute: 3.4 10*3/uL (ref 1.4–7.0)
Neutrophils: 52 %
Platelets: 218 10*3/uL (ref 150–450)
RBC: 4.5 x10E6/uL (ref 4.14–5.80)
RDW: 12 % (ref 11.6–15.4)
WBC: 6.6 10*3/uL (ref 3.4–10.8)

## 2022-12-19 LAB — LIPID PANEL
Chol/HDL Ratio: 4.7 ratio (ref 0.0–5.0)
Cholesterol, Total: 180 mg/dL (ref 100–199)
HDL: 38 mg/dL — ABNORMAL LOW (ref >39)
LDL Chol Calc (NIH): 118 mg/dL — ABNORMAL HIGH (ref 0–99)
Triglycerides: 135 mg/dL (ref 0–149)
VLDL Cholesterol Cal: 24 mg/dL (ref 5–40)

## 2022-12-19 LAB — TSH: TSH: 1.31 u[IU]/mL (ref 0.450–4.500)

## 2022-12-19 LAB — HEMOGLOBIN A1C
Est. average glucose Bld gHb Est-mCnc: 160 mg/dL
Hgb A1c MFr Bld: 7.2 % — ABNORMAL HIGH (ref 4.8–5.6)

## 2022-12-20 ENCOUNTER — Ambulatory Visit (INDEPENDENT_AMBULATORY_CARE_PROVIDER_SITE_OTHER): Payer: Medicare Other | Admitting: Family Medicine

## 2022-12-20 DIAGNOSIS — E782 Mixed hyperlipidemia: Secondary | ICD-10-CM | POA: Diagnosis not present

## 2022-12-20 DIAGNOSIS — E119 Type 2 diabetes mellitus without complications: Secondary | ICD-10-CM | POA: Diagnosis not present

## 2022-12-20 DIAGNOSIS — I10 Essential (primary) hypertension: Secondary | ICD-10-CM

## 2022-12-20 DIAGNOSIS — E039 Hypothyroidism, unspecified: Secondary | ICD-10-CM

## 2022-12-20 NOTE — Progress Notes (Signed)
Subjective:  Patient ID: Cameron Smith, male    DOB: 1941-07-13  Age: 82 y.o. MRN: [id number]  CC: Chief Complaint  Patient presents with   Hypertension   Diabetes    HPI:  82 year old male with hypertension, hypothyroidism, type 2 diabetes, hyperlipidemia presents for follow-up.  Patient's A1c has risen from 6.6-7.2.  He is not currently on any pharmacotherapy.  Will discuss this today.  Patient's LDL also mildly elevated at 118 despite compliance with Lipitor 80 mg daily.  We will discuss this as well.  Blood pressure is well-controlled on amlodipine and lisinopril.  Patient states that overall he is doing well.  He states that he is having some difficulty with erections.  He states that he does not want any medication for this.  Denies chest pain or shortness of breath.  Patient Active Problem List   Diagnosis Date Noted   Type 2 diabetes, diet controlled ([place]) 12/15/2021   History of subdural hematoma (post traumatic) 04/17/2017   Essential hypertension 07/08/2013   Hyperlipidemia 07/08/2013   Hypothyroidism 07/08/2013    Social Hx   Social History   Socioeconomic History   Marital status: Married    Spouse name: Not on file   Number of children: Not on file   Years of education: Not on file   Highest education level: Not on file  Occupational History   Not on file  Tobacco Use   Smoking status: Former    Packs/day: 3.00    Years: 16.00    Total pack years: 48.00    Types: Cigarettes    Quit date: 60    Years since quitting: 42.1   Smokeless tobacco: Never  Vaping Use   Vaping Use: Never used  Substance and Sexual Activity   Alcohol use: No   Drug use: No   Sexual activity: Not on file  Other Topics Concern   Not on file  Social History Narrative   Married x 60 years. 2 children, 4 grands, 4 great grands   Social Determinants of Health   Financial Resource Strain: Low Risk  (08/30/2022)   Overall Financial Resource Strain (CARDIA)     Difficulty of Paying Living Expenses: Not hard at all  Food Insecurity: No Food Insecurity (08/30/2022)   Hunger Vital Sign    Worried About Running Out of Food in the Last Year: Never true    Ran Out of Food in the Last Year: Never true  Transportation Needs: No Transportation Needs (08/30/2022)   PRAPARE - Hydrologist (Medical): No    Lack of Transportation (Non-Medical): No  Physical Activity: Inactive (08/30/2022)   Exercise Vital Sign    Days of Exercise per Week: 0 days    Minutes of Exercise per Session: 0 min  Stress: No Stress Concern Present (08/30/2022)   [place]    Feeling of Stress : Not at all  Social Connections: Moderately Integrated (08/30/2022)   Social Connection and Isolation Panel [NHANES]    Frequency of Communication with Friends and Family: Twice a week    Frequency of Social Gatherings with Friends and Family: More than three times a week    Attends Religious Services: More than 4 times per year    Active Member of [employer] or Organizations: No    Attends Archivist Meetings: Never    Marital Status: Married    Review of Systems Per HPI  Objective:  BP  136/64   Pulse 75   Temp 98.2 F (36.8 C)   Wt 202 lb (91.6 kg)   SpO2 95%   BMI 30.71 kg/m      12/20/2022    8:22 AM 06/14/2022    8:53 AM 06/14/2022    8:46 AM  BP/Weight  Systolic BP 177 939 030  Diastolic BP 64 65 73  Wt. (Lbs) 202  202.4  BMI 30.71 kg/m2  30.77 kg/m2    Physical Exam Vitals and nursing note reviewed.  Constitutional:      General: He is not in acute distress.    Appearance: Normal appearance.  HENT:     Head: Normocephalic and atraumatic.  Eyes:     General:        Right eye: No discharge.        Left eye: No discharge.     Conjunctiva/sclera: Conjunctivae normal.  Cardiovascular:     Rate and Rhythm: Normal rate and regular rhythm.  Pulmonary:     Effort:  Pulmonary effort is normal.     Breath sounds: Normal breath sounds. No wheezing, rhonchi or rales.  Neurological:     Mental Status: He is alert.  Psychiatric:        Mood and Affect: Mood normal.        Behavior: Behavior normal.     Lab Results  Component Value Date   WBC 6.6 12/18/2022   HGB 14.2 12/18/2022   HCT 42.5 12/18/2022   PLT 218 12/18/2022   GLUCOSE 108 (H) 12/18/2022   CHOL 180 12/18/2022   TRIG 135 12/18/2022   HDL 38 (L) 12/18/2022   LDLCALC 118 (H) 12/18/2022   ALT 24 12/18/2022   AST 20 12/18/2022   NA 141 12/18/2022   K 5.3 (H) 12/18/2022   CL 105 12/18/2022   CREATININE 0.90 12/18/2022   BUN 16 12/18/2022   CO2 23 12/18/2022   TSH 1.310 12/18/2022   PSA 0.44 01/15/2015   INR 0.92 12/20/2009   HGBA1C 7.2 (H) 12/18/2022     Assessment & Plan:   Problem List Items Addressed This Visit       Cardiovascular and Mediastinum   Essential hypertension    Stable.  Continue amlodipine and lisinopril.        Endocrine   Hypothyroidism    Stable on current dose of Synthroid.  Continue.      Type 2 diabetes, diet controlled ([facility])    A1c has risen to 7.2.  Goal is less than 7.  Discussed this today.  Patient would like to try dietary changes before pharmacotherapy.  Discussed this today.  Follow-up in 3 months.      Relevant Orders   Hemoglobin A1c   Microalbumin / creatinine urine ratio     Other   Hyperlipidemia    Continue Lipitor.  Discussed dietary changes.  Advised patient to avoid lots of butter and fat back.  He is eating quite a bit of this.       Follow-up:  3 months  [place]

## 2022-12-20 NOTE — Assessment & Plan Note (Signed)
Continue Lipitor.  Discussed dietary changes.  Advised patient to avoid lots of butter and fat back.  He is eating quite a bit of this.

## 2022-12-20 NOTE — Patient Instructions (Signed)
Watch your diet.  Stay active.  Follow up in 3 months.   Take care  Dr. Lacinda Axon

## 2022-12-20 NOTE — Assessment & Plan Note (Signed)
A1c has risen to 7.2.  Goal is less than 7.  Discussed this today.  Patient would like to try dietary changes before pharmacotherapy.  Discussed this today.  Follow-up in 3 months.

## 2022-12-20 NOTE — Assessment & Plan Note (Signed)
Stable on current dose of Synthroid.  Continue.

## 2022-12-20 NOTE — Assessment & Plan Note (Signed)
Stable.  Continue amlodipine and lisinopril.

## 2022-12-26 ENCOUNTER — Telehealth: Payer: Self-pay | Admitting: Family Medicine

## 2022-12-26 NOTE — Telephone Encounter (Signed)
error 

## 2022-12-27 ENCOUNTER — Ambulatory Visit (INDEPENDENT_AMBULATORY_CARE_PROVIDER_SITE_OTHER): Payer: Medicare Other | Admitting: Family Medicine

## 2022-12-27 VITALS — BP 137/71 | Temp 97.8°F | Ht 68.0 in | Wt 199.2 lb

## 2022-12-27 DIAGNOSIS — J209 Acute bronchitis, unspecified: Secondary | ICD-10-CM

## 2022-12-27 MED ORDER — AMOXICILLIN-POT CLAVULANATE 875-125 MG PO TABS: 1.0000 | ORAL_TABLET | Freq: Two times a day (BID) | ORAL | 0 refills | Status: DC

## 2022-12-27 NOTE — Progress Notes (Signed)
Subjective:  Patient ID: Cameron Smith, male    DOB: March 03, 1941  Age: 82 y.o. MRN: [id number]  CC: Chief Complaint  Patient presents with   Cough    Congestion since visit lat Thursday    HPI:  82 year old male presents for evaluation of the above.  Patient reports that he has been sick since last Thursday.  His wife has been sick as well.  He reports ongoing cough and congestion.  Productive sputum and discolored nasal discharge.  He feels poorly.  Feels fatigued.  He states that he has had some night sweats.  No documented fever.  Afebrile currently.  States that he feels like the cough is slightly better today but he still feels poorly.  Patient Active Problem List   Diagnosis Date Noted   Acute bronchitis 12/27/2022   Type 2 diabetes, diet controlled ([place]) 12/15/2021   History of subdural hematoma (post traumatic) 04/17/2017   Essential hypertension 07/08/2013   Hyperlipidemia 07/08/2013   Hypothyroidism 07/08/2013    Social Hx   Social History   Socioeconomic History   Marital status: Married    Spouse name: Not on file   Number of children: Not on file   Years of education: Not on file   Highest education level: Not on file  Occupational History   Not on file  Tobacco Use   Smoking status: Former    Packs/day: 3.00    Years: 16.00    Total pack years: 48.00    Types: Cigarettes    Quit date: 77    Years since quitting: 42.1   Smokeless tobacco: Never  Vaping Use   Vaping Use: Never used  Substance and Sexual Activity   Alcohol use: No   Drug use: No   Sexual activity: Not on file  Other Topics Concern   Not on file  Social History Narrative   Married x 60 years. 2 children, 4 grands, 4 great grands   Social Determinants of Health   Financial Resource Strain: Low Risk  (08/30/2022)   Overall Financial Resource Strain (CARDIA)    Difficulty of Paying Living Expenses: Not hard at all  Food Insecurity: No Food Insecurity (08/30/2022)   Hunger Vital  Sign    Worried About Running Out of Food in the Last Year: Never true    Ran Out of Food in the Last Year: Never true  Transportation Needs: No Transportation Needs (08/30/2022)   PRAPARE - Hydrologist (Medical): No    Lack of Transportation (Non-Medical): No  Physical Activity: Inactive (08/30/2022)   Exercise Vital Sign    Days of Exercise per Week: 0 days    Minutes of Exercise per Session: 0 min  Stress: No Stress Concern Present (08/30/2022)   [place]    Feeling of Stress : Not at all  Social Connections: Moderately Integrated (08/30/2022)   Social Connection and Isolation Panel [NHANES]    Frequency of Communication with Friends and Family: Twice a week    Frequency of Social Gatherings with Friends and Family: More than three times a week    Attends Religious Services: More than 4 times per year    Active Member of [employer] or Organizations: No    Attends Archivist Meetings: Never    Marital Status: Married    Review of Systems Per HPI  Objective:  BP 137/71   Temp 97.8 F (36.6 C) (Oral)  Ht '5\' 8"'$  (1.727 m)   Wt 199 lb 3.2 oz (90.4 kg)   BMI 30.29 kg/m      12/27/2022   11:35 AM 12/20/2022    8:22 AM 06/14/2022    8:53 AM  BP/Weight  Systolic BP 174 944 967  Diastolic BP 71 64 65  Wt. (Lbs) 199.2 202   BMI 30.29 kg/m2 30.71 kg/m2     Physical Exam Vitals and nursing note reviewed.  Constitutional:      General: He is not in acute distress.    Appearance: Normal appearance.  HENT:     Head: Normocephalic and atraumatic.     Mouth/Throat:     Pharynx: Oropharynx is clear.  Eyes:     General:        Right eye: No discharge.        Left eye: No discharge.     Conjunctiva/sclera: Conjunctivae normal.  Cardiovascular:     Rate and Rhythm: Normal rate and regular rhythm.  Pulmonary:     Effort: Pulmonary effort is normal.     Breath sounds: Normal  breath sounds. No wheezing or rales.  Neurological:     Mental Status: He is alert.     Lab Results  Component Value Date   WBC 6.6 12/18/2022   HGB 14.2 12/18/2022   HCT 42.5 12/18/2022   PLT 218 12/18/2022   GLUCOSE 108 (H) 12/18/2022   CHOL 180 12/18/2022   TRIG 135 12/18/2022   HDL 38 (L) 12/18/2022   LDLCALC 118 (H) 12/18/2022   ALT 24 12/18/2022   AST 20 12/18/2022   NA 141 12/18/2022   K 5.3 (H) 12/18/2022   CL 105 12/18/2022   CREATININE 0.90 12/18/2022   BUN 16 12/18/2022   CO2 23 12/18/2022   TSH 1.310 12/18/2022   PSA 0.44 01/15/2015   INR 0.92 12/20/2009   HGBA1C 7.2 (H) 12/18/2022     Assessment & Plan:   Problem List Items Addressed This Visit       Respiratory   Acute bronchitis - Primary    Treating with Augmentin.       Meds ordered this encounter  Medications   amoxicillin-clavulanate (AUGMENTIN) 875-125 MG tablet    Sig: Take 1 tablet by mouth 2 (two) times daily.    Dispense:  20 tablet    Refill:  [place]

## 2022-12-27 NOTE — Patient Instructions (Signed)
Rest. Fluids.  Antibiotic as prescribed.  Take care  Dr. Lacinda Axon

## 2022-12-27 NOTE — Assessment & Plan Note (Signed)
Treating with Augmentin. 

## 2023-02-02 ENCOUNTER — Ambulatory Visit (INDEPENDENT_AMBULATORY_CARE_PROVIDER_SITE_OTHER): Payer: Medicare Other | Admitting: Family Medicine

## 2023-02-02 ENCOUNTER — Ambulatory Visit: Payer: Medicare Other | Admitting: Family Medicine

## 2023-02-02 VITALS — BP 138/68 | HR 77 | Temp 98.1°F | Ht 68.0 in | Wt 199.0 lb

## 2023-02-02 DIAGNOSIS — J329 Chronic sinusitis, unspecified: Secondary | ICD-10-CM

## 2023-02-02 MED ORDER — PREDNISONE 10 MG (21) PO TBPK: ORAL_TABLET | ORAL | 0 refills | Status: DC

## 2023-02-02 MED ORDER — DOXYCYCLINE HYCLATE 100 MG PO TABS: 100.0000 mg | ORAL_TABLET | Freq: Two times a day (BID) | ORAL | 0 refills | Status: DC

## 2023-02-02 MED ORDER — IPRATROPIUM BROMIDE 0.06 % NA SOLN: 2.0000 | Freq: Four times a day (QID) | NASAL | 0 refills | Status: AC | PRN

## 2023-02-02 NOTE — Patient Instructions (Signed)
Antibiotic, prednisone and nasal spray as prescribed.  If the is continues or worsens, please let me know.  Take care  Dr. Lacinda Axon

## 2023-02-04 DIAGNOSIS — J329 Chronic sinusitis, unspecified: Secondary | ICD-10-CM | POA: Insufficient documentation

## 2023-02-04 NOTE — Assessment & Plan Note (Signed)
Treating with Doxy, Prednisone, and Atrovent.

## 2023-02-04 NOTE — Progress Notes (Signed)
Subjective:  Patient ID: Cameron Smith, male    DOB: [birth year]  Age: 82 y.o. MRN: [id number]  CC: Chief Complaint  Patient presents with   Nasal Congestion    Since feb 12/2022 took abx restarted after completing abx , no other symptoms Throat congestion , throat irritated    HPI:  82 year old male with the below mentioned medical problems presents for evaluation of the above.  Patient reports that he is still not feeling well. Treated for acute bronchitis on 2/7. He completed the antibiotic course and reports that he improved and when the antibiotic was finished his symptoms recurred. He reports persistent congestion with discolored discharge. No fever. No significant cough.  Patient Active Problem List   Diagnosis Date Noted   Rhinosinusitis 02/04/2023   Type 2 diabetes, diet controlled ([place]) 12/15/2021   History of subdural hematoma (post traumatic) 04/17/2017   Essential hypertension 07/08/2013   Hyperlipidemia 07/08/2013   Hypothyroidism 07/08/2013    Social Hx   Social History   Socioeconomic History   Marital status: Married    Spouse name: Not on file   Number of children: Not on file   Years of education: Not on file   Highest education level: Not on file  Occupational History   Not on file  Tobacco Use   Smoking status: Former    Packs/day: 3.00    Years: 16.00    Additional pack years: 0.00    Total pack years: 48.00    Types: Cigarettes    Quit date: 2    Years since quitting: 42.2   Smokeless tobacco: Never  Vaping Use   Vaping Use: Never used  Substance and Sexual Activity   Alcohol use: No   Drug use: No   Sexual activity: Not on file  Other Topics Concern   Not on file  Social History Narrative   Married x 60 years. 2 children, 4 grands, 4 great grands   Social Determinants of Health   Financial Resource Strain: Low Risk  (08/30/2022)   Overall Financial Resource Strain (CARDIA)    Difficulty of Paying Living Expenses: Not hard at  all  Food Insecurity: No Food Insecurity (08/30/2022)   Hunger Vital Sign    Worried About Running Out of Food in the Last Year: Never true    Ran Out of Food in the Last Year: Never true  Transportation Needs: No Transportation Needs (08/30/2022)   PRAPARE - Hydrologist (Medical): No    Lack of Transportation (Non-Medical): No  Physical Activity: Inactive (08/30/2022)   Exercise Vital Sign    Days of Exercise per Week: 0 days    Minutes of Exercise per Session: 0 min  Stress: No Stress Concern Present (08/30/2022)   [place]    Feeling of Stress : Not at all  Social Connections: Moderately Integrated (08/30/2022)   Social Connection and Isolation Panel [NHANES]    Frequency of Communication with Friends and Family: Twice a week    Frequency of Social Gatherings with Friends and Family: More than three times a week    Attends Religious Services: More than 4 times per year    Active Member of [employer] or Organizations: No    Attends Archivist Meetings: Never    Marital Status: Married    Review of Systems Per HPI  Objective:  BP 138/68   Pulse 77   Temp 98.1 F (  36.7 C)   Ht 5\' 8"  (1.727 m)   Wt 199 lb (90.3 kg)   SpO2 94%   BMI 30.26 kg/m      02/02/2023    2:53 PM 12/27/2022   11:35 AM 12/20/2022    8:22 AM  BP/Weight  Systolic BP 0000000 [id number]  Diastolic BP 68 71 64  Wt. (Lbs) 199 199.2 202  BMI 30.26 kg/m2 30.29 kg/m2 30.71 kg/m2    Physical Exam Vitals and nursing note reviewed.  Constitutional:      General: He is not in acute distress.    Appearance: Normal appearance.  HENT:     Head: Normocephalic and atraumatic.  Eyes:     General:        Right eye: No discharge.        Left eye: No discharge.     Conjunctiva/sclera: Conjunctivae normal.  Cardiovascular:     Rate and Rhythm: Normal rate and regular rhythm.  Pulmonary:     Effort: Pulmonary  effort is normal.     Breath sounds: Normal breath sounds. No wheezing, rhonchi or rales.  Neurological:     Mental Status: He is alert.  Psychiatric:        Mood and Affect: Mood normal.        Behavior: Behavior normal.     Lab Results  Component Value Date   WBC 6.6 12/18/2022   HGB 14.2 12/18/2022   HCT 42.5 12/18/2022   PLT 218 12/18/2022   GLUCOSE 108 (H) 12/18/2022   CHOL 180 12/18/2022   TRIG 135 12/18/2022   HDL 38 (L) 12/18/2022   LDLCALC 118 (H) 12/18/2022   ALT 24 12/18/2022   AST 20 12/18/2022   NA 141 12/18/2022   K 5.3 (H) 12/18/2022   CL 105 12/18/2022   CREATININE 0.90 12/18/2022   BUN 16 12/18/2022   CO2 23 12/18/2022   TSH 1.310 12/18/2022   PSA 0.44 01/15/2015   INR 0.92 12/20/2009   HGBA1C 7.2 (H) 12/18/2022     Assessment & Plan:   Problem List Items Addressed This Visit       Respiratory   Rhinosinusitis - Primary    Treating with Doxy, Prednisone, and Atrovent.       Relevant Medications   doxycycline (VIBRA-TABS) 100 MG tablet   predniSONE (STERAPRED UNI-PAK 21 TAB) 10 MG (21) TBPK tablet   ipratropium (ATROVENT) 0.06 % nasal spray    Meds ordered this encounter  Medications   doxycycline (VIBRA-TABS) 100 MG tablet    Sig: Take 1 tablet (100 mg total) by mouth 2 (two) times daily.    Dispense:  14 tablet    Refill:  0   predniSONE (STERAPRED UNI-PAK 21 TAB) 10 MG (21) TBPK tablet    Sig: 6 tablets on day 1; decrease by 1 tablet daily until gone.    Dispense:  21 tablet    Refill:  0   ipratropium (ATROVENT) 0.06 % nasal spray    Sig: Place 2 sprays into both nostrils 4 (four) times daily as needed for rhinitis.    Dispense:  15 mL    Refill:  0    Follow-up:  Return if symptoms worsen or fail to improve.  [place]

## 2023-03-04 DIAGNOSIS — J441 Chronic obstructive pulmonary disease with (acute) exacerbation: Secondary | ICD-10-CM | POA: Diagnosis not present

## 2023-03-12 DIAGNOSIS — R053 Chronic cough: Secondary | ICD-10-CM | POA: Diagnosis not present

## 2023-03-12 DIAGNOSIS — E119 Type 2 diabetes mellitus without complications: Secondary | ICD-10-CM | POA: Diagnosis not present

## 2023-03-12 NOTE — Progress Notes (Unsigned)
Cameron Smith, male    DOB: [birth year]    MRN: [id number]   Brief patient profile:  5 yowm  quit smoking 1985  referred to pulmonary clinic in [place]  03/14/2023 by [facility] for recurrent nose and throat congestion since early Feb 2024 assoc with wheezing better p prednisone plus both saba hfa and neb where never needed one before   Dr Jenne Pane Jearld Fenton 2 sinus surgeries last 08/22/2019  1.  Chronic sinusitis. 2.  Inferior turbinate hypertrophy. 3.  Nasal septal deviation.   POSTOPERATIVE DIAGNOSES: 1.  Chronic sinusitis. 2.  Inferior turbinate hypertrophy 3.  Nasal septal deviation.    History of Present Illness  03/14/2023  Pulmonary/ 1st office eval/ Cameron Smith / [facility]  on ACEi  Chief Complaint  Patient presents with   Consult    Pt consult for COPD states that he does't feel like he has any breathing problems, but has had a lingering cold  Finished with prednisone / abx but does not know name (zpak)  Dyspnea:  baseline = able to weed eat/ garden but does not push mower  Cough: still coughing (grabs throat area where feels "congestion" lingering  Sleep: was having trouble but better x last 3 noct prior to OV   SABA use: last used 1.5 h prior to ov but didn't  bring either hfa or neb (used both) 02: none   No obvious day to day or daytime pattern/variability or assoc excess/ purulent sputum or mucus plugs or hemoptysis or cp or chest tightness, subjective wheeze or overt   hb symptoms.   Sleeping now  without nocturnal  or early am exacerbation  of respiratory  c/o's or need for noct saba. Also denies any obvious fluctuation of symptoms with weather or environmental changes or other aggravating or alleviating factors except as outlined above   No unusual exposure hx or h/o childhood pna/ asthma or knowledge of premature birth.  Current Allergies, Complete Past Medical History, Past Surgical History, Family History, and Social History were reviewed in [employer] record.  ROS  The following are not active complaints unless bolded Hoarseness, sore throat, dysphagia, dental problems, itching, sneezing,  nasal congestion or discharge of excess mucus or purulent secretions, ear ache,   fever, chills, sweats, unintended wt loss or wt gain, classically pleuritic or exertional cp,  orthopnea pnd or arm/hand swelling  or leg swelling, presyncope, palpitations, abdominal pain, anorexia, nausea, vomiting, diarrhea  or change in bowel habits or change in bladder habits, change in stools or change in urine, dysuria, hematuria,  rash, arthralgias, visual complaints, headache, numbness, weakness or ataxia or problems with walking or coordination,  change in mood or  memory.              Past Medical History:  Diagnosis Date   Acute inflammation of nasal sinus    Chronic   Anxiety    Diverticulitis    Hypercholesteremia    Hypertension    Hypothyroidism    Nasal turbinate hypertrophy    Reactive airways dysfunction syndrome     Outpatient Medications Prior to Visit  Medication Sig Dispense Refill   amLODipine (NORVASC) 5 MG tablet Take 1 tablet (5 mg total) by mouth daily. 100 tablet 2   aspirin EC 81 MG tablet Take 81 mg by mouth every evening.     atorvastatin (LIPITOR) 80 MG tablet Take 1 tablet (80 mg total) by mouth daily. 100 tablet 2   Coenzyme Q10 200 MG  capsule Take 200 mg by mouth every evening.     ipratropium (ATROVENT) 0.06 % nasal spray Place 2 sprays into both nostrils 4 (four) times daily as needed for rhinitis. 15 mL 0   levothyroxine (SYNTHROID) 100 MCG tablet Take 1 tablet (100 mcg total) by mouth daily. 100 tablet 2   Polyvinyl Alcohol-Povidone PF (REFRESH) 1.4-0.6 % SOLN Place 1 drop into both eyes daily as needed (dry eye).     Sod Picosulfate-Mag Ox-Cit Acd (CLENPIQ) 10-3.5-12 MG-GM -GM/175ML SOLN Take 1 kit by mouth as directed. 350 mL 0   vitamin E 400 UNIT capsule Take 400 Units by mouth every evening.      doxycycline  (VIBRA-TABS) 100 MG tablet Take 1 tablet (100 mg total) by mouth 2 (two) times daily. 14 tablet 0   lisinopril (ZESTRIL) 40 MG tablet Take 1 tablet (40 mg total) by mouth daily. 100 tablet 2   predniSONE (STERAPRED UNI-PAK 21 TAB) 10 MG (21) TBPK tablet 6 tablets on day 1; decrease by 1 tablet daily until gone. 21 tablet 0   vitamin C (ASCORBIC ACID) 500 MG tablet Take 500 mg by mouth daily. (Patient not taking: Reported on 03/14/2023)     zinc gluconate 50 MG tablet Take 50 mg by mouth daily. (Patient not taking: Reported on 03/14/2023)     No facility-administered medications prior to visit.     Objective:     BP 122/62   Pulse 79   Ht  (1.727 m)   Wt 199 lb 9.6 oz (90.5 kg)   SpO2 91%   BMI 30.35 kg/m   SpO2: 91 %   Amb stoic wm nad with spont harsh upper airway coughing fits   HEENT : Oropharynx  clear     Nasal turbinates nl    NECK :  without  apparent JVD/ palpable Nodes/TM    LUNGS: no acc muscle use,  Nl contour chest which is clear to A and P bilaterally without cough on insp or exp maneuvers   CV:  RRR  no s3 or murmur or increase in P2, and no edema   ABD:  obese soft and nontender with nl inspiratory excursion in the supine position. No bruits or organomegaly appreciated   MS:  Nl gait/ ext warm without deformities Or obvious joint restrictions  calf tenderness, cyanosis or clubbing    SKIN: warm and dry without lesions    NEURO:  alert, approp, nl sensorium with  no motor or cerebellar deficits apparent.    Cxr's reported nl during this illness but not available at consultation      Assessment   Upper airway cough syndrome vs cough variant asthma Quit smoking 1985 s apparent sequelae - onset of persistent cough x early feb 2024 requiring 4 outpt visits prior to pulm eval 03/14/2023   >>> rec trial off acei 03/14/2023 and change saba to prn with f/u in 4 weeks   Comment: Upper airway cough syndrome (previously labeled PNDS),  is so named because  it's frequently impossible to sort out how much is  CR/sinusitis with freq throat clearing (which can be related to primary GERD)   vs  causing  secondary (" extra esophageal")  GERD from wide swings in gastric pressure that occur with throat clearing, often  promoting self use of mint and menthol lozenges that reduce the lower esophageal sphincter tone and exacerbate the problem further in a cyclical fashion.   These are the same pts (now being labeled as  having "irritable larynx syndrome" by some cough centers) who not infrequently have a history of having failed to tolerate Smith inhibitors,  dry powder inhalers or biphosphonates or report having atypical/extraesophageal reflux symptoms that don't respond to standard doses of PPI  and are easily confused as having aecopd or asthma flares by even experienced allergists/ pulmonologists (myself included).   Before additional w/u need to come off ACEi and let pred wear off, using approp saba:  Advised  Only use your albuterol inhaler as a rescue medication to be used if you can't catch your breath by resting or doing a relaxed purse lip breathing pattern.  - The less you use it, the better it will work when you need it. - Ok to use up to 2 puffs  every 4 hours if you must but call for immediate appointment if use goes up over your usual need  Only use neb albuterol of the inhaler fails - Don't leave home without it !!  (think of it like the spare tire for your car)   Essential hypertension Try off acei 03/14/2023 due to refractory "bronchitis" and "wheeze"   Smith inhibitors are problematic in  pts with airway complaints because  even experienced pulmonologists can't distinguish acei effects from copd/asthma.  By themselves they don't actually cause a problem, much like oxygen can't by itself start a fire, but they certainly serve as a powerful catalyst or enhancer for any "fire"  or inflammatory process in the upper airway, be it caused by an ET  tube or  more commonly URI/  reflux (especially in the obese or pts with known GERD or who are on biphoshonates).    In the era of ARB near equivalency until we have a better handle on the reversibility of the airway problem, it just makes sense to avoid ACEI  entirely in the short run and then decide later, having established a level of airway control using a reasonable limited regimen, whether to add back Smith but even then being very careful to observe the pt for worsening airway control and number of meds used/ needed to control symptoms.    Rec change to benicar 40 mg and f/u in 4-6 weeks, call sooner if needed  Discussed in detail all the  indications, usual  risks and alternatives  relative to the benefits with patient who agrees to proceed with Rx as outlined.             Each maintenance medication was reviewed in detail including emphasizing most importantly the difference between maintenance and prns and under what circumstances the prns are to be triggered using an action plan format where appropriate.  Total time for H and P, chart review, counseling, reviewing hfa/neb  device(s) and generating customized AVS unique to this office visit / same day charting > 45 min new pt eval.          Sandrea Hughs, MD 03/14/2023

## 2023-03-13 LAB — MICROALBUMIN / CREATININE URINE RATIO
Creatinine, Urine: 132.3 mg/dL
Microalb/Creat Ratio: 9 mg/g creat (ref 0–29)
Microalbumin, Urine: 12.4 ug/mL

## 2023-03-13 LAB — HEMOGLOBIN A1C
Est. average glucose Bld gHb Est-mCnc: 148 mg/dL
Hgb A1c MFr Bld: 6.8 % — ABNORMAL HIGH (ref 4.8–5.6)

## 2023-03-14 ENCOUNTER — Encounter: Payer: Self-pay | Admitting: Internal Medicine

## 2023-03-14 ENCOUNTER — Ambulatory Visit: Payer: Medicare Other | Admitting: Internal Medicine

## 2023-03-14 VITALS — BP 122/62 | HR 79 | Ht 68.0 in | Wt 199.6 lb

## 2023-03-14 DIAGNOSIS — I10 Essential (primary) hypertension: Secondary | ICD-10-CM | POA: Diagnosis not present

## 2023-03-14 DIAGNOSIS — R058 Other specified cough: Secondary | ICD-10-CM

## 2023-03-14 MED ORDER — OLMESARTAN MEDOXOMIL 40 MG PO TABS: 40.0000 mg | ORAL_TABLET | Freq: Every day | ORAL | 11 refills | Status: DC

## 2023-03-14 NOTE — Patient Instructions (Addendum)
Stop lisinopril and start olmesartan 40 mg one in evening in its place   For cough take mucinex dm 1200 mg every 12 hours as needed (over the counter)    Plan B = Backup (to supplement plan A, not to replace it) Only use your albuterol inhaler as a rescue medication to be used if you can't catch your breath by resting or doing a relaxed purse lip breathing pattern.  - The less you use it, the better it will work when you need it. - Ok to use the inhaler up to 2 puffs  every 4 hours if you must but call for appointment if use goes up over your usual need - Don't leave home without it !!  (think of it like the spare tire for your car)   Plan C = Crisis (instead of Plan B but only if Plan B stops working) - only use your albuterol nebulizer if you first try Plan B and it fails to help > ok to use the nebulizer up to every 4 hours but if start needing it regularly call for immediate appointment    Please schedule a follow up office visit in 4 weeks, sooner if needed  with all medications /inhalers/ solutions in hand so we can verify exactly what you are taking. This includes all medications from all doctors and over the counters

## 2023-03-14 NOTE — Assessment & Plan Note (Addendum)
Quit smoking 1985 s apparent sequelae - onset of persistent cough x early feb 2024 requiring 4 outpt visits prior to pulm eval 03/14/2023   >>> rec trial off acei 03/14/2023 and change saba to prn with f/u in 4 weeks   Comment: Upper airway cough syndrome (previously labeled PNDS),  is so named because it's frequently impossible to sort out how much is  CR/sinusitis with freq throat clearing (which can be related to primary GERD)   vs  causing  secondary (" extra esophageal")  GERD from wide swings in gastric pressure that occur with throat clearing, often  promoting self use of mint and menthol lozenges that reduce the lower esophageal sphincter tone and exacerbate the problem further in a cyclical fashion.   These are the same pts (now being labeled as having "irritable larynx syndrome" by some cough centers) who not infrequently have a history of having failed to tolerate ace inhibitors,  dry powder inhalers or biphosphonates or report having atypical/extraesophageal reflux symptoms that don't respond to standard doses of PPI  and are easily confused as having aecopd or asthma flares by even experienced allergists/ pulmonologists (myself included).   Before additional w/u need to come off ACEi and let pred wear off, using approp saba:  Advised  Only use your albuterol inhaler as a rescue medication to be used if you can't catch your breath by resting or doing a relaxed purse lip breathing pattern.  - The less you use it, the better it will work when you need it. - Ok to use up to 2 puffs  every 4 hours if you must but call for immediate appointment if use goes up over your usual need  Only use neb albuterol of the inhaler fails - Don't leave home without it !!  (think of it like the spare tire for your car)

## 2023-03-14 NOTE — Assessment & Plan Note (Signed)
Try off acei 03/14/2023 due to refractory "bronchitis" and "wheeze"   ACE inhibitors are problematic in  pts with airway complaints because  even experienced pulmonologists can't distinguish acei effects from copd/asthma.  By themselves they don't actually cause a problem, much like oxygen can't by itself start a fire, but they certainly serve as a powerful catalyst or enhancer for any "fire"  or inflammatory process in the upper airway, be it caused by an ET  tube or more commonly URI/  reflux (especially in the obese or pts with known GERD or who are on biphoshonates).    In the era of ARB near equivalency until we have a better handle on the reversibility of the airway problem, it just makes sense to avoid ACEI  entirely in the short run and then decide later, having established a level of airway control using a reasonable limited regimen, whether to add back ace but even then being very careful to observe the pt for worsening airway control and number of meds used/ needed to control symptoms.    Rec change to benicar 40 mg and f/u in 4-6 weeks, call sooner if needed  Discussed in detail all the  indications, usual  risks and alternatives  relative to the benefits with patient who agrees to proceed with Rx as outlined.             Each maintenance medication was reviewed in detail including emphasizing most importantly the difference between maintenance and prns and under what circumstances the prns are to be triggered using an action plan format where appropriate.  Total time for H and P, chart review, counseling, reviewing hfa/neb  device(s) and generating customized AVS unique to this office visit / same day charting > 45 min new pt eval.

## 2023-03-15 ENCOUNTER — Encounter: Payer: Self-pay | Admitting: Internal Medicine

## 2023-03-20 ENCOUNTER — Ambulatory Visit (INDEPENDENT_AMBULATORY_CARE_PROVIDER_SITE_OTHER): Payer: Medicare Other | Admitting: Family Medicine

## 2023-03-20 DIAGNOSIS — I10 Essential (primary) hypertension: Secondary | ICD-10-CM

## 2023-03-20 DIAGNOSIS — E119 Type 2 diabetes mellitus without complications: Secondary | ICD-10-CM | POA: Diagnosis not present

## 2023-03-20 DIAGNOSIS — R058 Other specified cough: Secondary | ICD-10-CM | POA: Diagnosis not present

## 2023-03-20 MED ORDER — MECLIZINE HCL 25 MG PO TABS: 25.0000 mg | ORAL_TABLET | Freq: Three times a day (TID) | ORAL | 0 refills | Status: DC | PRN

## 2023-03-20 MED ORDER — ALBUTEROL SULFATE HFA 108 (90 BASE) MCG/ACT IN AERS: 1.0000 | INHALATION_SPRAY | Freq: Four times a day (QID) | RESPIRATORY_TRACT | 0 refills | Status: DC | PRN

## 2023-03-20 NOTE — Assessment & Plan Note (Signed)
Stable and at goal

## 2023-03-20 NOTE — Patient Instructions (Signed)
Use the albuterol as needed.   Your lungs are clear.  Diabetes is well controlled.  Follow up in 6 months.  Call with concerns.  Take care  Dr. Adriana Simas

## 2023-03-20 NOTE — Assessment & Plan Note (Signed)
Be elevated today.  Will continue to monitor closely.  Continue olmesartan and amlodipine.

## 2023-03-20 NOTE — Assessment & Plan Note (Signed)
Albuterol as needed.  Follow-up with pulmonology.

## 2023-03-20 NOTE — Progress Notes (Signed)
Subjective:  Patient ID: Cameron Smith, male    DOB: 01/30/1941  Age: 82 y.o. MRN: [id number]  CC:  Follow up   HPI:  82 year old male with upper airway cough syndrome versus cough variant asthma, hypothyroidism, type 2 diabetes, hyperlipidemia presents for follow-up  Type 2 diabetes is diet controlled.  Recent A1c on 4/22 was 6.8.  No proteinuria.  BP elevated here today.  Patient's blood pressure medication was recently changed.  He is currently on olmesartan and amlodipine.  He was taken off ACE inhibitor due to concern that this was contributing to the cough.  Patient continues to have some intermittent cough.  He states that he wheezes at night.  Advised that he can use albuterol as needed.  Patient also reports intermittent dizziness.  Patient Active Problem List   Diagnosis Date Noted   Upper airway cough syndrome vs cough variant asthma 03/14/2023   Type 2 diabetes, diet controlled (HCC) 12/15/2021   History of subdural hematoma (post traumatic) 04/17/2017   Essential hypertension 07/08/2013   Hyperlipidemia 07/08/2013   Hypothyroidism 07/08/2013    Social Hx   Social History   Socioeconomic History   Marital status: Married    Spouse name: Not on file   Number of children: Not on file   Years of education: Not on file   Highest education level: Not on file  Occupational History   Not on file  Tobacco Use   Smoking status: Former    Packs/day: 2.00    Years: 26.00    Additional pack years: 0.00    Total pack years: 52.00    Types: Cigarettes    Start date: 12/28/1957    Quit date: 11/29/1983    Years since quitting: 39.3   Smokeless tobacco: Never  Vaping Use   Vaping Use: Never used  Substance and Sexual Activity   Alcohol use: No   Drug use: No   Sexual activity: Not on file  Other Topics Concern   Not on file  Social History Narrative   Married x 60 years. 2 children, 4 grands, 4 great grands   Social Determinants of Health   Financial Resource  Strain: Low Risk  (08/30/2022)   Overall Financial Resource Strain (CARDIA)    Difficulty of Paying Living Expenses: Not hard at all  Food Insecurity: No Food Insecurity (08/30/2022)   Hunger Vital Sign    Worried About Running Out of Food in the Last Year: Never true    Ran Out of Food in the Last Year: Never true  Transportation Needs: No Transportation Needs (08/30/2022)   PRAPARE - Administrator, Civil Service (Medical): No    Lack of Transportation (Non-Medical): No  Physical Activity: Inactive (08/30/2022)   Exercise Vital Sign    Days of Exercise per Week: 0 days    Minutes of Exercise per Session: 0 min  Stress: No Stress Concern Present (08/30/2022)   [employer] of Occupational Health - Occupational Stress Questionnaire    Feeling of Stress : Not at all  Social Connections: Moderately Integrated (08/30/2022)   Social Connection and Isolation Panel [NHANES]    Frequency of Communication with Friends and Family: Twice a week    Frequency of Social Gatherings with Friends and Family: More than three times a week    Attends Religious Services: More than 4 times per year    Active Member of [employer] or Organizations: No    Attends Banker Meetings: Never  Marital Status: Married    Review of Systems Per HPI  Objective:  BP (!) 158/72   Pulse 70   Temp 97.9 F (36.6 C)   Ht 5\' 8"  (1.727 m)   Wt 197 lb (89.4 kg)   SpO2 95%   BMI 29.95 kg/m      03/20/2023    8:19 AM 03/14/2023   10:12 AM 02/02/2023    2:53 PM  BP/Weight  Systolic BP 158 122 138  Diastolic BP 72 62 68  Wt. (Lbs) 197 199.6 199  BMI 29.95 kg/m2 30.35 kg/m2 30.26 kg/m2    Physical Exam Vitals and nursing note reviewed.  Constitutional:      General: He is not in acute distress.    Appearance: Normal appearance.  HENT:     Head: Normocephalic and atraumatic.  Eyes:     General:        Right eye: No discharge.        Left eye: No discharge.      Conjunctiva/sclera: Conjunctivae normal.  Cardiovascular:     Rate and Rhythm: Normal rate and regular rhythm.     Heart sounds: Murmur heard.  Pulmonary:     Effort: Pulmonary effort is normal.     Breath sounds: Normal breath sounds. No wheezing, rhonchi or rales.  Neurological:     Mental Status: He is alert.  Psychiatric:        Mood and Affect: Mood normal.        Behavior: Behavior normal.     Lab Results  Component Value Date   WBC 6.6 12/18/2022   HGB 14.2 12/18/2022   HCT 42.5 12/18/2022   PLT 218 12/18/2022   GLUCOSE 108 (H) 12/18/2022   CHOL 180 12/18/2022   TRIG 135 12/18/2022   HDL 38 (L) 12/18/2022   LDLCALC 118 (H) 12/18/2022   ALT 24 12/18/2022   AST 20 12/18/2022   NA 141 12/18/2022   K 5.3 (H) 12/18/2022   CL 105 12/18/2022   CREATININE 0.90 12/18/2022   BUN 16 12/18/2022   CO2 23 12/18/2022   TSH 1.310 12/18/2022   PSA 0.44 01/15/2015   INR 0.92 12/20/2009   HGBA1C 6.8 (H) 03/12/2023     Assessment & Plan:   Problem List Items Addressed This Visit       Cardiovascular and Mediastinum   Essential hypertension    Be elevated today.  Will continue to monitor closely.  Continue olmesartan and amlodipine.        Endocrine   Type 2 diabetes, diet controlled (HCC)    Stable and at goal.        Other   Upper airway cough syndrome vs cough variant asthma    Albuterol as needed.  Follow-up with pulmonology.       Meds ordered this encounter  Medications   albuterol (VENTOLIN HFA) 108 (90 Base) MCG/ACT inhaler    Sig: Inhale 1-2 puffs into the lungs every 6 (six) hours as needed for wheezing or shortness of breath.    Dispense:  8 g    Refill:  0   meclizine (ANTIVERT) 25 MG tablet    Sig: Take 1 tablet (25 mg total) by mouth 3 (three) times daily as needed for dizziness.    Dispense:  30 tablet    Refill:  0    Follow-up:  6 months  Nakoa Ganus Adriana Simas DO [facility]

## 2023-04-02 DIAGNOSIS — J069 Acute upper respiratory infection, unspecified: Secondary | ICD-10-CM | POA: Diagnosis not present

## 2023-04-03 DIAGNOSIS — J441 Chronic obstructive pulmonary disease with (acute) exacerbation: Secondary | ICD-10-CM | POA: Diagnosis not present

## 2023-04-22 NOTE — Progress Notes (Unsigned)
Cameron Smith, male    DOB: 1941/10/02    MRN: [id number]   Brief patient profile:  58 yowm  quit smoking 1985  referred to pulmonary clinic in [place]  03/14/2023 by UC for recurrent nose and throat congestion since early Feb 2024 assoc with wheezing better p prednisone plus both saba hfa and neb where never needed one before   Dr Jenne Pane Jearld Fenton 2 sinus surgeries last 08/22/2019  1.  Chronic sinusitis. 2.  Inferior turbinate hypertrophy. 3.  Nasal septal deviation.   POSTOPERATIVE DIAGNOSES: 1.  Chronic sinusitis. 2.  Inferior turbinate hypertrophy 3.  Nasal septal deviation.    History of Present Illness  03/14/2023  Pulmonary/ 1st office eval/ [name] / [name]  on ACEi  Chief Complaint  Patient presents with   Consult    Pt consult for COPD states that he does't feel like he has any breathing problems, but has had a lingering cold  Finished with prednisone / abx but does not know name (zpak)  Dyspnea:  baseline = able to weed eat/ garden but does not push mower  Cough: still coughing (grabs throat area where feels "congestion" lingering  Sleep: was having trouble but better x last 3 noct prior to OV   SABA use: last used 1.5 h prior to ov but didn't  bring either hfa or neb (used both) 02: none  Rec Stop lisinopril and start olmesartan 40 mg one in evening in its place  For cough take mucinex dm 1200 mg every 12 hours as needed (over the counter) Plan B = Backup (to supplement plan A, not to replace it) Only use your albuterol inhaler as a rescue medication Plan C = Crisis (instead of Plan B but only if Plan B stops working) - only use your albuterol nebulizer if you first try Plan B Please schedule a follow up office visit in 4 weeks, sooner if needed  with all medications /inhalers/ solutions in hand   UC p mother's day "cxr ok" and rx  "one shot and cough syrup "    04/23/2023  f/u ov/[place] office/[name] re: ? ACEi case  maint on no resp rx x for xzyal   / Chief Complaint  Patient presents with   Follow-up    Pt f/u states that he is still having a cough w/ congestion.   Dyspnea:  Not limited by breathing from desired activities   Cough: ? about the same as it was - sporadic/ freq related to pnds and xyzal helping   Sleeping: lately not waking him as much  and wife thinks he's wheezing  SABA use: neither hfa / neb seem to help and  the hfa tend to  aggravate the cough    No obvious day to day or daytime variability or assoc excess/ purulent sputum or mucus plugs or hemoptysis or cp or chest tightness,   or overt hb symptoms.   Also denies any obvious fluctuation of symptoms with weather or environmental changes or other aggravating or alleviating factors except as outlined above   No unusual exposure hx or h/o childhood pna/ asthma or knowledge of premature birth.  Current Allergies, Complete Past Medical History, Past Surgical History, Family History, and Social History were reviewed in [employer] record.  ROS  The following are not active complaints unless bolded Hoarseness, sore throat, dysphagia, dental problems, itching, sneezing,  nasal congestion or discharge of excess mucus or purulent secretions, ear ache,   fever, chills, sweats,  unintended wt loss or wt gain, classically pleuritic or exertional cp,  orthopnea pnd or arm/hand swelling  or leg swelling, presyncope, palpitations, abdominal pain, anorexia, nausea, vomiting, diarrhea  or change in bowel habits or change in bladder habits, change in stools or change in urine, dysuria, hematuria,  rash, arthralgias, visual complaints, headache, numbness, weakness or ataxia or problems with walking or coordination,  change in mood or  memory.        Current Meds  Medication Sig   albuterol (VENTOLIN HFA) 108 (90 Base) MCG/ACT inhaler Inhale 1-2 puffs into the lungs every 6 (six) hours as needed for wheezing or shortness of breath.   amLODipine (NORVASC) 5 MG tablet  Take 1 tablet (5 mg total) by mouth daily.   aspirin EC 81 MG tablet Take 81 mg by mouth every evening.   atorvastatin (LIPITOR) 80 MG tablet Take 1 tablet (80 mg total) by mouth daily.   Coenzyme Q10 200 MG capsule Take 200 mg by mouth every evening.   ipratropium (ATROVENT) 0.06 % nasal spray Place 2 sprays into both nostrils 4 (four) times daily as needed for rhinitis.   levothyroxine (SYNTHROID) 100 MCG tablet Take 1 tablet (100 mcg total) by mouth daily.   meclizine (ANTIVERT) 25 MG tablet Take 1 tablet (25 mg total) by mouth 3 (three) times daily as needed for dizziness.   olmesartan (BENICAR) 40 MG tablet Take 1 tablet (40 mg total) by mouth daily.   Polyvinyl Alcohol-Povidone PF (REFRESH) 1.4-0.6 % SOLN Place 1 drop into both eyes daily as needed (dry eye).   Sod Picosulfate-Mag Ox-Cit Acd (CLENPIQ) 10-3.5-12 MG-GM -GM/175ML SOLN Take 1 kit by mouth as directed.   vitamin E 400 UNIT capsule Take 400 Units by mouth every evening.                    Past Medical History:  Diagnosis Date   Acute inflammation of nasal sinus    Chronic   Anxiety    Diverticulitis    Hypercholesteremia    Hypertension    Hypothyroidism    Nasal turbinate hypertrophy    Reactive airways dysfunction syndrome          Objective:     Wt Readings from Last 3 Encounters:  04/23/23 196 lb 8 oz (89.1 kg)  03/20/23 197 lb (89.4 kg)  03/14/23 199 lb 9.6 oz (90.5 kg)      Vital signs reviewed  04/23/2023  - Note at rest 02 sats  93% on RA    General appearance:    stoic mod obese wm shrill coughing fits/ mild pseduowheeze    HEENT : Oropharynx  clear      Nasal turbinates mild edema/ no excess secretions    NECK :  without  apparent JVD/ palpable Nodes/TM    LUNGS: no acc muscle use,  Nl contour chest which is clear to A and P bilaterally without cough on insp or exp maneuvers   CV:  RRR  no s3 or murmur or increase in P2, and no edema   ABD:  soft and nontender with nl inspiratory  excursion in the supine position. No bruits or organomegaly appreciated   MS:  Nl gait/ ext warm without deformities Or obvious joint restrictions  calf tenderness, cyanosis or clubbing    SKIN: warm and dry without lesions    NEURO:  alert, approp, nl sensorium with  no motor or cerebellar deficits apparent.  Assessment

## 2023-04-23 ENCOUNTER — Encounter: Payer: Self-pay | Admitting: Internal Medicine

## 2023-04-23 ENCOUNTER — Ambulatory Visit: Payer: Medicare Other | Admitting: Internal Medicine

## 2023-04-23 VITALS — BP 148/69 | HR 74 | Ht 68.0 in | Wt 196.5 lb

## 2023-04-23 DIAGNOSIS — R058 Other specified cough: Secondary | ICD-10-CM | POA: Diagnosis not present

## 2023-04-23 DIAGNOSIS — I10 Essential (primary) hypertension: Secondary | ICD-10-CM

## 2023-04-23 MED ORDER — OLMESARTAN MEDOXOMIL 40 MG PO TABS: 40.0000 mg | ORAL_TABLET | Freq: Every day | ORAL | 2 refills | Status: DC

## 2023-04-23 MED ORDER — FAMOTIDINE 20 MG PO TABS: ORAL_TABLET | ORAL | 11 refills | Status: DC

## 2023-04-23 MED ORDER — PANTOPRAZOLE SODIUM 40 MG PO TBEC: 40.0000 mg | DELAYED_RELEASE_TABLET | Freq: Every day | ORAL | 2 refills | Status: DC

## 2023-04-23 NOTE — Assessment & Plan Note (Addendum)
Quit smoking 1985 s apparent sequelae - onset of persistent cough x early feb 2024 requiring 4 outpt visits prior to pulm eval 03/14/2023  - trial off acei 03/14/2023 and change saba to prn with f/u in 4 weeks  - Allergy screen 04/23/2023 >  Eos 0. /  IgE  / alpha one AT  - ? Some better  04/23/2023 > refer back  to Jenne Pane (last seen 10/14/19 post op)  and start GERD  rx  Upper airway cough syndrome (previously labeled PNDS),  is so named because it's frequently impossible to sort out how much is  CR/sinusitis with freq throat clearing (which can be related to primary GERD)   vs  causing  secondary (" extra esophageal")  GERD from wide swings in gastric pressure that occur with throat clearing, often  promoting self use of mint and menthol lozenges that reduce the lower esophageal sphincter tone and exacerbate the problem further in a cyclical fashion.   These are the same pts (now being labeled as having "irritable larynx syndrome" by some cough centers) who not infrequently have a history of having failed to tolerate ace inhibitors,  dry powder inhalers (sometimes even intol of hfa saba, which may be the case here)  or biphosphonates or report having atypical/extraesophageal reflux symptoms that don't respond to standard doses of PPI  and are easily confused as having aecopd or asthma flares by even experienced allergists/ pulmonologists (myself included).   Rec; Max gerd rx Stay off acei (see separate a/p)  F/u Jenne Pane re rhinitis/ pnds and continue xyzal in meantime prn

## 2023-04-23 NOTE — Patient Instructions (Addendum)
Ok to try nebulizer for cough  up to every 4 hours next time to see if helps for the following  4 hours   Don't use the puffer - it may aggravate the throat cough   See Dr Jenne Pane but first start  Pantoprazole (protonix) 40 mg   Take  30-60 min before first meal of the day and Pepcid (famotidine)  20 mg after supper until return to office - this is the best way to tell whether stomach acid is contributing to your problem.    GERD (REFLUX)  is an extremely common cause of respiratory symptoms just like yours , many times with no obvious heartburn at all.    It can be treated with medication, but also with lifestyle changes including elevation of the head of your bed (ideally with 6 -8inch blocks under the headboard of your bed),  Smoking cessation, avoidance of late meals, excessive alcohol, and avoid fatty foods, chocolate, peppermint, colas, red wine, and acidic juices such as orange juice.  NO MINT OR MENTHOL PRODUCTS SO NO COUGH DROPS  USE SUGARLESS CANDY INSTEAD (Jolley ranchers or Stover's or Life Savers) or even ice chips will also do - the key is to swallow to prevent all throat clearing. NO OIL BASED VITAMINS - use powdered substitutes.  Avoid fish oil when coughing.    PFTs next available and I will call you the results

## 2023-04-23 NOTE — Assessment & Plan Note (Signed)
Try off acei 03/14/2023 due to refractory "bronchitis" and "wheeze"   Although even in retrospect it may not be clear the ACEi contributed to the pt's symptoms,  Pt improved somewhat off them and adding them back at this point or in the future would risk confusion in interpretation of non-specific respiratory symptoms to which this patient is prone  ie  Better not to muddy the waters here.   >>> continue benicar 40 mg daily /f/u PCP and here prn           Each maintenance medication was reviewed in detail including emphasizing most importantly the difference between maintenance and prns and under what circumstances the prns are to be triggered using an action plan format where appropriate.  Total time for H and P, chart review, counseling, reviewing hfa/ neb  device(s) and generating customized AVS unique to this office visit / same day charting  > 30 min for   refractory respiratory  symptoms of uncertain etiology

## 2023-04-26 LAB — CBC WITH DIFFERENTIAL/PLATELET
Basophils Absolute: 0.1 10*3/uL (ref 0.0–0.2)
Basos: 1 %
EOS (ABSOLUTE): 0.7 10*3/uL — ABNORMAL HIGH (ref 0.0–0.4)
Eos: 10 %
Hematocrit: 43.8 % (ref 37.5–51.0)
Hemoglobin: 14.6 g/dL (ref 13.0–17.7)
Immature Grans (Abs): 0 10*3/uL (ref 0.0–0.1)
Immature Granulocytes: 0 %
Lymphocytes Absolute: 1.4 10*3/uL (ref 0.7–3.1)
Lymphs: 20 %
MCH: 31.5 pg (ref 26.6–33.0)
MCHC: 33.3 g/dL (ref 31.5–35.7)
MCV: 95 fL (ref 79–97)
Monocytes Absolute: 0.9 10*3/uL (ref 0.1–0.9)
Monocytes: 13 %
Neutrophils Absolute: 4 10*3/uL (ref 1.4–7.0)
Neutrophils: 56 %
Platelets: 199 10*3/uL (ref 150–450)
RBC: 4.63 x10E6/uL (ref 4.14–5.80)
RDW: 12.6 % (ref 11.6–15.4)
WBC: 7.1 10*3/uL (ref 3.4–10.8)

## 2023-04-26 LAB — ALPHA-1-ANTITRYPSIN PHENOTYP: A-1 Antitrypsin: 138 mg/dL (ref 101–187)

## 2023-04-26 LAB — IGE: IgE (Immunoglobulin E), Serum: 41 IU/mL (ref 6–495)

## 2023-04-30 ENCOUNTER — Other Ambulatory Visit: Payer: Self-pay | Admitting: Family Medicine

## 2023-05-01 NOTE — Telephone Encounter (Signed)
Pulm changed ACE to ARB

## 2023-05-02 ENCOUNTER — Other Ambulatory Visit: Payer: Self-pay | Admitting: Family Medicine

## 2023-05-04 DIAGNOSIS — J441 Chronic obstructive pulmonary disease with (acute) exacerbation: Secondary | ICD-10-CM | POA: Diagnosis not present

## 2023-05-07 ENCOUNTER — Encounter: Payer: Self-pay | Admitting: Orthopedic Surgery

## 2023-05-07 ENCOUNTER — Ambulatory Visit: Payer: Medicare Other | Admitting: Orthopedic Surgery

## 2023-05-07 VITALS — BP 140/70 | HR 73 | Ht 68.0 in | Wt 195.0 lb

## 2023-05-07 DIAGNOSIS — G5621 Lesion of ulnar nerve, right upper limb: Secondary | ICD-10-CM

## 2023-05-07 MED ORDER — GABAPENTIN 100 MG PO CAPS
100.0000 mg | ORAL_CAPSULE | Freq: Every day | ORAL | 2 refills | Status: AC
Start: 2023-05-07 — End: ?

## 2023-05-07 NOTE — Progress Notes (Signed)
Chief Complaint  Patient presents with   Arm Pain    Has had pain entire right arm, fingers numb now    [name] is 82 years old he presents with numbness in the small and ring finger of his right hand.  His symptoms began with upper arm pain radiating down his arm into his fingers and now he just has numbness in the small ring finger.  He denies neck pain other than occasional cramp  He has no trouble looking behind him is driving  Reports elbow  The symptoms have gotten better on her own  Review of systems nothing else to add weakness in the upper extremity  Past Medical History:  Diagnosis Date   Acute inflammation of nasal sinus    Chronic   Anxiety    Diverticulitis    Hypercholesteremia    Hypertension    Hypothyroidism    Nasal turbinate hypertrophy    Reactive airways dysfunction syndrome (HCC)    BP (!) 140/70   Pulse 73   Ht 5\' 8"  (1.727 m)   Wt 195 lb (88.5 kg)   BMI 29.65 kg/m  Physical Exam Vitals and nursing note reviewed.  Constitutional:      Appearance: Normal appearance.  HENT:     Head: Normocephalic and atraumatic.  Eyes:     General: No scleral icterus.       Right eye: No discharge.        Left eye: No discharge.     Extraocular Movements: Extraocular movements intact.     Conjunctiva/sclera: Conjunctivae normal.     Pupils: Pupils are equal, round, and reactive to light.  Cardiovascular:     Rate and Rhythm: Normal rate.     Pulses: Normal pulses.  Skin:    General: Skin is warm and dry.     Capillary Refill: Capillary refill takes less than 2 seconds.  Neurological:     General: No focal deficit present.     Mental Status: He is alert and oriented to person, place, and time.  Psychiatric:        Mood and Affect: Mood normal.        Behavior: Behavior normal.        Thought Content: Thought content normal.        Judgment: Judgment normal.    Right hand has no atrophy grip strength is normal there is no evidence of erythema in the  hand  Tinel's over the elbow is normal pressure over the Guyon's canal is normal range of motion of the shoulder is normal.  He does have the decreased sensation mainly in the fingers of the right hand involving the right small and long finger distally.  Flexor tendons are intact  Encounter Diagnosis  Name Primary?   Lesion of right ulnar nerve Yes    Assessment and plan  He probably has a lesion somewhere along the path of the ulnar nerve on the right could be cervical elbow or hand  He does note that this started after a fishing trip  At this point he is getting better I would not intervene anymore other than maybe some gabapentin if symptoms start up again

## 2023-06-03 DIAGNOSIS — J441 Chronic obstructive pulmonary disease with (acute) exacerbation: Secondary | ICD-10-CM | POA: Diagnosis not present

## 2023-06-06 DIAGNOSIS — J019 Acute sinusitis, unspecified: Secondary | ICD-10-CM | POA: Diagnosis not present

## 2023-06-12 ENCOUNTER — Other Ambulatory Visit: Payer: Self-pay

## 2023-06-12 ENCOUNTER — Telehealth: Payer: Self-pay

## 2023-06-12 MED ORDER — ATORVASTATIN CALCIUM 80 MG PO TABS: 80.0000 mg | ORAL_TABLET | Freq: Every day | ORAL | 0 refills | Status: DC

## 2023-06-12 MED ORDER — ATORVASTATIN CALCIUM 80 MG PO TABS: 80.0000 mg | ORAL_TABLET | Freq: Every day | ORAL | 2 refills | Status: DC

## 2023-06-12 NOTE — Telephone Encounter (Signed)
Called pt to inform him of he Rx being sent in.

## 2023-06-12 NOTE — Telephone Encounter (Signed)
Prescription Request  06/12/2023  LOV: Visit date not found  What is the name of the medication or equipment? atorvastatin (LIPITOR) 80 MG tablet  pt needs 90 day supply. ALSO NEED 10 DAY SUPPLY SENT TO [facility]   Nurse please call when sent to [facility] for 10 day supply   Have you contacted your pharmacy to request a refill? Yes   Which pharmacy would you like this sent to? 90 day supply sent to [facility]   Patient notified that their request is being sent to the clinical staff for review and that they should receive a response within 2 business days.   Please advise at Mobile [phone] (mobile)

## 2023-06-21 DIAGNOSIS — B078 Other viral warts: Secondary | ICD-10-CM | POA: Diagnosis not present

## 2023-06-21 DIAGNOSIS — L821 Other seborrheic keratosis: Secondary | ICD-10-CM | POA: Diagnosis not present

## 2023-06-27 DIAGNOSIS — E119 Type 2 diabetes mellitus without complications: Secondary | ICD-10-CM | POA: Diagnosis not present

## 2023-06-27 DIAGNOSIS — H2513 Age-related nuclear cataract, bilateral: Secondary | ICD-10-CM | POA: Diagnosis not present

## 2023-06-27 DIAGNOSIS — H40013 Open angle with borderline findings, low risk, bilateral: Secondary | ICD-10-CM | POA: Diagnosis not present

## 2023-06-27 DIAGNOSIS — H43813 Vitreous degeneration, bilateral: Secondary | ICD-10-CM | POA: Diagnosis not present

## 2023-06-27 DIAGNOSIS — H02834 Dermatochalasis of left upper eyelid: Secondary | ICD-10-CM | POA: Diagnosis not present

## 2023-06-27 DIAGNOSIS — H40033 Anatomical narrow angle, bilateral: Secondary | ICD-10-CM | POA: Diagnosis not present

## 2023-06-27 DIAGNOSIS — H02831 Dermatochalasis of right upper eyelid: Secondary | ICD-10-CM | POA: Diagnosis not present

## 2023-06-27 LAB — HM DIABETES EYE EXAM

## 2023-07-04 DIAGNOSIS — J441 Chronic obstructive pulmonary disease with (acute) exacerbation: Secondary | ICD-10-CM | POA: Diagnosis not present

## 2023-07-12 DIAGNOSIS — L821 Other seborrheic keratosis: Secondary | ICD-10-CM | POA: Diagnosis not present

## 2023-07-12 DIAGNOSIS — I788 Other diseases of capillaries: Secondary | ICD-10-CM | POA: Diagnosis not present

## 2023-07-12 DIAGNOSIS — Z1283 Encounter for screening for malignant neoplasm of skin: Secondary | ICD-10-CM | POA: Diagnosis not present

## 2023-07-12 DIAGNOSIS — D225 Melanocytic nevi of trunk: Secondary | ICD-10-CM | POA: Diagnosis not present

## 2023-07-12 DIAGNOSIS — I781 Nevus, non-neoplastic: Secondary | ICD-10-CM | POA: Diagnosis not present

## 2023-07-12 DIAGNOSIS — C44319 Basal cell carcinoma of skin of other parts of face: Secondary | ICD-10-CM | POA: Diagnosis not present

## 2023-07-12 DIAGNOSIS — L989 Disorder of the skin and subcutaneous tissue, unspecified: Secondary | ICD-10-CM | POA: Diagnosis not present

## 2023-08-04 DIAGNOSIS — J441 Chronic obstructive pulmonary disease with (acute) exacerbation: Secondary | ICD-10-CM | POA: Diagnosis not present

## 2023-08-16 DIAGNOSIS — Z85828 Personal history of other malignant neoplasm of skin: Secondary | ICD-10-CM | POA: Diagnosis not present

## 2023-08-16 DIAGNOSIS — B078 Other viral warts: Secondary | ICD-10-CM | POA: Diagnosis not present

## 2023-08-16 DIAGNOSIS — L820 Inflamed seborrheic keratosis: Secondary | ICD-10-CM | POA: Diagnosis not present

## 2023-08-16 DIAGNOSIS — Z08 Encounter for follow-up examination after completed treatment for malignant neoplasm: Secondary | ICD-10-CM | POA: Diagnosis not present

## 2023-09-03 DIAGNOSIS — J441 Chronic obstructive pulmonary disease with (acute) exacerbation: Secondary | ICD-10-CM | POA: Diagnosis not present

## 2023-09-10 ENCOUNTER — Telehealth: Payer: Self-pay | Admitting: Family Medicine

## 2023-09-10 DIAGNOSIS — E782 Mixed hyperlipidemia: Secondary | ICD-10-CM

## 2023-09-10 DIAGNOSIS — E119 Type 2 diabetes mellitus without complications: Secondary | ICD-10-CM

## 2023-09-10 DIAGNOSIS — I10 Essential (primary) hypertension: Secondary | ICD-10-CM

## 2023-09-10 DIAGNOSIS — Z79899 Other long term (current) drug therapy: Secondary | ICD-10-CM

## 2023-09-10 NOTE — Telephone Encounter (Signed)
Blood work ordered in EPIC. Patient notified. 

## 2023-09-10 NOTE — Telephone Encounter (Signed)
Cook, Jayce G, DO     CBC, CMP, Lipid, A1C, Urine Microalbumin

## 2023-09-10 NOTE — Telephone Encounter (Signed)
Patient has an appointment coming up and would like lab orders sent prior to appt.  CB# [phone]

## 2023-09-13 DIAGNOSIS — E119 Type 2 diabetes mellitus without complications: Secondary | ICD-10-CM | POA: Diagnosis not present

## 2023-09-13 DIAGNOSIS — Z79899 Other long term (current) drug therapy: Secondary | ICD-10-CM | POA: Diagnosis not present

## 2023-09-13 DIAGNOSIS — E782 Mixed hyperlipidemia: Secondary | ICD-10-CM | POA: Diagnosis not present

## 2023-09-14 ENCOUNTER — Ambulatory Visit (INDEPENDENT_AMBULATORY_CARE_PROVIDER_SITE_OTHER): Payer: Medicare Other

## 2023-09-14 VITALS — Ht 68.0 in | Wt 200.0 lb

## 2023-09-14 DIAGNOSIS — Z0000 Encounter for general adult medical examination without abnormal findings: Secondary | ICD-10-CM | POA: Diagnosis not present

## 2023-09-14 LAB — CBC WITH DIFFERENTIAL/PLATELET
Basophils Absolute: 0.1 10*3/uL (ref 0.0–0.2)
Basos: 1 %
EOS (ABSOLUTE): 0.5 10*3/uL — ABNORMAL HIGH (ref 0.0–0.4)
Eos: 6 %
Hematocrit: 46 % (ref 37.5–51.0)
Hemoglobin: 15.2 g/dL (ref 13.0–17.7)
Immature Grans (Abs): 0 10*3/uL (ref 0.0–0.1)
Immature Granulocytes: 0 %
Lymphocytes Absolute: 1.5 10*3/uL (ref 0.7–3.1)
Lymphs: 19 %
MCH: 32.1 pg (ref 26.6–33.0)
MCHC: 33 g/dL (ref 31.5–35.7)
MCV: 97 fL (ref 79–97)
Monocytes Absolute: 1 10*3/uL — ABNORMAL HIGH (ref 0.1–0.9)
Monocytes: 12 %
Neutrophils Absolute: 5 10*3/uL (ref 1.4–7.0)
Neutrophils: 62 %
Platelets: 223 10*3/uL (ref 150–450)
RBC: 4.74 x10E6/uL (ref 4.14–5.80)
RDW: 12.6 % (ref 11.6–15.4)
WBC: 8.2 10*3/uL (ref 3.4–10.8)

## 2023-09-14 LAB — CMP14+EGFR
ALT: 26 [IU]/L (ref 0–44)
AST: 18 [IU]/L (ref 0–40)
Albumin: 4.1 g/dL (ref 3.7–4.7)
Alkaline Phosphatase: 79 [IU]/L (ref 44–121)
BUN/Creatinine Ratio: 22 (ref 10–24)
BUN: 19 mg/dL (ref 8–27)
Bilirubin Total: 0.7 mg/dL (ref 0.0–1.2)
CO2: 26 mmol/L (ref 20–29)
Calcium: 9.5 mg/dL (ref 8.6–10.2)
Chloride: 102 mmol/L (ref 96–106)
Creatinine, Ser: 0.87 mg/dL (ref 0.76–1.27)
Globulin, Total: 2 g/dL (ref 1.5–4.5)
Glucose: 115 mg/dL — ABNORMAL HIGH (ref 70–99)
Potassium: 4.6 mmol/L (ref 3.5–5.2)
Sodium: 139 mmol/L (ref 134–144)
Total Protein: 6.1 g/dL (ref 6.0–8.5)
eGFR: 87 mL/min/{1.73_m2} (ref >59)

## 2023-09-14 LAB — HEMOGLOBIN A1C
Est. average glucose Bld gHb Est-mCnc: 157 mg/dL
Hgb A1c MFr Bld: 7.1 % — ABNORMAL HIGH (ref 4.8–5.6)

## 2023-09-14 LAB — LIPID PANEL
Chol/HDL Ratio: 3.9 ratio (ref 0.0–5.0)
Cholesterol, Total: 158 mg/dL (ref 100–199)
HDL: 41 mg/dL (ref >39)
LDL Chol Calc (NIH): 96 mg/dL (ref 0–99)
Triglycerides: 113 mg/dL (ref 0–149)
VLDL Cholesterol Cal: 21 mg/dL (ref 5–40)

## 2023-09-14 LAB — MICROALBUMIN / CREATININE URINE RATIO
Creatinine, Urine: 91 mg/dL
Microalb/Creat Ratio: 15 mg/g{creat} (ref 0–29)
Microalbumin, Urine: 13.5 ug/mL

## 2023-09-14 NOTE — Progress Notes (Signed)
 Please attest this visit in the absence of patient primary care provider.    Subjective:   Cameron Smith is a 82 y.o. male who presents for Medicare Annual/Subsequent preventive examination.  Visit Complete: Virtual I connected with  [name] on 09/14/23 by a audio enabled telemedicine application and verified that I am speaking with the correct person using two identifiers.  Patient Location: Home  Provider Location: Home Office  I discussed the limitations of evaluation and management by telemedicine. The patient expressed understanding and agreed to proceed.  Vital Signs: Because this visit was a virtual/telehealth visit, some criteria may be missing or patient reported. Any vitals not documented were not able to be obtained and vitals that have been documented are patient reported.  Patient Medicare AWV questionnaire was completed by the patient on na; I have confirmed that all information answered by patient is correct and no changes since this date.  Cardiac Risk Factors include: advanced age (>38men, >6 women);diabetes mellitus;dyslipidemia;hypertension;male gender;obesity (BMI >30kg/m2);sedentary lifestyle     Objective:    Today's Vitals   09/14/23 1140  Weight: 200 lb (90.7 kg)  Height: 5\' 8"  (1.727 m)   Body mass index is 30.41 kg/m.     09/14/2023   11:43 AM 08/30/2022   11:28 AM 04/19/2022    9:51 AM 08/02/2021    9:05 AM 08/22/2019    7:27 AM 10/11/2016    5:26 PM 10/31/2011    9:33 AM  Advanced Directives  Does Patient Have a Medical Advance Directive? No No No No No Yes Patient does not have advance directive;Patient would not like information  Type of Advance Directive      Living will;Healthcare Power of Attorney   Copy of Healthcare Power of Attorney in Chart?      No - copy requested   Would patient like information on creating a medical advance directive? No - Patient declined No - Patient declined No - Patient declined No - Patient declined No -  Patient declined      Current Medications (verified) Outpatient Encounter Medications as of 09/14/2023  Medication Sig   albuterol (VENTOLIN HFA) 108 (90 Base) MCG/ACT inhaler Inhale 1-2 puffs into the lungs every 6 (six) hours as needed for wheezing or shortness of breath.   amLODipine (NORVASC) 5 MG tablet TAKE 1 TABLET BY MOUTH DAILY   aspirin EC 81 MG tablet Take 81 mg by mouth every evening.   atorvastatin (LIPITOR) 80 MG tablet Take 1 tablet (80 mg total) by mouth daily.   Coenzyme Q10 200 MG capsule Take 200 mg by mouth every evening.   famotidine (PEPCID) 20 MG tablet One after supper   gabapentin (NEURONTIN) 100 MG capsule Take 1 capsule (100 mg total) by mouth at bedtime.   ipratropium (ATROVENT) 0.06 % nasal spray Place 2 sprays into both nostrils 4 (four) times daily as needed for rhinitis.   levothyroxine (SYNTHROID) 100 MCG tablet TAKE 1 TABLET BY MOUTH DAILY   meclizine (ANTIVERT) 25 MG tablet Take 1 tablet (25 mg total) by mouth 3 (three) times daily as needed for dizziness.   olmesartan (BENICAR) 40 MG tablet Take 1 tablet (40 mg total) by mouth daily.   pantoprazole (PROTONIX) 40 MG tablet Take 1 tablet (40 mg total) by mouth daily. Take 30-60 min before first meal of the day   Polyvinyl Alcohol-Povidone PF (REFRESH) 1.4-0.6 % SOLN Place 1 drop into both eyes daily as needed (dry eye).   vitamin E 400 UNIT  capsule Take 400 Units by mouth every evening.    No facility-administered encounter medications on file as of 09/14/2023.    Allergies (verified) Patient has no known allergies.   History: Past Medical History:  Diagnosis Date   Acute inflammation of nasal sinus    Chronic   Anxiety    Diverticulitis    Hypercholesteremia    Hypertension    Hypothyroidism    Nasal turbinate hypertrophy    Reactive airways dysfunction syndrome [facility])    Past Surgical History:  Procedure Laterality Date   BACK SURGERY     sciatic   COLONOSCOPY  10/31/2011   Adequate  prep, extensive left-sided diverticula, colon otherwise normal.   COLONOSCOPY WITH PROPOFOL N/A 04/19/2022   Procedure: COLONOSCOPY WITH PROPOFOL;  Surgeon: Corbin Ade, MD;  Location: [facility];  Service: Endoscopy;  Laterality: N/A;  11:15am   NASAL SEPTOPLASTY W/ TURBINOPLASTY Bilateral 08/22/2019   Procedure: Sinus Endoscopy   Nasal Septoplasty with Bilateral TURBINATE REDUCTION, Bilateral Total Ethmoidectomy and Sphenoidotomy;  Surgeon: Christia Reading, MD;  Location: [facility] OR;  Service: ENT;  Laterality: Bilateral;   NASAL SINUS SURGERY     Family History  Problem Relation Age of Onset   Heart disease Father    Diabetes Sister    Colon cancer Neg Hx    Colon polyps Neg Hx    Social History   Socioeconomic History   Marital status: Married    Spouse name: Not on file   Number of children: Not on file   Years of education: Not on file   Highest education level: Not on file  Occupational History   Not on file  Tobacco Use   Smoking status: Former    Current packs/day: 0.00    Average packs/day: 2.0 packs/day for 26.0 years (52.0 ttl pk-yrs)    Types: Cigarettes    Start date: 12/28/1957    Quit date: 11/29/1983    Years since quitting: 39.8   Smokeless tobacco: Never  Vaping Use   Vaping status: Never Used  Substance and Sexual Activity   Alcohol use: No   Drug use: No   Sexual activity: Not on file  Other Topics Concern   Not on file  Social History Narrative   Married x 60 years. 2 children, 4 grands, 4 great grands   Social Determinants of Health   Financial Resource Strain: Low Risk  (09/14/2023)   Overall Financial Resource Strain (CARDIA)    Difficulty of Paying Living Expenses: Not hard at all  Food Insecurity: No Food Insecurity (09/14/2023)   Hunger Vital Sign    Worried About Running Out of Food in the Last Year: Never true    Ran Out of Food in the Last Year: Never true  Transportation Needs: No Transportation Needs (09/14/2023)   PRAPARE -  Administrator, Civil Service (Medical): No    Lack of Transportation (Non-Medical): No  Physical Activity: Inactive (09/14/2023)   Exercise Vital Sign    Days of Exercise per Week: 0 days    Minutes of Exercise per Session: 0 min  Stress: No Stress Concern Present (09/14/2023)   [employer] of Occupational Health - Occupational Stress Questionnaire    Feeling of Stress : Not at all  Social Connections: Moderately Integrated (09/14/2023)   Social Connection and Isolation Panel [NHANES]    Frequency of Communication with Friends and Family: More than three times a week    Frequency of Social Gatherings with Friends and  Family: More than three times a week    Attends Religious Services: More than 4 times per year    Active Member of Clubs or Organizations: No    Attends Banker Meetings: Never    Marital Status: Married    Tobacco Counseling Counseling given: Yes   Clinical Intake:  Pre-visit preparation completed: Yes  Pain : No/denies pain     BMI - recorded: 30.41 Nutritional Status: BMI > 30  Obese Nutritional Risks: None Diabetes: Yes CBG done?: No (telehealth visit.) Did pt. bring in CBG monitor from home?: No  How often do you need to have someone help you when you read instructions, pamphlets, or other written materials from your doctor or pharmacy?: 1 - Never  Interpreter Needed?: No  Information entered by ::  Gianmarco Roye, CMA   Activities of Daily Living    09/14/2023   11:42 AM  In your present state of health, do you have any difficulty performing the following activities:  Hearing? 1  Comment wears hearing aids  Vision? 0  Comment Patient sees Dr. Arlyce Dice  Difficulty concentrating or making decisions? 0  Walking or climbing stairs? 0  Dressing or bathing? 0  Doing errands, shopping? 0  Preparing Food and eating ? N  Using the Toilet? N  In the past six months, have you accidently leaked urine? N  Do you  have problems with loss of bowel control? N  Managing your Medications? N  Managing your Finances? N  Housekeeping or managing your Housekeeping? N    Patient Care Team: Tommie Sams, DO as PCP - General (Family Medicine)  Indicate any recent Medical Services you may have received from other than [facility] providers in the past year (date may be approximate).     Assessment:   This is a routine wellness examination for [facility].  Hearing/Vision screen Hearing Screening - Comments:: Patient wears hearing aids. He is up to date with audiology Vision Screening - Comments:: Wears rx glasses - up to date with routine eye exams with Dr. Dione Booze in [place]   Goals Addressed             This Visit's Progress    Patient Stated       Catch more fish this coming year       Depression Screen    09/14/2023   11:45 AM 12/20/2022    8:26 AM 08/30/2022   11:27 AM 08/02/2021    9:01 AM 05/11/2021    9:36 AM 10/20/2020    2:57 PM 02/26/2018    8:47 AM  PHQ 2/9 Scores  PHQ - 2 Score 0 0 0 0 0 0 0  PHQ- 9 Score  0 0        Fall Risk    09/14/2023   11:44 AM 12/20/2022    8:26 AM 08/30/2022   11:29 AM 08/02/2021    9:06 AM 05/11/2021    9:35 AM  Fall Risk   Falls in the past year? 0 0 0 0 0  Number falls in past yr: 0 0 0 0 0  Injury with Fall? 0 0 0 0 0  Risk for fall due to : No Fall Risks No Fall Risks No Fall Risks No Fall Risks No Fall Risks  Follow up Falls prevention discussed Falls evaluation completed Falls prevention discussed;Falls evaluation completed Falls prevention discussed Falls evaluation completed    MEDICARE RISK AT HOME: Medicare Risk at Home Any stairs in or around the  home?: No If so, are there any without handrails?: No Home free of loose throw rugs in walkways, pet beds, electrical cords, etc?: Yes Adequate lighting in your home to reduce risk of falls?: Yes Life alert?: No Use of a cane, walker or w/c?: No Grab bars in the bathroom?: Yes Shower chair or  bench in shower?: Yes Elevated toilet seat or a handicapped toilet?: Yes  TIMED UP AND GO:  Was the test performed?  No    Cognitive Function:        09/14/2023   11:45 AM 08/30/2022   11:30 AM 08/02/2021    9:09 AM  6CIT Screen  What Year? 0 points 0 points 0 points  What month? 0 points 0 points 0 points  What time? 0 points 0 points 0 points  Count back from 20 0 points 0 points 0 points  Months in reverse 0 points 0 points 0 points  Repeat phrase 0 points 2 points 0 points  Total Score 0 points 2 points 0 points    Immunizations Immunization History  Administered Date(s) Administered   Fluad Quad(high Dose 65+) 09/19/2021, 09/21/2022   Influenza, High Dose Seasonal PF 09/01/2019   Influenza,inj,Quad PF,6+ Mos 08/04/2014, 08/04/2016, 08/13/2017, 08/23/2018   Influenza-Unspecified 09/11/2013, 09/10/2015, 09/02/2019, 09/28/2020, 08/20/2021   Moderna Sars-Covid-2 Vaccination 12/13/2019, 01/12/2020, 10/01/2020, 03/11/2021   PNEUMOCOCCAL CONJUGATE-20 06/14/2022   Pneumococcal Conjugate-13 09/18/2012   RSV,unspecified 11/12/2022   Zoster Recombinant(Shingrix) 07/26/2021, 08/20/2021   Zoster, Live 02/27/2008    TDAP status: Due, Education has been provided regarding the importance of this vaccine. Advised may receive this vaccine at local pharmacy or Health Dept. Aware to provide a copy of the vaccination record if obtained from local pharmacy or Health Dept. Verbalized acceptance and understanding.  Flu Vaccine status: Due, Education has been provided regarding the importance of this vaccine. Advised may receive this vaccine at local pharmacy or Health Dept. Aware to provide a copy of the vaccination record if obtained from local pharmacy or Health Dept. Verbalized acceptance and understanding.  Pneumococcal vaccine status: Up to date  Covid-19 vaccine status: Information provided on how to obtain vaccines.   Qualifies for Shingles Vaccine? Yes   Zostavax completed Yes    Shingrix Completed?: Yes  Screening Tests Health Maintenance  Topic Date Due   DTaP/Tdap/Td (1 - Tdap) Never done   FOOT EXAM  06/15/2023   INFLUENZA VACCINE  06/21/2023   COVID-19 Vaccine (5 - 2023-24 season) 07/22/2023   Medicare Annual Wellness (AWV)  08/31/2023   Diabetic kidney evaluation - Urine ACR  03/11/2024   HEMOGLOBIN A1C  03/13/2024   OPHTHALMOLOGY EXAM  06/26/2024   Diabetic kidney evaluation - eGFR measurement  09/12/2024   Pneumonia Vaccine 31+ Years old  Completed   Zoster Vaccines- Shingrix  Completed   HPV VACCINES  Aged Out    Health Maintenance  Health Maintenance Due  Topic Date Due   DTaP/Tdap/Td (1 - Tdap) Never done   FOOT EXAM  06/15/2023   INFLUENZA VACCINE  06/21/2023   COVID-19 Vaccine (5 - 2023-24 season) 07/22/2023   Medicare Annual Wellness (AWV)  08/31/2023    Colorectal cancer screening: No longer required.   Lung Cancer Screening: (Low Dose CT Chest recommended if Age 49-80 years, 20 pack-year currently smoking OR have quit w/in 15years.) does not qualify.   Lung Cancer Screening Referral: na  Additional Screening:  Hepatitis C Screening: does not qualify  Vision Screening: Recommended annual ophthalmology exams for early detection of glaucoma  and other disorders of the eye. Is the patient up to date with their annual eye exam?  Yes  Who is the provider or what is the name of the office in which the patient attends annual eye exams? Dr. Dione Booze in [place] If pt is not established with a provider, would they like to be referred to a provider to establish care? No .   Dental Screening: Recommended annual dental exams for proper oral hygiene  Diabetic Foot Exam: Diabetic Foot Exam: Overdue, Pt has been advised about the importance in completing this exam. Pt is scheduled for diabetic foot exam on provider made aware that patient is due.  Community Resource Referral / Chronic Care Management: CRR required this visit?  No   CCM  required this visit?  No     Plan:     I have personally reviewed and noted the following in the patient's chart:   Medical and social history Use of alcohol, tobacco or illicit drugs  Current medications and supplements including opioid prescriptions. Patient is not currently taking opioid prescriptions. Functional ability and status Nutritional status Physical activity Advanced directives List of other physicians Hospitalizations, surgeries, and ER visits in previous 12 months Vitals Screenings to include cognitive, depression, and falls Referrals and appointments  In addition, I have reviewed and discussed with patient certain preventive protocols, quality metrics, and best practice recommendations. A written personalized care plan for preventive services as well as general preventive health recommendations were provided to patient.     Jordan Hawks Peace Noyes, CMA   09/14/2023   After Visit Summary: (MyChart) Due to this being a telephonic visit, the after visit summary with patients personalized plan was offered to patient via MyChart   Nurse Notes: see routing comment

## 2023-09-14 NOTE — Patient Instructions (Signed)
[name] , Thank you for taking time to come for your Medicare Wellness Visit. I appreciate your ongoing commitment to your health goals. Please review the following plan we discussed and let me know if I can assist you in the future.   Referrals/Orders/Follow-Ups/Clinician Recommendations:  Next Medicare Annual Wellness Visit: September 19, 2024 at 10:40am  You are due for the vaccines checked below. You may have these done at your preferred pharmacy. Please have them fax the office proof of the vaccines so that we can update your chart.   [x]  Flu (due annually)  Recommended this fall either at PCP office or through your local pharmacy. The flu season starts August 1 of each year.   []  Shingrix (Shingles vaccine): CDC recommends 2 doses of Shingrix separated by 2-6 months for aged 2 years and older:  []  Pneumonia Vaccines: Recommended for adults 65 years or older  [x]  TDAP (Tetanus) Vaccine every 10 years:Recommended every 10 years; Please call your insurance company to determine your out of pocket expense. You also receive this vaccine at your local pharmacy or Health Dept.  [x]  Covid-19: Available now at any [facility] pharmacy (see info below)  You may also get your vaccines at any [facility] (locations listed below.) Vaccine hours are Monday - Friday 9:00 - 4:00. No appointments are required. Most insurances are accepted including Medicaid. Anyone can use the community pharmacies, and people are not required to have a [facility] provider.  Community Pharmacy Locations offering vaccines:   Sport and exercise psychologist   [facility] [place]  10 vaccines are offered at the [employer]: Covid, flu, Tdap, shingles, RSV, pneumonia, meningococcal, hepatitis A, hepatitis B, and HPV.    This is a list of the screening recommended for you and due dates:  Health Maintenance  Topic Date  Due   DTaP/Tdap/Td vaccine (1 - Tdap) Never done   Complete foot exam   06/15/2023   Flu Shot  06/21/2023   COVID-19 Vaccine (5 - 2023-24 season) 07/22/2023   Yearly kidney health urinalysis for diabetes  03/11/2024   Hemoglobin A1C  03/13/2024   Eye exam for diabetics  06/26/2024   Yearly kidney function blood test for diabetes  09/12/2024   Medicare Annual Wellness Visit  09/13/2024   Pneumonia Vaccine  Completed   Zoster (Shingles) Vaccine  Completed   HPV Vaccine  Aged Out    Advanced directives: (ACP Link)Information on Advanced Care Planning can be found at [facility] Advance Health Care Directives Advance Health Care Directives ([email or web address]   Next Medicare Annual Wellness Visit scheduled for next year: Yes  Preventive Care 65 Years and Older, Male Preventive care refers to lifestyle choices and visits with your health care provider that can promote health and wellness. Preventive care visits are also called wellness exams. What can I expect for my preventive care visit? Counseling During your preventive care visit, your health care provider may ask about your: Medical history, including: Past medical problems. Family medical history. History of falls. Current health, including: Emotional well-being. Home life and relationship well-being. Sexual activity. Memory and ability to understand (cognition). Lifestyle, including: Alcohol, nicotine or tobacco, and drug use. Access to firearms. Diet, exercise, and sleep habits. Work and work Astronomer. Sunscreen use. Safety issues such as seatbelt and bike helmet use. Physical exam Your health care provider will check your: Height and weight. These  may be used to calculate your BMI (body mass index). BMI is a measurement that tells if you are at a healthy weight. Waist circumference. This measures the distance around your waistline. This measurement also tells if you are at a healthy weight and may help  predict your risk of certain diseases, such as type 2 diabetes and high blood pressure. Heart rate and blood pressure. Body temperature. Skin for abnormal spots. What immunizations do I need?  Vaccines are usually given at various ages, according to a schedule. Your health care provider will recommend vaccines for you based on your age, medical history, and lifestyle or other factors, such as travel or where you work. What tests do I need? Screening Your health care provider may recommend screening tests for certain conditions. This may include: Lipid and cholesterol levels. Diabetes screening. This is done by checking your blood sugar (glucose) after you have not eaten for a while (fasting). Hepatitis C test. Hepatitis B test. HIV (human immunodeficiency virus) test. STI (sexually transmitted infection) testing, if you are at risk. Lung cancer screening. Colorectal cancer screening. Prostate cancer screening. Abdominal aortic aneurysm (AAA) screening. You may need this if you are a current or former smoker. Talk with your health care provider about your test results, treatment options, and if necessary, the need for more tests. Follow these instructions at home: Eating and drinking  Eat a diet that includes fresh fruits and vegetables, whole grains, lean protein, and low-fat dairy products. Limit your intake of foods with high amounts of sugar, saturated fats, and salt. Take vitamin and mineral supplements as recommended by your health care provider. Do not drink alcohol if your health care provider tells you not to drink. If you drink alcohol: Limit how much you have to 0-2 drinks a day. Know how much alcohol is in your drink. In the [place] one drink equals one 12 oz bottle of beer (355 mL), one 5 oz glass of wine (148 mL), or one 1 oz glass of hard liquor (44 mL). Lifestyle Brush your teeth every morning and night with fluoride toothpaste. Floss one time each day. Exercise for at  least 30 minutes 5 or more days each week. Do not use any products that contain nicotine or tobacco. These products include cigarettes, chewing tobacco, and vaping devices, such as e-cigarettes. If you need help quitting, ask your health care provider. Do not use drugs. If you are sexually active, practice safe sex. Use a condom or other form of protection to prevent STIs. Take aspirin only as told by your health care provider. Make sure that you understand how much to take and what form to take. Work with your health care provider to find out whether it is safe and beneficial for you to take aspirin daily. Ask your health care provider if you need to take a cholesterol-lowering medicine (statin). Find healthy ways to manage stress, such as: Meditation, yoga, or listening to music. Journaling. Talking to a trusted person. Spending time with friends and family. Safety Always wear your seat belt while driving or riding in a vehicle. Do not drive: If you have been drinking alcohol. Do not ride with someone who has been drinking. When you are tired or distracted. While texting. If you have been using any mind-altering substances or drugs. Wear a helmet and other protective equipment during sports activities. If you have firearms in your house, make sure you follow all gun safety procedures. Minimize exposure to UV radiation to reduce your risk  of skin cancer. What's next? Visit your health care provider once a year for an annual wellness visit. Ask your health care provider how often you should have your eyes and teeth checked. Stay up to date on all vaccines. This information is not intended to replace advice given to you by your health care provider. Make sure you discuss any questions you have with your health care provider. Document Revised: 05/04/2021 Document Reviewed: 05/04/2021 Elsevier Patient Education  2024 [employer]. Understanding Your Risk for Falls Millions of people have  serious injuries from falls each year. It is important to understand your risk of falling. Talk with your health care provider about your risk and what you can do to lower it. If you do have a serious fall, make sure to tell your provider. Falling once raises your risk of falling again. How can falls affect me? Serious injuries from falls are common. These include: Broken bones, such as hip fractures. Head injuries, such as traumatic brain injuries (TBI) or concussions. A fear of falling can cause you to avoid activities and stay at home. This can make your muscles weaker and raise your risk for a fall. What can increase my risk? There are a number of risk factors that increase your risk for falling. The more risk factors you have, the higher your risk of falling. Serious injuries from a fall happen most often to people who are older than 81 years old. Teenagers and young adults ages 4-29 are also at higher risk. Common risk factors include: Weakness in the lower body. Being generally weak or confused due to long-term (chronic) illness. Dizziness or balance problems. Poor vision. Medicines that cause dizziness or drowsiness. These may include: Medicines for your blood pressure, heart, anxiety, insomnia, or swelling (edema). Pain medicines. Muscle relaxants. Other risk factors include: Drinking alcohol. Having had a fall in the past. Having foot pain or wearing improper footwear. Working at a dangerous job. Having any of the following in your home: Tripping hazards, such as floor clutter or loose rugs. Poor lighting. Pets. Having dementia or memory loss. What actions can I take to lower my risk of falling?     Physical activity Stay physically fit. Do strength and balance exercises. Consider taking a regular class to build strength and balance. Yoga and tai chi are good options. Vision Have your eyes checked every year and your prescription for glasses or contacts updated as  needed. Shoes and walking aids Wear non-skid shoes. Wear shoes that have rubber soles and low heels. Do not wear high heels. Do not walk around the house in socks or slippers. Use a cane or walker as told by your provider. Home safety Attach secure railings on both sides of your stairs. Install grab bars for your bathtub, shower, and toilet. Use a non-skid mat in your bathtub or shower. Attach bath mats securely with double-sided, non-slip rug tape. Use good lighting in all rooms. Keep a flashlight near your bed. Make sure there is a clear path from your bed to the bathroom. Use night-lights. Do not use throw rugs. Make sure all carpeting is taped or tacked down securely. Remove all clutter from walkways and stairways, including extension cords. Repair uneven or broken steps and floors. Avoid walking on icy or slippery surfaces. Walk on the grass instead of on icy or slick sidewalks. Use ice melter to get rid of ice on walkways in the winter. Use a cordless phone. Questions to ask your health care provider Can you  help me check my risk for a fall? Do any of my medicines make me more likely to fall? Should I take a vitamin D supplement? What exercises can I do to improve my strength and balance? Should I make an appointment to have my vision checked? Do I need a bone density test to check for weak bones (osteoporosis)? Would it help to use a cane or a walker? Where to find more information Centers for Disease Control and Prevention, STEADI: TonerPromos.no Community-Based Fall Prevention Programs: TonerPromos.no [employer] on Aging: BaseRingTones.pl Contact a health care provider if: You fall at home. You are afraid of falling at home. You feel weak, drowsy, or dizzy. This information is not intended to replace advice given to you by your health care provider. Make sure you discuss any questions you have with your health care provider. Document Revised: 07/10/2022 Document Reviewed:  07/10/2022 Elsevier Patient Education  2024 [employer].

## 2023-09-19 ENCOUNTER — Ambulatory Visit (INDEPENDENT_AMBULATORY_CARE_PROVIDER_SITE_OTHER): Payer: Medicare Other | Admitting: Family Medicine

## 2023-09-19 VITALS — BP 142/70 | HR 73 | Ht 68.0 in | Wt 203.6 lb

## 2023-09-19 DIAGNOSIS — Z23 Encounter for immunization: Secondary | ICD-10-CM

## 2023-09-19 DIAGNOSIS — E119 Type 2 diabetes mellitus without complications: Secondary | ICD-10-CM | POA: Diagnosis not present

## 2023-09-19 DIAGNOSIS — R058 Other specified cough: Secondary | ICD-10-CM | POA: Diagnosis not present

## 2023-09-19 DIAGNOSIS — I10 Essential (primary) hypertension: Secondary | ICD-10-CM | POA: Diagnosis not present

## 2023-09-19 DIAGNOSIS — E782 Mixed hyperlipidemia: Secondary | ICD-10-CM

## 2023-09-19 MED ORDER — ALBUTEROL SULFATE HFA 108 (90 BASE) MCG/ACT IN AERS: 1.0000 | INHALATION_SPRAY | Freq: Four times a day (QID) | RESPIRATORY_TRACT | 3 refills | Status: AC | PRN

## 2023-09-19 NOTE — Progress Notes (Signed)
Subjective:  Patient ID: Cameron Smith, male    DOB: 01-Mar-1941  Age: 82 y.o. MRN: [id number]  CC:  Follow up   HPI:  82 year old hypertension, hypothyroidism, type 2 diabetes, hyperlipidemia, upper airway cough syndrome presents for follow-up.  Patient reports recent wheezing.  No shortness of breath.  Needs refill on albuterol.  He would like to discuss continuing Protonix and Pepcid versus discontinuation.  Please medications work prescribed/recommended by pulmonology for upper airway cough syndrome.  Hypertension is fairly well-controlled given his age.  He is on amlodipine 5 mg daily and olmesartan 40 mg daily.  Patient is on Lipitor 80 regarding his lipids.  Most recent LDL 96.  Would like better control given underlying type 2 diabetes.  Patient would like his flu shot today.  Patient Active Problem List   Diagnosis Date Noted   Upper airway cough syndrome vs cough variant asthma 03/14/2023   Type 2 diabetes, diet controlled (HCC) 12/15/2021   History of subdural hematoma (post traumatic) 04/17/2017   Essential hypertension 07/08/2013   Hyperlipidemia 07/08/2013   Hypothyroidism 07/08/2013    Social Hx   Social History   Socioeconomic History   Marital status: Married    Spouse name: Not on file   Number of children: Not on file   Years of education: Not on file   Highest education level: Not on file  Occupational History   Not on file  Tobacco Use   Smoking status: Former    Current packs/day: 0.00    Average packs/day: 2.0 packs/day for 26.0 years (52.0 ttl pk-yrs)    Types: Cigarettes    Start date: 12/28/1957    Quit date: 11/29/1983    Years since quitting: 39.8   Smokeless tobacco: Never  Vaping Use   Vaping status: Never Used  Substance and Sexual Activity   Alcohol use: No   Drug use: No   Sexual activity: Not on file  Other Topics Concern   Not on file  Social History Narrative   Married x 60 years. 2 children, 4 grands, 4 great grands   Social  Determinants of Health   Financial Resource Strain: Low Risk  (09/14/2023)   Overall Financial Resource Strain (CARDIA)    Difficulty of Paying Living Expenses: Not hard at all  Food Insecurity: No Food Insecurity (09/14/2023)   Hunger Vital Sign    Worried About Running Out of Food in the Last Year: Never true    Ran Out of Food in the Last Year: Never true  Transportation Needs: No Transportation Needs (09/14/2023)   PRAPARE - Administrator, Civil Service (Medical): No    Lack of Transportation (Non-Medical): No  Physical Activity: Inactive (09/14/2023)   Exercise Vital Sign    Days of Exercise per Week: 0 days    Minutes of Exercise per Session: 0 min  Stress: No Stress Concern Present (09/14/2023)   [employer] of Occupational Health - Occupational Stress Questionnaire    Feeling of Stress : Not at all  Social Connections: Moderately Integrated (09/14/2023)   Social Connection and Isolation Panel [NHANES]    Frequency of Communication with Friends and Family: More than three times a week    Frequency of Social Gatherings with Friends and Family: More than three times a week    Attends Religious Services: More than 4 times per year    Active Member of [employer] or Organizations: No    Attends Banker Meetings: Never  Marital Status: Married    Review of Systems Per HPI  Objective:  BP (!) 142/70   Pulse 73   Ht 5\' 8"  (1.727 m)   Wt 203 lb 9.6 oz (92.4 kg)   SpO2 95%   BMI 30.96 kg/m      09/19/2023    8:32 AM 09/14/2023   11:40 AM 05/07/2023    9:37 AM  BP/Weight  Systolic BP 142 -- 140  Diastolic BP 70 -- 70  Wt. (Lbs) 203.6 200 195  BMI 30.96 kg/m2 30.41 kg/m2 29.65 kg/m2    Physical Exam Constitutional:      General: He is not in acute distress.    Appearance: Normal appearance.  Eyes:     General:        Right eye: No discharge.     Conjunctiva/sclera: Conjunctivae normal.  Cardiovascular:     Rate and Rhythm: Normal  rate and regular rhythm.  Pulmonary:     Effort: Pulmonary effort is normal.     Breath sounds: Normal breath sounds. No wheezing or rales.  Feet:     Comments: Diabetic Foot Check -  Appearance - no lesions, ulcers or calluses Skin - no unusual pallor or redness Monofilament testing -  Right - Great toe, medial, central, lateral ball and posterior foot intact Left - Great toe, medial, central, lateral ball and posterior foot intact  Neurological:     Mental Status: He is alert.  Psychiatric:        Mood and Affect: Mood normal.        Behavior: Behavior normal.     Lab Results  Component Value Date   WBC 8.2 09/13/2023   HGB 15.2 09/13/2023   HCT 46.0 09/13/2023   PLT 223 09/13/2023   GLUCOSE 115 (H) 09/13/2023   CHOL 158 09/13/2023   TRIG 113 09/13/2023   HDL 41 09/13/2023   LDLCALC 96 09/13/2023   ALT 26 09/13/2023   AST 18 09/13/2023   NA 139 09/13/2023   K 4.6 09/13/2023   CL 102 09/13/2023   CREATININE 0.87 09/13/2023   BUN 19 09/13/2023   CO2 26 09/13/2023   TSH 1.310 12/18/2022   PSA 0.44 01/15/2015   INR 0.92 12/20/2009   HGBA1C 7.1 (H) 09/13/2023     Assessment & Plan:   Problem List Items Addressed This Visit       Cardiovascular and Mediastinum   Essential hypertension - Primary    Stable.  Continue olmesartan and amlodipine.        Endocrine   Type 2 diabetes, diet controlled (HCC)    Most recent A1c 7.1.  No medications at this time.  Given advanced age, his control is acceptable.  Will continue to monitor.        Other   Upper airway cough syndrome vs cough variant asthma    Albuterol refilled.  Discussed with patient that if he feels that he has had benefit from Protonix and Pepcid he may continue.  If not he may discontinue.      Hyperlipidemia    Continue Lipitor.  Watch diet.  Will consider addition of Zetia in the future.       Meds ordered this encounter  Medications   albuterol (VENTOLIN HFA) 108 (90 Base) MCG/ACT  inhaler    Sig: Inhale 1-2 puffs into the lungs every 6 (six) hours as needed for wheezing or shortness of breath.    Dispense:  18 g    Refill:  3    Follow-up:  Return in about 6 months (around 03/19/2024).  Everlene Other DO [facility]

## 2023-09-19 NOTE — Assessment & Plan Note (Signed)
Albuterol refilled.  Discussed with patient that if he feels that he has had benefit from Protonix and Pepcid he may continue.  If not he may discontinue.

## 2023-09-19 NOTE — Assessment & Plan Note (Signed)
Stable.  Continue olmesartan and amlodipine.

## 2023-09-19 NOTE — Assessment & Plan Note (Signed)
Continue Lipitor.  Watch diet.  Will consider addition of Zetia in the future.

## 2023-09-19 NOTE — Assessment & Plan Note (Signed)
Most recent A1c 7.1.  No medications at this time.  Given advanced age, his control is acceptable.  Will continue to monitor.

## 2023-09-19 NOTE — Patient Instructions (Signed)
Albuterol sent in.  Continue your medications.  Follow up in 6 months.

## 2023-09-20 NOTE — Progress Notes (Signed)
I agree with the management as indicated in the note.  Peityn Payton DO [facility]  

## 2023-09-23 ENCOUNTER — Other Ambulatory Visit: Payer: Self-pay | Admitting: Internal Medicine

## 2023-09-23 DIAGNOSIS — R058 Other specified cough: Secondary | ICD-10-CM

## 2023-10-01 ENCOUNTER — Telehealth: Payer: Self-pay

## 2023-10-01 NOTE — Telephone Encounter (Signed)
Reason for CRM: Barbara Cower, with [facility], is calling on behalf of pt. States pt is on levothyroxin 100mg  and they are needing an approval for manufacturer change. Barbara Cower states the manufacturer is changing from Amneal to Lupin. Would like a call back at [phone]. Ref #: [id number].   Called [facility] and spoke with Pharmacy confirmed manufacture change is okay

## 2023-10-02 NOTE — Telephone Encounter (Signed)
Copied from CRM [id number]. Topic: Clinical - Prescription Issue >> Oct 01, 2023  4:26 PM Prudencio Pair wrote: Reason for CRM: Barbara Cower, with [facility], is calling on behalf of pt. States pt is on levothyroxin 100mg  and they are needing an approval for manufacturer change. Barbara Cower states the manufacturer is changing from Amneal to Lupin. Would like a call back at [phone]. Ref #: [id number].  Please advise

## 2023-10-02 NOTE — Telephone Encounter (Signed)
Informed [facility] per drs recommendations.

## 2023-10-04 DIAGNOSIS — J441 Chronic obstructive pulmonary disease with (acute) exacerbation: Secondary | ICD-10-CM | POA: Diagnosis not present

## 2023-10-25 ENCOUNTER — Other Ambulatory Visit: Payer: Self-pay | Admitting: Family Medicine

## 2023-10-25 MED ORDER — BREZTRI AEROSPHERE 160-9-4.8 MCG/ACT IN AERO: 2.0000 | INHALATION_SPRAY | Freq: Two times a day (BID) | RESPIRATORY_TRACT | 11 refills | Status: DC

## 2023-11-03 DIAGNOSIS — J441 Chronic obstructive pulmonary disease with (acute) exacerbation: Secondary | ICD-10-CM | POA: Diagnosis not present

## 2023-12-04 DIAGNOSIS — J441 Chronic obstructive pulmonary disease with (acute) exacerbation: Secondary | ICD-10-CM | POA: Diagnosis not present

## 2023-12-06 ENCOUNTER — Other Ambulatory Visit: Payer: Self-pay | Admitting: Family Medicine

## 2023-12-06 DIAGNOSIS — I10 Essential (primary) hypertension: Secondary | ICD-10-CM

## 2023-12-23 ENCOUNTER — Other Ambulatory Visit: Payer: Self-pay | Admitting: Internal Medicine

## 2024-01-04 DIAGNOSIS — J441 Chronic obstructive pulmonary disease with (acute) exacerbation: Secondary | ICD-10-CM | POA: Diagnosis not present

## 2024-02-01 DIAGNOSIS — J441 Chronic obstructive pulmonary disease with (acute) exacerbation: Secondary | ICD-10-CM | POA: Diagnosis not present

## 2024-03-03 DIAGNOSIS — J441 Chronic obstructive pulmonary disease with (acute) exacerbation: Secondary | ICD-10-CM | POA: Diagnosis not present

## 2024-03-10 ENCOUNTER — Other Ambulatory Visit: Payer: Self-pay | Admitting: Family Medicine

## 2024-03-10 ENCOUNTER — Telehealth: Payer: Self-pay | Admitting: Family Medicine

## 2024-03-10 DIAGNOSIS — E119 Type 2 diabetes mellitus without complications: Secondary | ICD-10-CM

## 2024-03-10 DIAGNOSIS — E782 Mixed hyperlipidemia: Secondary | ICD-10-CM

## 2024-03-10 DIAGNOSIS — E039 Hypothyroidism, unspecified: Secondary | ICD-10-CM

## 2024-03-10 MED ORDER — BREZTRI AEROSPHERE 160-9-4.8 MCG/ACT IN AERO
2.0000 | INHALATION_SPRAY | Freq: Two times a day (BID) | RESPIRATORY_TRACT | 11 refills | Status: AC
Start: 2024-03-10 — End: ?

## 2024-03-10 NOTE — Telephone Encounter (Unsigned)
 Copied from CRM [id number]. Topic: Clinical - Request for Lab/Test Order >> Mar 10, 2024  8:32 AM [name] wrote: Reason for CRM: Please request labs for patient for his appointment on 03/21/2024. Callback number is [phone]

## 2024-03-17 DIAGNOSIS — E782 Mixed hyperlipidemia: Secondary | ICD-10-CM | POA: Diagnosis not present

## 2024-03-17 DIAGNOSIS — E039 Hypothyroidism, unspecified: Secondary | ICD-10-CM | POA: Diagnosis not present

## 2024-03-17 DIAGNOSIS — E119 Type 2 diabetes mellitus without complications: Secondary | ICD-10-CM | POA: Diagnosis not present

## 2024-03-19 LAB — CMP14+EGFR
ALT: 21 IU/L (ref 0–44)
AST: 17 IU/L (ref 0–40)
Albumin: 4.4 g/dL (ref 3.7–4.7)
Alkaline Phosphatase: 75 IU/L (ref 44–121)
BUN/Creatinine Ratio: 18 (ref 10–24)
BUN: 17 mg/dL (ref 8–27)
Bilirubin Total: 0.5 mg/dL (ref 0.0–1.2)
CO2: 26 mmol/L (ref 20–29)
Calcium: 9.5 mg/dL (ref 8.6–10.2)
Chloride: 104 mmol/L (ref 96–106)
Creatinine, Ser: 0.97 mg/dL (ref 0.76–1.27)
Globulin, Total: 1.8 g/dL (ref 1.5–4.5)
Glucose: 93 mg/dL (ref 70–99)
Potassium: 4.4 mmol/L (ref 3.5–5.2)
Sodium: 140 mmol/L (ref 134–144)
Total Protein: 6.2 g/dL (ref 6.0–8.5)
eGFR: 78 mL/min/{1.73_m2} (ref >59)

## 2024-03-19 LAB — LIPID PANEL
Chol/HDL Ratio: 3.1 ratio (ref 0.0–5.0)
Cholesterol, Total: 129 mg/dL (ref 100–199)
HDL: 41 mg/dL (ref >39)
LDL Chol Calc (NIH): 74 mg/dL (ref 0–99)
Triglycerides: 70 mg/dL (ref 0–149)
VLDL Cholesterol Cal: 14 mg/dL (ref 5–40)

## 2024-03-19 LAB — MICROALBUMIN / CREATININE URINE RATIO
Creatinine, Urine: 144.8 mg/dL
Microalb/Creat Ratio: 13 mg/g{creat} (ref 0–29)
Microalbumin, Urine: 18.3 ug/mL

## 2024-03-19 LAB — TSH: TSH: 1.71 u[IU]/mL (ref 0.450–4.500)

## 2024-03-19 LAB — HEMOGLOBIN A1C
Est. average glucose Bld gHb Est-mCnc: 146 mg/dL
Hgb A1c MFr Bld: 6.7 % — ABNORMAL HIGH (ref 4.8–5.6)

## 2024-03-20 ENCOUNTER — Other Ambulatory Visit: Payer: Self-pay | Admitting: Family Medicine

## 2024-03-21 ENCOUNTER — Ambulatory Visit (INDEPENDENT_AMBULATORY_CARE_PROVIDER_SITE_OTHER): Payer: Medicare Other | Admitting: Family Medicine

## 2024-03-21 ENCOUNTER — Other Ambulatory Visit (HOSPITAL_COMMUNITY): Payer: Self-pay

## 2024-03-21 ENCOUNTER — Other Ambulatory Visit: Payer: Self-pay

## 2024-03-21 ENCOUNTER — Telehealth: Payer: Self-pay | Admitting: Pharmacy Technician

## 2024-03-21 VITALS — BP 138/72 | Ht 68.0 in | Wt 193.6 lb

## 2024-03-21 DIAGNOSIS — E1159 Type 2 diabetes mellitus with other circulatory complications: Secondary | ICD-10-CM | POA: Diagnosis not present

## 2024-03-21 DIAGNOSIS — N529 Male erectile dysfunction, unspecified: Secondary | ICD-10-CM

## 2024-03-21 DIAGNOSIS — E119 Type 2 diabetes mellitus without complications: Secondary | ICD-10-CM

## 2024-03-21 DIAGNOSIS — E039 Hypothyroidism, unspecified: Secondary | ICD-10-CM

## 2024-03-21 DIAGNOSIS — E782 Mixed hyperlipidemia: Secondary | ICD-10-CM

## 2024-03-21 DIAGNOSIS — I10 Essential (primary) hypertension: Secondary | ICD-10-CM | POA: Diagnosis not present

## 2024-03-21 MED ORDER — AMLODIPINE BESYLATE 5 MG PO TABS: 5.0000 mg | ORAL_TABLET | Freq: Every day | ORAL | 2 refills | Status: DC

## 2024-03-21 MED ORDER — TADALAFIL 10 MG PO TABS: 10.0000 mg | ORAL_TABLET | Freq: Every day | ORAL | 11 refills | Status: DC | PRN

## 2024-03-21 MED ORDER — LEVOTHYROXINE SODIUM 100 MCG PO TABS: 100.0000 ug | ORAL_TABLET | Freq: Every day | ORAL | 2 refills | Status: DC

## 2024-03-21 NOTE — Assessment & Plan Note (Signed)
 Stable on current dose of levothyroxine

## 2024-03-21 NOTE — Progress Notes (Signed)
 Subjective:  Patient ID: Cameron Smith, male    DOB: 1941-06-22  Age: 83 y.o. MRN: [id number]  CC:   Chief Complaint  Patient presents with   Hypertension    Follow up    HPI:  83 year old male with above-mentioned medical problems presents for follow-up.  Patient is doing well.  He does note that he is having some issues with erections.  He would like to discuss medications for erectile dysfunction.  Patient's A1c is improved and at goal.  Hypothyroidism stable on levothyroxine .  Hypertension stable on Benicar  and amlodipine .  Patient Active Problem List   Diagnosis Date Noted   Erectile dysfunction 03/21/2024   Upper airway cough syndrome vs cough variant asthma 03/14/2023   Type 2 diabetes, diet controlled (HCC) 12/15/2021   History of subdural hematoma (post traumatic) 04/17/2017   Essential hypertension 07/08/2013   Hyperlipidemia 07/08/2013   Hypothyroidism 07/08/2013    Social Hx   Social History   Socioeconomic History   Marital status: Married    Spouse name: Not on file   Number of children: Not on file   Years of education: Not on file   Highest education level: Not on file  Occupational History   Not on file  Tobacco Use   Smoking status: Former    Current packs/day: 0.00    Average packs/day: 2.0 packs/day for 26.0 years (52.0 ttl pk-yrs)    Types: Cigarettes    Start date: 12/28/1957    Quit date: 11/29/1983    Years since quitting: 40.3   Smokeless tobacco: Never  Vaping Use   Vaping status: Never Used  Substance and Sexual Activity   Alcohol use: No   Drug use: No   Sexual activity: Not on file  Other Topics Concern   Not on file  Social History Narrative   Married x 60 years. 2 children, 4 grands, 4 great grands   Social Drivers of Health   Financial Resource Strain: Low Risk  (09/14/2023)   Overall Financial Resource Strain (CARDIA)    Difficulty of Paying Living Expenses: Not hard at all  Food Insecurity: No Food Insecurity  (09/14/2023)   Hunger Vital Sign    Worried About Running Out of Food in the Last Year: Never true    Ran Out of Food in the Last Year: Never true  Transportation Needs: No Transportation Needs (09/14/2023)   PRAPARE - Administrator, Civil Service (Medical): No    Lack of Transportation (Non-Medical): No  Physical Activity: Inactive (09/14/2023)   Exercise Vital Sign    Days of Exercise per Week: 0 days    Minutes of Exercise per Session: 0 min  Stress: No Stress Concern Present (09/14/2023)   [employer] of Occupational Health - Occupational Stress Questionnaire    Feeling of Stress : Not at all  Social Connections: Moderately Integrated (09/14/2023)   Social Connection and Isolation Panel [NHANES]    Frequency of Communication with Friends and Family: More than three times a week    Frequency of Social Gatherings with Friends and Family: More than three times a week    Attends Religious Services: More than 4 times per year    Active Member of [employer] or Organizations: No    Attends Banker Meetings: Never    Marital Status: Married    Review of Systems Per HPI  Objective:  BP 138/72   Ht 5\' 8"  (1.727 m)   Wt 193 lb 9.6 oz (87.8  kg)   BMI 29.44 kg/m      03/21/2024    8:31 AM 09/19/2023    8:32 AM 09/14/2023   11:40 AM  BP/Weight  Systolic BP 138 142 --  Diastolic BP 72 70 --  Wt. (Lbs) 193.6 203.6 200  BMI 29.44 kg/m2 30.96 kg/m2 30.41 kg/m2    Physical Exam Vitals and nursing note reviewed.  Constitutional:      General: He is not in acute distress.    Appearance: Normal appearance.  HENT:     Head: Normocephalic and atraumatic.  Eyes:     General:        Right eye: No discharge.        Left eye: No discharge.     Conjunctiva/sclera: Conjunctivae normal.  Cardiovascular:     Rate and Rhythm: Normal rate and regular rhythm.  Pulmonary:     Effort: Pulmonary effort is normal.     Breath sounds: Normal breath sounds. No  wheezing, rhonchi or rales.  Neurological:     Mental Status: He is alert.  Psychiatric:        Mood and Affect: Mood normal.        Behavior: Behavior normal.     Lab Results  Component Value Date   WBC 8.2 09/13/2023   HGB 15.2 09/13/2023   HCT 46.0 09/13/2023   PLT 223 09/13/2023   GLUCOSE 93 03/17/2024   CHOL 129 03/17/2024   TRIG 70 03/17/2024   HDL 41 03/17/2024   LDLCALC 74 03/17/2024   ALT 21 03/17/2024   AST 17 03/17/2024   NA 140 03/17/2024   K 4.4 03/17/2024   CL 104 03/17/2024   CREATININE 0.97 03/17/2024   BUN 17 03/17/2024   CO2 26 03/17/2024   TSH 1.710 03/17/2024   PSA 0.44 01/15/2015   INR 0.92 12/20/2009   HGBA1C 6.7 (H) 03/17/2024     Assessment & Plan:  Type 2 diabetes, diet controlled (HCC) Assessment & Plan: Well-controlled.  Diet controlled.  Will continue to monitor closely.   Essential hypertension Assessment & Plan: Stable.  Continue Benicar  and amlodipine .   Hypothyroidism, unspecified type Assessment & Plan: Stable on current dose of levothyroxine .   Mixed hyperlipidemia Assessment & Plan: Fair control on Lipitor.  Most recent LDL 74.  Continue atorvastatin .   Erectile dysfunction, unspecified erectile dysfunction type Assessment & Plan: Trial of Cialis.  Orders: -     Tadalafil; Take 1 tablet (10 mg total) by mouth daily as needed for erectile dysfunction.  Dispense: 10 tablet; Refill: 11    Follow-up: 6 months  Tiffeny Minchew Debrah Fan DO [facility]

## 2024-03-21 NOTE — Assessment & Plan Note (Signed)
 Stable.  Continue Benicar  and amlodipine .

## 2024-03-21 NOTE — Assessment & Plan Note (Signed)
 Fair control on Lipitor.  Most recent LDL 74.  Continue atorvastatin .

## 2024-03-21 NOTE — Assessment & Plan Note (Signed)
Trial of Cialis. °

## 2024-03-21 NOTE — Assessment & Plan Note (Signed)
 Well-controlled.  Diet controlled.  Will continue to monitor closely.

## 2024-03-21 NOTE — Patient Instructions (Signed)
Continue your medication.  Follow-up in 6 months  Take care  Dr. Cook  

## 2024-03-21 NOTE — Telephone Encounter (Signed)
 Pharmacy Patient Advocate Encounter   Received notification from CoverMyMeds that prior authorization for Tadalafil 10MG  tablets is required/requested.   Insurance verification completed.   The patient is insured through [employer] .   Per test claim: drug not covered under Part D. No PA will be obtained for medication for indicated use.

## 2024-04-01 ENCOUNTER — Other Ambulatory Visit: Payer: Self-pay | Admitting: Family Medicine

## 2024-04-09 DIAGNOSIS — R059 Cough, unspecified: Secondary | ICD-10-CM | POA: Diagnosis not present

## 2024-04-09 DIAGNOSIS — J22 Unspecified acute lower respiratory infection: Secondary | ICD-10-CM | POA: Diagnosis not present

## 2024-04-28 DIAGNOSIS — J22 Unspecified acute lower respiratory infection: Secondary | ICD-10-CM | POA: Diagnosis not present

## 2024-04-28 DIAGNOSIS — R059 Cough, unspecified: Secondary | ICD-10-CM | POA: Diagnosis not present

## 2024-05-16 DIAGNOSIS — H2513 Age-related nuclear cataract, bilateral: Secondary | ICD-10-CM | POA: Diagnosis not present

## 2024-05-16 DIAGNOSIS — H35033 Hypertensive retinopathy, bilateral: Secondary | ICD-10-CM | POA: Diagnosis not present

## 2024-05-16 DIAGNOSIS — H40033 Anatomical narrow angle, bilateral: Secondary | ICD-10-CM | POA: Diagnosis not present

## 2024-05-16 DIAGNOSIS — H40013 Open angle with borderline findings, low risk, bilateral: Secondary | ICD-10-CM | POA: Diagnosis not present

## 2024-05-16 DIAGNOSIS — H43813 Vitreous degeneration, bilateral: Secondary | ICD-10-CM | POA: Diagnosis not present

## 2024-05-16 DIAGNOSIS — H02834 Dermatochalasis of left upper eyelid: Secondary | ICD-10-CM | POA: Diagnosis not present

## 2024-05-16 DIAGNOSIS — E119 Type 2 diabetes mellitus without complications: Secondary | ICD-10-CM | POA: Diagnosis not present

## 2024-05-16 DIAGNOSIS — H02831 Dermatochalasis of right upper eyelid: Secondary | ICD-10-CM | POA: Diagnosis not present

## 2024-05-16 LAB — HM DIABETES EYE EXAM

## 2024-05-26 DIAGNOSIS — D1801 Hemangioma of skin and subcutaneous tissue: Secondary | ICD-10-CM | POA: Diagnosis not present

## 2024-05-26 DIAGNOSIS — L720 Epidermal cyst: Secondary | ICD-10-CM | POA: Diagnosis not present

## 2024-05-26 DIAGNOSIS — L309 Dermatitis, unspecified: Secondary | ICD-10-CM | POA: Diagnosis not present

## 2024-05-26 DIAGNOSIS — L578 Other skin changes due to chronic exposure to nonionizing radiation: Secondary | ICD-10-CM | POA: Diagnosis not present

## 2024-05-26 DIAGNOSIS — L821 Other seborrheic keratosis: Secondary | ICD-10-CM | POA: Diagnosis not present

## 2024-05-26 DIAGNOSIS — L570 Actinic keratosis: Secondary | ICD-10-CM | POA: Diagnosis not present

## 2024-07-30 ENCOUNTER — Other Ambulatory Visit: Payer: Self-pay | Admitting: Family Medicine

## 2024-07-30 NOTE — Telephone Encounter (Unsigned)
 Copied from CRM #[id number]. Topic: Clinical - Medication Refill >> Jul 30, 2024  3:10 PM Wess S wrote: Medication: meclizine  (ANTIVERT ) 25 MG tablet   Has the patient contacted their pharmacy? No (Agent: If no, request that the patient contact the pharmacy for the refill. If patient does not wish to contact the pharmacy document the reason why and proceed with request.) (Agent: If yes, when and what did the pharmacy advise?)  This is the patient's preferred pharmacy:  [facility] [place], [place] - [id number] [place] [address] Phone: [phone] Fax: [phone]  Is this the correct pharmacy for this prescription? Yes If no, delete pharmacy and type the correct one.   Has the prescription been filled recently? No  Is the patient out of the medication? Yes  Has the patient been seen for an appointment in the last year OR does the patient have an upcoming appointment? Yes  Can we respond through MyChart? Yes  Agent: Please be advised that Rx refills may take up to 3 business days. We ask that you follow-up with your pharmacy.

## 2024-07-30 NOTE — Telephone Encounter (Signed)
 FYI Only or Action Required?: Action required by provider: medication refill request.  Patient was last seen in primary care on 03/21/2024 by Cook, Jayce G, DO.  Called Nurse Triage reporting No chief complaint on file..  Symptoms began today.  Interventions attempted: Nothing.  Symptoms are: stable.  Triage Disposition: No disposition on file.  Patient/caregiver understands and will follow disposition?:

## 2024-08-01 ENCOUNTER — Other Ambulatory Visit: Payer: Self-pay | Admitting: Nurse Practitioner

## 2024-08-01 ENCOUNTER — Ambulatory Visit: Payer: Self-pay

## 2024-08-01 MED ORDER — MECLIZINE HCL 25 MG PO TABS: 25.0000 mg | ORAL_TABLET | Freq: Three times a day (TID) | ORAL | 0 refills | Status: AC | PRN

## 2024-08-01 NOTE — Telephone Encounter (Signed)
 Wife called in for a refill of patient's Meclizine  on Wednesday and states he started having dizziness then (2 days ago) She is very frustrated at this time  FYI Only or Action Required?: Action required by provider: update on patient condition and refill of meclizine  is requested.  Patient was last seen in primary care on 03/21/2024 by Cook, Jayce G, DO.  Called Nurse Triage reporting Dizziness.  Symptoms began 2 days ago.  Interventions attempted: Rest, hydration, or home remedies.  Symptoms are: mild per wife.  Triage Disposition: No disposition on file.  Patient/caregiver understands and will follow disposition?: No, wishes to speak with PCP          Copied from CRM [id number]. Topic: Clinical - Red Word Triage >> Aug 01, 2024 10:41 AM Charlet HERO wrote: Red Word that prompted transfer to Nurse Triage: Patient wife is calling bc he has had bad vertigo and he is extermley dizzy called to have med called in wed see refil request but it has not been sent to pharm. meclizine  (ANTIVERT ) 25 MG tablet. Answer Assessment - Initial Assessment Questions Wife called Wednesday for a refill for meclizine  Two days ago his symptoms started Wife states he has vertigo twice a year Patient is out in the yard picking up pecans under the pecan tree Wife states he is not in any pain  Wife very frustrated and didn't want to answer a lot of questions  [facility] [address] [place], [place]  This RN asked if it was possible to speak to the patient for further assessment and she states that he is outside and she is unable to go downstairs and get him.  She attempted to get his attention from the patio but wasn't able to and was very frustrated   This RN advised patient's wife to call us  back at any time with any further questions She is also advised that if his dizziness is severe for him to go to the Emergency Room---She states that he does not need to go to the  Emergency Room he is having a mild case of vertigo and needs this medication  Protocols used: Dizziness - Vertigo-A-AH

## 2024-08-01 NOTE — Telephone Encounter (Signed)
 Patient notified and verbalized understanding.

## 2024-08-01 NOTE — Telephone Encounter (Signed)
 Called [facility] to inform them of this situation

## 2024-08-01 NOTE — Telephone Encounter (Signed)
 Original message from 2 days ago was sent to wrong PCP office and was not received by this office- this is the first request our office is receiving for this

## 2024-08-09 ENCOUNTER — Encounter (HOSPITAL_COMMUNITY): Payer: Self-pay | Admitting: Emergency Medicine

## 2024-08-09 ENCOUNTER — Inpatient Hospital Stay (HOSPITAL_COMMUNITY)
Admission: EM | Admit: 2024-08-09 | Discharge: 2024-08-10 | DRG: 439 | Disposition: A | Discharge status: Other Acute Inpatient Hospital | Attending: Family Medicine | Admitting: Family Medicine

## 2024-08-09 ENCOUNTER — Other Ambulatory Visit: Payer: Self-pay

## 2024-08-09 DIAGNOSIS — Z79890 Hormone replacement therapy (postmenopausal): Secondary | ICD-10-CM

## 2024-08-09 DIAGNOSIS — R0789 Other chest pain: Secondary | ICD-10-CM | POA: Diagnosis not present

## 2024-08-09 DIAGNOSIS — E782 Mixed hyperlipidemia: Secondary | ICD-10-CM | POA: Diagnosis not present

## 2024-08-09 DIAGNOSIS — J45909 Unspecified asthma, uncomplicated: Secondary | ICD-10-CM | POA: Diagnosis not present

## 2024-08-09 DIAGNOSIS — Z79899 Other long term (current) drug therapy: Secondary | ICD-10-CM | POA: Diagnosis not present

## 2024-08-09 DIAGNOSIS — Z7982 Long term (current) use of aspirin: Secondary | ICD-10-CM | POA: Diagnosis not present

## 2024-08-09 DIAGNOSIS — K8020 Calculus of gallbladder without cholecystitis without obstruction: Secondary | ICD-10-CM | POA: Diagnosis not present

## 2024-08-09 DIAGNOSIS — K219 Gastro-esophageal reflux disease without esophagitis: Secondary | ICD-10-CM | POA: Diagnosis not present

## 2024-08-09 DIAGNOSIS — E039 Hypothyroidism, unspecified: Secondary | ICD-10-CM | POA: Diagnosis not present

## 2024-08-09 DIAGNOSIS — Z87891 Personal history of nicotine dependence: Secondary | ICD-10-CM

## 2024-08-09 DIAGNOSIS — K2980 Duodenitis without bleeding: Secondary | ICD-10-CM | POA: Diagnosis present

## 2024-08-09 DIAGNOSIS — E8809 Other disorders of plasma-protein metabolism, not elsewhere classified: Secondary | ICD-10-CM | POA: Diagnosis not present

## 2024-08-09 DIAGNOSIS — Z833 Family history of diabetes mellitus: Secondary | ICD-10-CM

## 2024-08-09 DIAGNOSIS — E119 Type 2 diabetes mellitus without complications: Secondary | ICD-10-CM | POA: Diagnosis not present

## 2024-08-09 DIAGNOSIS — I10 Essential (primary) hypertension: Secondary | ICD-10-CM | POA: Diagnosis present

## 2024-08-09 DIAGNOSIS — J683 Other acute and subacute respiratory conditions due to chemicals, gases, fumes and vapors: Secondary | ICD-10-CM | POA: Diagnosis not present

## 2024-08-09 DIAGNOSIS — Z6828 Body mass index (BMI) 28.0-28.9, adult: Secondary | ICD-10-CM

## 2024-08-09 DIAGNOSIS — E441 Mild protein-calorie malnutrition: Secondary | ICD-10-CM | POA: Diagnosis not present

## 2024-08-09 DIAGNOSIS — K851 Biliary acute pancreatitis without necrosis or infection: Principal | ICD-10-CM | POA: Diagnosis present

## 2024-08-09 DIAGNOSIS — J9811 Atelectasis: Secondary | ICD-10-CM | POA: Diagnosis present

## 2024-08-09 DIAGNOSIS — K859 Acute pancreatitis without necrosis or infection, unspecified: Secondary | ICD-10-CM | POA: Diagnosis not present

## 2024-08-09 DIAGNOSIS — Z8249 Family history of ischemic heart disease and other diseases of the circulatory system: Secondary | ICD-10-CM | POA: Diagnosis not present

## 2024-08-09 DIAGNOSIS — E46 Unspecified protein-calorie malnutrition: Secondary | ICD-10-CM | POA: Insufficient documentation

## 2024-08-09 DIAGNOSIS — F419 Anxiety disorder, unspecified: Secondary | ICD-10-CM | POA: Diagnosis present

## 2024-08-09 DIAGNOSIS — R1013 Epigastric pain: Secondary | ICD-10-CM | POA: Diagnosis not present

## 2024-08-09 DIAGNOSIS — R7401 Elevation of levels of liver transaminase levels: Secondary | ICD-10-CM | POA: Insufficient documentation

## 2024-08-09 DIAGNOSIS — R058 Other specified cough: Secondary | ICD-10-CM

## 2024-08-09 NOTE — ED Triage Notes (Signed)
 Pt c/o upper abd pain/ substernal chest pain that started about 7pm. Pt states the pain has been constant and hasn't changed since it started. Denies cardiac hx.

## 2024-08-09 NOTE — ED Provider Notes (Incomplete)
 [place] EMERGENCY DEPARTMENT AT [facility] Provider Note   CSN: [id number] Arrival date & time: 08/09/24  2327     Patient presents with: Chest Pain   Cameron Smith is a 83 y.o. male.  {Add pertinent medical, surgical, social history, OB history to YEP:67052} The history is provided by the patient.  Chest Pain  He has history of hypertension, diabetes, hyperlipidemia, hypothyroidism, diverticulitis    Prior to Admission medications   Medication Sig Start Date End Date Taking? Authorizing Provider  albuterol  (VENTOLIN  HFA) 108 (90 Base) MCG/ACT inhaler Inhale 1-2 puffs into the lungs every 6 (six) hours as needed for wheezing or shortness of breath. 09/19/23   Cook, Jayce G, DO  amLODipine  (NORVASC ) 5 MG tablet TAKE 1 TABLET BY MOUTH DAILY 04/01/24   Cook, Jayce G, DO  aspirin  EC 81 MG tablet Take 81 mg by mouth every evening.    [provider]  atorvastatin  (LIPITOR) 80 MG tablet TAKE 1 TABLET BY MOUTH DAILY 12/06/23   Cook, Jayce G, DO  budeson-glycopyrrolate -formoterol  (BREZTRI  AEROSPHERE) 160-9-4.8 MCG/ACT AERO inhaler Inhale 2 puffs into the lungs 2 (two) times daily. 03/10/24   Cook, Jayce G, DO  Coenzyme Q10 200 MG capsule Take 200 mg by mouth every evening.    [provider]  famotidine  (PEPCID ) 20 MG tablet One after supper 04/23/23   Darlean Ozell NOVAK, MD  gabapentin  (NEURONTIN ) 100 MG capsule Take 1 capsule (100 mg total) by mouth at bedtime. 05/07/23   Harrison, Stanley E, MD  ipratropium (ATROVENT ) 0.06 % nasal spray Place 2 sprays into both nostrils 4 (four) times daily as needed for rhinitis. 02/02/23   Cook, Jayce G, DO  levothyroxine  (SYNTHROID ) 100 MCG tablet TAKE 1 TABLET BY MOUTH DAILY 04/01/24   Cook, Jayce G, DO  meclizine  (ANTIVERT ) 25 MG tablet Take 1 tablet (25 mg total) by mouth 3 (three) times daily as needed for dizziness. 08/01/24   Hoskins, Carolyn C, NP  olmesartan  (BENICAR ) 40 MG tablet TAKE 1 TABLET BY MOUTH DAILY 12/24/23   Wert,  Michael B, MD  pantoprazole  (PROTONIX ) 40 MG tablet Take 1 tablet (40 mg total) by mouth daily. Take 30-60 min before first meal of the day. PLEASE SCHEDULE OFFICE VISIT 09/24/23   Darlean Ozell NOVAK, MD  Polyvinyl Alcohol -Povidone PF (REFRESH) 1.4-0.6 % SOLN Place 1 drop into both eyes daily as needed (dry eye).    [provider]  tadalafil  (CIALIS ) 10 MG tablet Take 1 tablet (10 mg total) by mouth daily as needed for erectile dysfunction. 03/21/24   Cook, Jayce G, DO  vitamin E 400 UNIT capsule Take 400 Units by mouth every evening.     [provider]    Allergies: Patient has no known allergies.    Review of Systems  Cardiovascular:  Positive for chest pain.  All other systems reviewed and are negative.   Updated Vital Signs BP (!) 119/43   Pulse 70   Temp 98.1 F (36.7 C) (Oral)   Resp 20   Ht 5' 8 (1.727 m)   Wt 85.3 kg   SpO2 96%   BMI 28.59 kg/m   Physical Exam Vitals and nursing note reviewed.   83 year old male, resting comfortably and in no acute distress. Vital signs are ***. Oxygen saturation is ***%, which is normal. Head is normocephalic and atraumatic. PERRLA, EOMI. Oropharynx is clear. Neck is nontender and supple without adenopathy or JVD. Back is nontender and there is no  CVA tenderness. Lungs are clear without rales, wheezes, or rhonchi. Chest is nontender. Heart has regular rate and rhythm without murmur. Abdomen is soft, flat, nontender without masses or hepatosplenomegaly and peristalsis is normoactive. Extremities have no cyanosis or edema, full range of motion is present. Skin is warm and dry without rash. Neurologic: Mental status is normal, cranial nerves are intact, there are no motor or sensory deficits.  (all labs ordered are listed, but only abnormal results are displayed) Labs Reviewed  BASIC METABOLIC PANEL WITH GFR  CBC  TROPONIN I (HIGH SENSITIVITY)    EKG: None  Radiology: No results found.  {Document cardiac  monitor, telemetry assessment procedure when appropriate:32947} Procedures   Medications Ordered in the ED - No data to display    {Click here for ABCD2, HEART and other calculators REFRESH Note before signing:1}                              Medical Decision Making Amount and/or Complexity of Data Reviewed Labs: ordered. Radiology: ordered.   ***  {Document critical care time when appropriate  Document review of labs and clinical decision tools ie CHADS2VASC2, etc  Document your independent review of radiology images and any outside records  Document your discussion with family members, caretakers and with consultants  Document social determinants of health affecting pt's care  Document your decision making why or why not admission, treatments were needed:32947:::1}   Final diagnoses:  None    ED Discharge Orders     None

## 2024-08-09 NOTE — ED Provider Notes (Signed)
 [place] EMERGENCY DEPARTMENT AT [facility] Provider Note   CSN: [id number] Arrival date & time: 08/09/24  2327     Patient presents with: Chest Pain   Cameron Smith is a 83 y.o. male.   The history is provided by the patient.  Chest Pain  He has history of hypertension, diabetes, hyperlipidemia, hypothyroidism, diverticulitis comes in because of epigastric pain since 7 PM.  Pain started after he ate some jelly toast.  There has been associated nausea and vomiting.  He denies dyspnea.  He did have 1 brief episode of diaphoresis.  Pain does not radiate.  He describes it as a bad stomachache.  He is a former smoker and has no cardiac history.    Prior to Admission medications   Medication Sig Start Date End Date Taking? Authorizing Provider  albuterol  (VENTOLIN  HFA) 108 (90 Base) MCG/ACT inhaler Inhale 1-2 puffs into the lungs every 6 (six) hours as needed for wheezing or shortness of breath. 09/19/23   Cook, Jayce G, DO  amLODipine  (NORVASC ) 5 MG tablet TAKE 1 TABLET BY MOUTH DAILY 04/01/24   Cook, Jayce G, DO  aspirin  EC 81 MG tablet Take 81 mg by mouth every evening.    [provider]  atorvastatin  (LIPITOR) 80 MG tablet TAKE 1 TABLET BY MOUTH DAILY 12/06/23   Cook, Jayce G, DO  budeson-glycopyrrolate -formoterol  (BREZTRI  AEROSPHERE) 160-9-4.8 MCG/ACT AERO inhaler Inhale 2 puffs into the lungs 2 (two) times daily. 03/10/24   Cook, Jayce G, DO  Coenzyme Q10 200 MG capsule Take 200 mg by mouth every evening.    [provider]  famotidine  (PEPCID ) 20 MG tablet One after supper 04/23/23   Darlean Ozell NOVAK, MD  gabapentin  (NEURONTIN ) 100 MG capsule Take 1 capsule (100 mg total) by mouth at bedtime. 05/07/23   Harrison, Stanley E, MD  ipratropium (ATROVENT ) 0.06 % nasal spray Place 2 sprays into both nostrils 4 (four) times daily as needed for rhinitis. 02/02/23   Cook, Jayce G, DO  levothyroxine  (SYNTHROID ) 100 MCG tablet TAKE 1 TABLET BY MOUTH DAILY 04/01/24   Cook,  Jayce G, DO  meclizine  (ANTIVERT ) 25 MG tablet Take 1 tablet (25 mg total) by mouth 3 (three) times daily as needed for dizziness. 08/01/24   Hoskins, Carolyn C, NP  olmesartan  (BENICAR ) 40 MG tablet TAKE 1 TABLET BY MOUTH DAILY 12/24/23   Wert, Michael B, MD  pantoprazole  (PROTONIX ) 40 MG tablet Take 1 tablet (40 mg total) by mouth daily. Take 30-60 min before first meal of the day. PLEASE SCHEDULE OFFICE VISIT 09/24/23   Darlean Ozell NOVAK, MD  Polyvinyl Alcohol -Povidone PF (REFRESH) 1.4-0.6 % SOLN Place 1 drop into both eyes daily as needed (dry eye).    [provider]  tadalafil  (CIALIS ) 10 MG tablet Take 1 tablet (10 mg total) by mouth daily as needed for erectile dysfunction. 03/21/24   Cook, Jayce G, DO  vitamin E 400 UNIT capsule Take 400 Units by mouth every evening.     [provider]    Allergies: Patient has no known allergies.    Review of Systems  Cardiovascular:  Positive for chest pain.  All other systems reviewed and are negative.   Updated Vital Signs BP (!) 119/43   Pulse 70   Temp 98.1 F (36.7 C) (Oral)   Resp 20   Ht 5' 8 (1.727 m)   Wt 85.3 kg   SpO2 96%   BMI 28.59 kg/m   Physical Exam Vitals  and nursing note reviewed.   83 year old male, resting comfortably and in no acute distress. Vital signs are normal. Oxygen saturation is 96%, which is normal. Head is normocephalic and atraumatic. PERRLA, EOMI. Oropharynx is clear. Neck is nontender and supple without adenopathy. Lungs are clear without rales, wheezes, or rhonchi. Chest is nontender. Heart has regular rate and rhythm without murmur. Abdomen is soft, flat, with moderate epigastric and right upper quadrant tenderness and a +/- Murphy sign.  There is no rebound or guarding. Extremities have no cyanosis or edema, full range of motion is present. Skin is warm and dry without rash. Neurologic: Mental status is normal, cranial nerves are intact, moves all extremities equally.  (all labs  ordered are listed, but only abnormal results are displayed) Labs Reviewed  BASIC METABOLIC PANEL WITH GFR  CBC  TROPONIN I (HIGH SENSITIVITY)    EKG: EKG Interpretation Date/Time:  Sunday August 10 2024 00:01:18 EDT Ventricular Rate:  69 PR Interval:  171 QRS Duration:  112 QT Interval:  389 QTC Calculation: 417 R Axis:   -32  Text Interpretation: Sinus rhythm Incomplete RBBB and LAFB When compared with ECG of 08/22/2019, No significant change was found Confirmed by Raford Lenis ([id number]) on 08/10/2024 12:04:55 AM  Radiology: No results found.   Procedures   Medications Ordered in the ED - No data to display                                  Medical Decision Making Amount and/or Complexity of Data Reviewed Labs: ordered. Radiology: ordered.  Risk Prescription drug management. Decision regarding hospitalization.   Epigastric pain.  This is a presentation with a wide range of treatment options and carries with it a high risk of morbidity and complications.  Differential diagnosis includes, but is not limited to, gastritis, cholecystitis, pancreatitis, diverticulitis, ACS.  I have reviewed his electrocardiogram, and my interpretation is incomplete right bundle branch block and left anterior fascicular block unchanged from prior.  No ST or T changes.  I have ordered laboratory workup and also intravenous morphine  and ondansetron .  I have reviewed his past records, and I see no prior cardiac evaluation.  CT of abdomen and pelvis on 10/09/2019 showed no gallstones  Chest x-ray shows left basilar atelectasis.  I have independently viewed the image, and agree with the radiologist's interpretation.  I have reviewed his laboratory tests, and my interpretation is normal troponin, mild leukocytosis which is nonspecific, mildly elevated transaminases and alkaline phosphatase and bilirubin, markedly elevated lipase.  I am strongly suspicious of gallstone pancreatitis.  I have ordered CT of  abdomen and pelvis.  CT scan shows acute uncomplicated pancreatitis with suspected secondary duodenitis and cholelithiasis.  Have independently viewed the images, and agree with the radiologist's interpretation.  I have discussed the case with Dr. Stacia of gastroenterology service who states that workup should consist of MRCP and only proceed to ERCP if choledocholithiasis is demonstrated.  I have discussed the case with Dr. Manfred of [facility], who agrees to admit the patient.     Final diagnoses:  Gallstone pancreatitis    ED Discharge Orders     None          Raford Lenis, MD 08/10/24 [phone]

## 2024-08-10 ENCOUNTER — Inpatient Hospital Stay (HOSPITAL_COMMUNITY)

## 2024-08-10 ENCOUNTER — Other Ambulatory Visit (HOSPITAL_COMMUNITY)

## 2024-08-10 ENCOUNTER — Encounter (HOSPITAL_COMMUNITY): Payer: Self-pay

## 2024-08-10 ENCOUNTER — Inpatient Hospital Stay
Admission: AD | Admit: 2024-08-10 | Discharge: 2024-08-14 | DRG: 417 | Disposition: A | Discharge status: Home / Self Care | Source: Other Acute Inpatient Hospital | Attending: Hospitalist | Admitting: Hospitalist

## 2024-08-10 ENCOUNTER — Emergency Department (HOSPITAL_COMMUNITY)

## 2024-08-10 ENCOUNTER — Encounter: Payer: Self-pay | Admitting: Internal Medicine

## 2024-08-10 DIAGNOSIS — Z8509 Personal history of malignant neoplasm of other digestive organs: Secondary | ICD-10-CM | POA: Diagnosis not present

## 2024-08-10 DIAGNOSIS — Z79890 Hormone replacement therapy (postmenopausal): Secondary | ICD-10-CM | POA: Diagnosis not present

## 2024-08-10 DIAGNOSIS — E039 Hypothyroidism, unspecified: Secondary | ICD-10-CM

## 2024-08-10 DIAGNOSIS — J683 Other acute and subacute respiratory conditions due to chemicals, gases, fumes and vapors: Secondary | ICD-10-CM | POA: Insufficient documentation

## 2024-08-10 DIAGNOSIS — J45909 Unspecified asthma, uncomplicated: Secondary | ICD-10-CM | POA: Diagnosis present

## 2024-08-10 DIAGNOSIS — Z79899 Other long term (current) drug therapy: Secondary | ICD-10-CM | POA: Diagnosis not present

## 2024-08-10 DIAGNOSIS — K5730 Diverticulosis of large intestine without perforation or abscess without bleeding: Secondary | ICD-10-CM | POA: Diagnosis not present

## 2024-08-10 DIAGNOSIS — K2980 Duodenitis without bleeding: Secondary | ICD-10-CM | POA: Diagnosis present

## 2024-08-10 DIAGNOSIS — Z723 Lack of physical exercise: Secondary | ICD-10-CM

## 2024-08-10 DIAGNOSIS — Z833 Family history of diabetes mellitus: Secondary | ICD-10-CM

## 2024-08-10 DIAGNOSIS — K859 Acute pancreatitis without necrosis or infection, unspecified: Principal | ICD-10-CM | POA: Diagnosis present

## 2024-08-10 DIAGNOSIS — K8042 Calculus of bile duct with acute cholecystitis without obstruction: Secondary | ICD-10-CM | POA: Diagnosis not present

## 2024-08-10 DIAGNOSIS — I10 Essential (primary) hypertension: Secondary | ICD-10-CM

## 2024-08-10 DIAGNOSIS — R7401 Elevation of levels of liver transaminase levels: Secondary | ICD-10-CM | POA: Diagnosis not present

## 2024-08-10 DIAGNOSIS — K851 Biliary acute pancreatitis without necrosis or infection: Principal | ICD-10-CM | POA: Diagnosis present

## 2024-08-10 DIAGNOSIS — Z7982 Long term (current) use of aspirin: Secondary | ICD-10-CM | POA: Diagnosis not present

## 2024-08-10 DIAGNOSIS — R1011 Right upper quadrant pain: Secondary | ICD-10-CM | POA: Diagnosis present

## 2024-08-10 DIAGNOSIS — Z6828 Body mass index (BMI) 28.0-28.9, adult: Secondary | ICD-10-CM | POA: Diagnosis not present

## 2024-08-10 DIAGNOSIS — R072 Precordial pain: Secondary | ICD-10-CM | POA: Diagnosis not present

## 2024-08-10 DIAGNOSIS — Z87891 Personal history of nicotine dependence: Secondary | ICD-10-CM

## 2024-08-10 DIAGNOSIS — F419 Anxiety disorder, unspecified: Secondary | ICD-10-CM | POA: Diagnosis present

## 2024-08-10 DIAGNOSIS — Z7951 Long term (current) use of inhaled steroids: Secondary | ICD-10-CM | POA: Diagnosis not present

## 2024-08-10 DIAGNOSIS — R1010 Upper abdominal pain, unspecified: Secondary | ICD-10-CM | POA: Diagnosis not present

## 2024-08-10 DIAGNOSIS — K831 Obstruction of bile duct: Principal | ICD-10-CM | POA: Diagnosis present

## 2024-08-10 DIAGNOSIS — E782 Mixed hyperlipidemia: Secondary | ICD-10-CM | POA: Diagnosis not present

## 2024-08-10 DIAGNOSIS — K8050 Calculus of bile duct without cholangitis or cholecystitis without obstruction: Secondary | ICD-10-CM

## 2024-08-10 DIAGNOSIS — J9811 Atelectasis: Secondary | ICD-10-CM | POA: Diagnosis present

## 2024-08-10 DIAGNOSIS — K810 Acute cholecystitis: Secondary | ICD-10-CM | POA: Diagnosis not present

## 2024-08-10 DIAGNOSIS — E46 Unspecified protein-calorie malnutrition: Secondary | ICD-10-CM | POA: Diagnosis not present

## 2024-08-10 DIAGNOSIS — Z8249 Family history of ischemic heart disease and other diseases of the circulatory system: Secondary | ICD-10-CM | POA: Diagnosis not present

## 2024-08-10 DIAGNOSIS — E119 Type 2 diabetes mellitus without complications: Secondary | ICD-10-CM | POA: Diagnosis not present

## 2024-08-10 DIAGNOSIS — K219 Gastro-esophageal reflux disease without esophagitis: Secondary | ICD-10-CM | POA: Diagnosis present

## 2024-08-10 DIAGNOSIS — E8809 Other disorders of plasma-protein metabolism, not elsewhere classified: Secondary | ICD-10-CM

## 2024-08-10 DIAGNOSIS — K8070 Calculus of gallbladder and bile duct without cholecystitis without obstruction: Secondary | ICD-10-CM | POA: Diagnosis not present

## 2024-08-10 DIAGNOSIS — E441 Mild protein-calorie malnutrition: Secondary | ICD-10-CM | POA: Diagnosis present

## 2024-08-10 DIAGNOSIS — R918 Other nonspecific abnormal finding of lung field: Secondary | ICD-10-CM | POA: Diagnosis not present

## 2024-08-10 DIAGNOSIS — F17210 Nicotine dependence, cigarettes, uncomplicated: Secondary | ICD-10-CM | POA: Diagnosis not present

## 2024-08-10 DIAGNOSIS — K8020 Calculus of gallbladder without cholecystitis without obstruction: Secondary | ICD-10-CM | POA: Diagnosis not present

## 2024-08-10 LAB — COMPREHENSIVE METABOLIC PANEL WITH GFR
ALT: 92 U/L — ABNORMAL HIGH (ref 0–44)
AST: 89 U/L — ABNORMAL HIGH (ref 15–41)
Albumin: 3.4 g/dL — ABNORMAL LOW (ref 3.5–5.0)
Alkaline Phosphatase: 134 U/L — ABNORMAL HIGH (ref 38–126)
Anion gap: 11 (ref 5–15)
BUN: 26 mg/dL — ABNORMAL HIGH (ref 8–23)
CO2: 25 mmol/L (ref 22–32)
Calcium: 9.2 mg/dL (ref 8.9–10.3)
Chloride: 105 mmol/L (ref 98–111)
Creatinine, Ser: 0.85 mg/dL (ref 0.61–1.24)
GFR, Estimated: >60 mL/min (ref >60)
Glucose, Bld: 136 mg/dL — ABNORMAL HIGH (ref 70–99)
Potassium: 3.5 mmol/L (ref 3.5–5.1)
Sodium: 141 mmol/L (ref 135–145)
Total Bilirubin: 1.5 mg/dL — ABNORMAL HIGH (ref 0.0–1.2)
Total Protein: 5.9 g/dL — ABNORMAL LOW (ref 6.5–8.1)

## 2024-08-10 LAB — CBC WITH DIFFERENTIAL/PLATELET
Abs Immature Granulocytes: 0.03 K/uL (ref 0.00–0.07)
Basophils Absolute: 0 K/uL (ref 0.0–0.1)
Basophils Relative: 0 %
Eosinophils Absolute: 0.1 K/uL (ref 0.0–0.5)
Eosinophils Relative: 0 %
HCT: 40.1 % (ref 39.0–52.0)
Hemoglobin: 13.4 g/dL (ref 13.0–17.0)
Immature Granulocytes: 0 %
Lymphocytes Relative: 6 %
Lymphs Abs: 0.8 K/uL (ref 0.7–4.0)
MCH: 32.7 pg (ref 26.0–34.0)
MCHC: 33.4 g/dL (ref 30.0–36.0)
MCV: 97.8 fL (ref 80.0–100.0)
Monocytes Absolute: 0.4 K/uL (ref 0.1–1.0)
Monocytes Relative: 3 %
Neutro Abs: 12.1 K/uL — ABNORMAL HIGH (ref 1.7–7.7)
Neutrophils Relative %: 91 %
Platelets: 253 K/uL (ref 150–400)
RBC: 4.1 MIL/uL — ABNORMAL LOW (ref 4.22–5.81)
RDW: 12.6 % (ref 11.5–15.5)
WBC: 13.4 K/uL — ABNORMAL HIGH (ref 4.0–10.5)
nRBC: 0 % (ref 0.0–0.2)

## 2024-08-10 LAB — TROPONIN I (HIGH SENSITIVITY)
Troponin I (High Sensitivity): 10 ng/L (ref <18)
Troponin I (High Sensitivity): 11 ng/L (ref <18)

## 2024-08-10 LAB — LIPASE, BLOOD: Lipase: >2800 U/L — ABNORMAL HIGH (ref 11–51)

## 2024-08-10 LAB — CREATININE, SERUM
Creatinine, Ser: 0.8 mg/dL (ref 0.61–1.24)
GFR, Estimated: >60 mL/min (ref >60)

## 2024-08-10 LAB — MAGNESIUM: Magnesium: 1.9 mg/dL (ref 1.7–2.4)

## 2024-08-10 LAB — GLUCOSE, CAPILLARY: Glucose-Capillary: 104 mg/dL — ABNORMAL HIGH (ref 70–99)

## 2024-08-10 LAB — PHOSPHORUS: Phosphorus: 2.1 mg/dL — ABNORMAL LOW (ref 2.5–4.6)

## 2024-08-10 MED ORDER — HYDRALAZINE HCL 20 MG/ML IJ SOLN: 5.0000 mg | Freq: Four times a day (QID) | INTRAMUSCULAR | Status: DC | PRN

## 2024-08-10 MED ORDER — LEVOTHYROXINE SODIUM 100 MCG PO TABS
100.0000 ug | ORAL_TABLET | Freq: Every day | ORAL | Status: DC
Start: 2024-08-10 — End: 2024-08-10
  Administered 2024-08-10: 100 ug via ORAL
  Filled 2024-08-10: qty 2

## 2024-08-10 MED ORDER — ONDANSETRON HCL 4 MG/2ML IJ SOLN: 4.0000 mg | Freq: Four times a day (QID) | INTRAMUSCULAR | Status: DC | PRN

## 2024-08-10 MED ORDER — POTASSIUM PHOSPHATES 15 MMOLE/5ML IV SOLN
15.0000 mmol | Freq: Once | INTRAVENOUS | Status: DC
  Administered 2024-08-10: 15 mmol via INTRAVENOUS
  Filled 2024-08-10: qty 5

## 2024-08-10 MED ORDER — ASPIRIN 81 MG PO TBEC
81.0000 mg | DELAYED_RELEASE_TABLET | Freq: Every day | ORAL | Status: DC
  Administered 2024-08-14: 81 mg via ORAL
  Filled 2024-08-10 (×3): qty 1

## 2024-08-10 MED ORDER — GABAPENTIN 100 MG PO CAPS
100.0000 mg | ORAL_CAPSULE | Freq: Every day | ORAL | Status: DC
Start: 2024-08-10 — End: 2024-08-14
  Administered 2024-08-10 – 2024-08-13 (×4): 100 mg via ORAL
  Filled 2024-08-10 (×4): qty 1

## 2024-08-10 MED ORDER — PANTOPRAZOLE SODIUM 40 MG IV SOLR: 40.0000 mg | Freq: Two times a day (BID) | INTRAVENOUS | Status: DC

## 2024-08-10 MED ORDER — PANTOPRAZOLE SODIUM 40 MG PO TBEC
40.0000 mg | DELAYED_RELEASE_TABLET | Freq: Two times a day (BID) | ORAL | Status: DC
  Administered 2024-08-10 – 2024-08-14 (×6): 40 mg via ORAL
  Filled 2024-08-10 (×6): qty 1

## 2024-08-10 MED ORDER — LACTATED RINGERS IV SOLN: INTRAVENOUS | Status: DC

## 2024-08-10 MED ORDER — ENOXAPARIN SODIUM 40 MG/0.4ML IJ SOSY
40.0000 mg | PREFILLED_SYRINGE | INTRAMUSCULAR | Status: DC
  Administered 2024-08-11 – 2024-08-13 (×2): 40 mg via SUBCUTANEOUS
  Filled 2024-08-10 (×3): qty 0.4

## 2024-08-10 MED ORDER — ONDANSETRON HCL 4 MG PO TABS: 4.0000 mg | ORAL_TABLET | Freq: Four times a day (QID) | ORAL | Status: DC | PRN

## 2024-08-10 MED ORDER — INSULIN ASPART 100 UNIT/ML IJ SOLN
0.0000 [IU] | Freq: Three times a day (TID) | INTRAMUSCULAR | Status: DC
  Administered 2024-08-12 (×2): 3 [IU] via SUBCUTANEOUS
  Administered 2024-08-13: 5 [IU] via SUBCUTANEOUS
  Administered 2024-08-14: 2 [IU] via SUBCUTANEOUS
  Filled 2024-08-10 (×4): qty 1

## 2024-08-10 MED ORDER — IPRATROPIUM BROMIDE 0.06 % NA SOLN: 2.0000 | Freq: Four times a day (QID) | NASAL | Status: DC | PRN

## 2024-08-10 MED ORDER — ENOXAPARIN SODIUM 40 MG/0.4ML IJ SOSY
40.0000 mg | PREFILLED_SYRINGE | Freq: Once | INTRAMUSCULAR | Status: AC
  Administered 2024-08-10: 40 mg via SUBCUTANEOUS
  Filled 2024-08-10: qty 0.4

## 2024-08-10 MED ORDER — MORPHINE SULFATE (PF) 2 MG/ML IV SOLN: 2.0000 mg | INTRAVENOUS | Status: DC | PRN

## 2024-08-10 MED ORDER — MORPHINE SULFATE (PF) 4 MG/ML IV SOLN
4.0000 mg | Freq: Once | INTRAVENOUS | Status: AC
  Administered 2024-08-10: 4 mg via INTRAVENOUS
  Filled 2024-08-10: qty 1

## 2024-08-10 MED ORDER — BUDESON-GLYCOPYRROL-FORMOTEROL 160-9-4.8 MCG/ACT IN AERO
2.0000 | INHALATION_SPRAY | Freq: Two times a day (BID) | RESPIRATORY_TRACT | Status: DC
  Administered 2024-08-10: 2 via RESPIRATORY_TRACT
  Filled 2024-08-10: qty 5.9

## 2024-08-10 MED ORDER — PANTOPRAZOLE SODIUM 40 MG PO TBEC: 40.0000 mg | DELAYED_RELEASE_TABLET | Freq: Two times a day (BID) | ORAL | Status: DC

## 2024-08-10 MED ORDER — ENOXAPARIN SODIUM 40 MG/0.4ML IJ SOSY: 40.0000 mg | PREFILLED_SYRINGE | INTRAMUSCULAR | Status: DC

## 2024-08-10 MED ORDER — GLUCERNA SHAKE PO LIQD
237.0000 mL | Freq: Three times a day (TID) | ORAL | Status: DC
  Administered 2024-08-10 (×2): 237 mL via ORAL

## 2024-08-10 MED ORDER — PANTOPRAZOLE SODIUM 40 MG IV SOLR
40.0000 mg | INTRAVENOUS | Status: DC
Start: 2024-08-10 — End: 2024-08-10

## 2024-08-10 MED ORDER — ONDANSETRON HCL 4 MG/2ML IJ SOLN
4.0000 mg | Freq: Once | INTRAMUSCULAR | Status: AC
  Administered 2024-08-10: 4 mg via INTRAVENOUS
  Filled 2024-08-10: qty 2

## 2024-08-10 MED ORDER — METRONIDAZOLE 500 MG/100ML IV SOLN
500.0000 mg | Freq: Two times a day (BID) | INTRAVENOUS | Status: DC
  Administered 2024-08-10 – 2024-08-13 (×7): 500 mg via INTRAVENOUS
  Filled 2024-08-10 (×8): qty 100

## 2024-08-10 MED ORDER — POLYVINYL ALCOHOL 1.4 % OP SOLN: 1.0000 [drp] | OPHTHALMIC | Status: DC | PRN

## 2024-08-10 MED ORDER — ALBUTEROL SULFATE HFA 108 (90 BASE) MCG/ACT IN AERS
1.0000 | INHALATION_SPRAY | Freq: Four times a day (QID) | RESPIRATORY_TRACT | Status: DC | PRN
Start: 2024-08-10 — End: 2024-08-10

## 2024-08-10 MED ORDER — LEVOTHYROXINE SODIUM 50 MCG PO TABS
100.0000 ug | ORAL_TABLET | Freq: Every day | ORAL | Status: DC
  Administered 2024-08-11 – 2024-08-14 (×3): 100 ug via ORAL
  Filled 2024-08-10 (×3): qty 2

## 2024-08-10 MED ORDER — ASPIRIN EC 81 MG PO TBEC: 81.0000 mg | DELAYED_RELEASE_TABLET | Freq: Every day | ORAL | 11 refills | Status: AC

## 2024-08-10 MED ORDER — ALBUTEROL SULFATE (2.5 MG/3ML) 0.083% IN NEBU
2.5000 mg | INHALATION_SOLUTION | Freq: Four times a day (QID) | RESPIRATORY_TRACT | Status: DC
  Filled 2024-08-10 (×2): qty 3

## 2024-08-10 MED ORDER — ACETAMINOPHEN 325 MG PO TABS: 650.0000 mg | ORAL_TABLET | Freq: Four times a day (QID) | ORAL | Status: DC | PRN

## 2024-08-10 MED ORDER — SODIUM CHLORIDE 0.9 % IV SOLN
2.0000 g | INTRAVENOUS | Status: DC
  Administered 2024-08-10 – 2024-08-13 (×4): 2 g via INTRAVENOUS
  Filled 2024-08-10 (×4): qty 20

## 2024-08-10 MED ORDER — ACETAMINOPHEN 650 MG RE SUPP: 650.0000 mg | Freq: Four times a day (QID) | RECTAL | Status: DC | PRN

## 2024-08-10 MED ORDER — MECLIZINE HCL 25 MG PO TABS: 25.0000 mg | ORAL_TABLET | Freq: Three times a day (TID) | ORAL | Status: DC | PRN

## 2024-08-10 MED ORDER — IOHEXOL 300 MG/ML  SOLN
100.0000 mL | Freq: Once | INTRAMUSCULAR | Status: AC | PRN
  Administered 2024-08-10: 100 mL via INTRAVENOUS

## 2024-08-10 MED ORDER — POLYVINYL ALCOHOL-POVIDONE PF 1.4-0.6 % OP SOLN: 1.0000 [drp] | Freq: Every day | OPHTHALMIC | Status: DC | PRN

## 2024-08-10 MED ORDER — HYDROMORPHONE HCL 1 MG/ML IJ SOLN
0.5000 mg | INTRAMUSCULAR | Status: DC | PRN
  Administered 2024-08-13: 1 mg via INTRAVENOUS
  Filled 2024-08-10: qty 1

## 2024-08-10 MED ORDER — BUDESON-GLYCOPYRROL-FORMOTEROL 160-9-4.8 MCG/ACT IN AERO
2.0000 | INHALATION_SPRAY | Freq: Two times a day (BID) | RESPIRATORY_TRACT | Status: DC
  Administered 2024-08-10 – 2024-08-14 (×5): 2 via RESPIRATORY_TRACT
  Filled 2024-08-10: qty 5.9

## 2024-08-10 NOTE — Progress Notes (Signed)
 Patient report called and provided to receiving RN Joane, Patient's personal belongings placed in personal belongings bag, Cell phone placed in patient's pocket, Hearing aides taken home with daughter Luke and spouse Rock, Daughter and spouse notified that there love one is enroute to [employer] room 136[address]. EMS driver has Museum/gallery exhibitions officer. Patient gone.

## 2024-08-10 NOTE — Discharge Instructions (Signed)
 1)Avoid ibuprofen/Advil/Aleve/Motrin/Goody Powders/Naproxen/BC powders/Meloxicam/Diclofenac /Indomethacin and other Nonsteroidal anti-inflammatory medications as these will make you more likely to bleed and can cause stomach ulcers, can also cause Kidney problems.   2)Transfer to [facility] for ERCP with Dr. Rogelia Copping

## 2024-08-10 NOTE — Progress Notes (Signed)
  Updated patient's daughter Ms. Luke Helling at [phone], questions answered.  Rendall Carwin, MD

## 2024-08-10 NOTE — H&P (Addendum)
 History and Physical    EULA MAZZOLA FMW:[id number] DOB: [birth year] DOA: 08/10/2024  PCP: Cook, Jayce G, DO (Confirm with patient/family/NH records and if not entered, this has to be entered at [facility] point of entry) Patient coming from: Home   I have personally briefly reviewed patient's old medical records in [facility]  Chief Complaint: RUQ pain  HPI: Cameron Smith is a 83 y.o. male with medical history significant of HTN, HLD, hypothyroidism, IIDM, presented with acute RLQ abdominal pain.  Patient reported that she has been having intermittent RUQ abdominal pain since last week, usually after eating dinner, associated with burping and nausea but no vomiting, pain usually subsides by its own without any intervention medication.  Last night however patient started to have 10/10 cramping pain, nonstop and came to ED.  Denied any fever or chills, no chest pain or shortness of breath. ED Course: Borderline tachycardia blood pressure 119/43, lipase was 2800, troponin negative x 2, AST 89 ALT 92 total bilirubin 1.5.  CT abdomen pelvis showed acute uncomplicated pancreatitis and suspected secondary duodenitis, cholelithiasis without inflammatory changes, MRCP positive for gallstone and distal CBD stone choledocholithiasis.  Patient was given IV fluid and pain medication in the ED at [facility] and transferred to [facility] for ERCP.  Review of Systems: As per HPI otherwise 14 point review of systems negative.    Past Medical History:  Diagnosis Date   Acute inflammation of nasal sinus    Chronic   Anxiety    Diverticulitis    Hypercholesteremia    Hypertension    Hypothyroidism    Nasal turbinate hypertrophy    Reactive airways dysfunction syndrome [facility])     Past Surgical History:  Procedure Laterality Date   BACK SURGERY     sciatic   COLONOSCOPY  10/31/2011   Adequate prep, extensive left-sided diverticula, colon otherwise normal.   COLONOSCOPY WITH PROPOFOL  N/A 04/19/2022    Procedure: COLONOSCOPY WITH PROPOFOL ;  Surgeon: Shaaron Lamar HERO, MD;  Location: [facility] ENDO SUITE;  Service: Endoscopy;  Laterality: N/A;  11:15am   NASAL SEPTOPLASTY W/ TURBINOPLASTY Bilateral 08/22/2019   Procedure: Sinus Endoscopy   Nasal Septoplasty with Bilateral TURBINATE REDUCTION, Bilateral Total Ethmoidectomy and Sphenoidotomy;  Surgeon: Carlie Clark, MD;  Location: [facility] OR;  Service: ENT;  Laterality: Bilateral;   NASAL SINUS SURGERY       reports that he quit smoking about 40 years ago. His smoking use included cigarettes. He started smoking about 66 years ago. He has a 52 pack-year smoking history. He has never used smokeless tobacco. He reports that he does not drink alcohol  and does not use drugs.  No Known Allergies  Family History  Problem Relation Age of Onset   Heart disease Father    Diabetes Sister    Colon cancer Neg Hx    Colon polyps Neg Hx      Prior to Admission medications   Medication Sig Start Date End Date Taking? Authorizing Provider  albuterol  (VENTOLIN  HFA) 108 (90 Base) MCG/ACT inhaler Inhale 1-2 puffs into the lungs every 6 (six) hours as needed for wheezing or shortness of breath. 09/19/23   Cook, Jayce G, DO  amLODipine  (NORVASC ) 5 MG tablet TAKE 1 TABLET BY MOUTH DAILY 04/01/24   Cook, Jayce G, DO  aspirin  EC 81 MG tablet Take 1 tablet (81 mg total) by mouth daily with breakfast. 08/10/24   Emokpae, Courage, MD  atorvastatin  (LIPITOR) 80 MG tablet TAKE 1 TABLET BY MOUTH DAILY  12/06/23   Cook, Jayce G, DO  budeson-glycopyrrolate -formoterol  (BREZTRI  AEROSPHERE) 160-9-4.8 MCG/ACT AERO inhaler Inhale 2 puffs into the lungs 2 (two) times daily. 03/10/24   Cook, Jayce G, DO  Coenzyme Q10 200 MG capsule Take 200 mg by mouth every evening.    [provider]  gabapentin  (NEURONTIN ) 100 MG capsule Take 1 capsule (100 mg total) by mouth at bedtime. 05/07/23   Harrison, Stanley E, MD  ipratropium (ATROVENT ) 0.06 % nasal spray Place 2 sprays into both nostrils 4  (four) times daily as needed for rhinitis. 02/02/23   Cook, Jayce G, DO  levothyroxine  (SYNTHROID ) 100 MCG tablet TAKE 1 TABLET BY MOUTH DAILY 04/01/24   Cook, Jayce G, DO  meclizine  (ANTIVERT ) 25 MG tablet Take 1 tablet (25 mg total) by mouth 3 (three) times daily as needed for dizziness. 08/01/24   Mauro Elveria BROCKS, NP  olmesartan  (BENICAR ) 40 MG tablet TAKE 1 TABLET BY MOUTH DAILY 12/24/23   Wert, Michael B, MD  pantoprazole  (PROTONIX ) 40 MG tablet Take 1 tablet (40 mg total) by mouth 2 (two) times daily. Take 30-60 min before first meal of the day. PLEASE SCHEDULE OFFICE VISIT 08/10/24   Pearlean Manus, MD  Polyvinyl Alcohol -Povidone PF (REFRESH) 1.4-0.6 % SOLN Place 1 drop into both eyes daily as needed (dry eye).    [provider]  vitamin E 400 UNIT capsule Take 400 Units by mouth every evening.     [provider]    Physical Exam: Vitals:   08/10/24 1715  Weight: 85.3 kg  Height: 5' 8 (1.727 m)    Constitutional: NAD, calm, comfortable Vitals:   08/10/24 1715  Weight: 85.3 kg  Height: 5' 8 (1.727 m)   Eyes: PERRL, lids and conjunctivae normal ENMT: Mucous membranes are moist. Posterior pharynx clear of any exudate or lesions.Normal dentition.  Neck: normal, supple, no masses, no thyromegaly Respiratory: clear to auscultation bilaterally, no wheezing, no crackles. Normal respiratory effort. No accessory muscle use.  Cardiovascular: Regular rate and rhythm, no murmurs / rubs / gallops. No extremity edema. 2+ pedal pulses. No carotid bruits.  Abdomen: RUQ tenderness, no rebound no guarding, no masses palpated. No hepatosplenomegaly. Bowel sounds positive.  Musculoskeletal: no clubbing / cyanosis. No joint deformity upper and lower extremities. Good ROM, no contractures. Normal muscle tone.  Skin: no rashes, lesions, ulcers. No induration Neurologic: CN 2-12 grossly intact. Sensation intact, DTR normal. Strength 5/5 in all 4.  Psychiatric: Normal judgment and  insight. Alert and oriented x 3. Normal mood.     Labs on Admission: I have personally reviewed following labs and imaging studies  CBC: Recent Labs  Lab 08/10/24 0016  WBC 13.4*  NEUTROABS 12.1*  HGB 13.4  HCT 40.1  MCV 97.8  PLT 253   Basic Metabolic Panel: Recent Labs  Lab 08/10/24 0016 08/10/24 0214  NA 141  --   K 3.5  --   CL 105  --   CO2 25  --   GLUCOSE 136*  --   BUN 26*  --   CREATININE 0.85 0.80  CALCIUM  9.2  --   MG  --  1.9  PHOS  --  2.1*   GFR: Estimated Creatinine Clearance: 75.7 mL/min (by C-G formula based on SCr of 0.8 mg/dL). Liver Function Tests: Recent Labs  Lab 08/10/24 0016  AST 89*  ALT 92*  ALKPHOS 134*  BILITOT 1.5*  PROT 5.9*  ALBUMIN 3.4*   Recent Labs  Lab 08/10/24 0016  LIPASE >  2,800*   No results for input(s): AMMONIA in the last 168 hours. Coagulation Profile: No results for input(s): INR, PROTIME in the last 168 hours. Cardiac Enzymes: No results for input(s): CKTOTAL, CKMB, CKMBINDEX, TROPONINI in the last 168 hours. BNP (last 3 results) No results for input(s): PROBNP in the last 8760 hours. HbA1C: No results for input(s): HGBA1C in the last 72 hours. CBG: No results for input(s): GLUCAP in the last 168 hours. Lipid Profile: No results for input(s): CHOL, HDL, LDLCALC, TRIG, CHOLHDL, LDLDIRECT in the last 72 hours. Thyroid  Function Tests: No results for input(s): TSH, T4TOTAL, FREET4, T3FREE, THYROIDAB in the last 72 hours. Anemia Panel: No results for input(s): VITAMINB12, FOLATE, FERRITIN, TIBC, IRON, RETICCTPCT in the last 72 hours. Urine analysis:    Component Value Date/Time   COLORURINE AMBER (A) 10/09/2019 0735   APPEARANCEUR HAZY (A) 10/09/2019 0735   LABSPEC 1.024 10/09/2019 0735   PHURINE 5.0 10/09/2019 0735   GLUCOSEU NEGATIVE 10/09/2019 0735   HGBUR SMALL (A) 10/09/2019 0735   BILIRUBINUR NEGATIVE 10/09/2019 0735   KETONESUR 5 (A)  10/09/2019 0735   PROTEINUR 100 (A) 10/09/2019 0735   UROBILINOGEN 0.2 11/22/2013 1225   NITRITE NEGATIVE 10/09/2019 0735   LEUKOCYTESUR NEGATIVE 10/09/2019 0735    Radiological Exams on Admission: US  Abdomen Limited Result Date: 08/10/2024 CLINICAL DATA:  6216 Acute pancreatitis 6216 EXAM: ULTRASOUND ABDOMEN LIMITED RIGHT UPPER QUADRANT COMPARISON:  MRI abdomen from earlier the same day. FINDINGS: Gallbladder: Physiologically distended. Redemonstration of a single 1.9 x 2.1 cm sized gallstone. No abnormal wall thickening or pericholecystic free fluid. The technologist noted positive sonographic Murphy's sign. Common bile duct: Diameter: Up to 3.4 mm.  No intrahepatic bile duct dilation. Liver: No focal lesion identified. Within normal limits in parenchymal echogenicity. Portal vein is patent on color Doppler imaging with normal direction of blood flow towards the liver. Other: None. IMPRESSION: *Cholelithiasis without sonographic evidence of acute cholecystitis. However, please note technologist noted positive sonographic Murphy's sign. Correlate clinically. Electronically Signed   By: Ree Molt M.D.   On: 08/10/2024 14:28   MR ABDOMEN MRCP WO CONTRAST Result Date: 08/10/2024 CLINICAL DATA:  Pancreatitis, acute, severe. EXAM: MRI ABDOMEN WITHOUT CONTRAST  (INCLUDING MRCP) TECHNIQUE: Multiplanar multisequence MR imaging of the abdomen was performed. Heavily T2-weighted images of the biliary and pancreatic ducts were obtained, and three-dimensional MRCP images were rendered by post processing. COMPARISON:  Ultrasound abdomen and CT scan abdomen and pelvis from earlier the same day. FINDINGS: Lower chest: Note is made of elevated right hemidiaphragm with associated compressive atelectatic changes in the right lung base. Otherwise, unremarkable MR appearance to the lung bases. No pleural effusion. No pericardial effusion. Normal heart size. Hepatobiliary: The liver is normal in size. Noncirrhotic  configuration. No focal liver lesion. No intra or extrahepatic bile duct dilation. Extrahepatic bile duct measures up to 6 mm, within normal limits for patient's age. However, there are at least 3, 4-5 mm sized calculi in the distal common bile duct. The gallbladder is physiologically distended. There is a single 1.8 x 2.3 cm gallstone. No abnormal wall thickening or pericholecystic fat stranding. Pancreas: There is subtle heterogeneity of the pancreas which exhibit mild surrounding fat stranding, compatible with acute pancreatitis. No peripancreatic collection. No focal pancreatic lesion. Main pancreatic duct is not dilated. Spleen:  Within normal limits in size and appearance. No focal mass. Adrenals/Urinary Tract: Unremarkable adrenal glands. No hydroureteronephrosis. No suspicious renal mass. Stomach/Bowel: Visualized portions within the abdomen are unremarkable. No disproportionate dilation  of bowel loops. Unremarkable appendix. Vascular/Lymphatic: No pathologically enlarged lymph nodes identified. No abdominal aortic aneurysm demonstrated. No ascites. Other:  None. Musculoskeletal: No suspicious bone lesions identified. IMPRESSION: 1. There is subtle heterogeneity of the pancreas which exhibit mild surrounding fat stranding, compatible with acute interstitial pancreatitis. No peripancreatic collection. No focal pancreatic lesion. Main pancreatic duct is not dilated. 2. There is a single 1.8 x 2.3 cm gallstone without imaging signs of acute cholecystitis. 3. There are at least 3, 4-5 mm sized calculi in the distal common bile duct. However, there is no intra or extrahepatic bile duct dilation. 4. Elevated right hemidiaphragm with associated compressive atelectatic changes in the right lung base. Electronically Signed   By: Ree Molt M.D.   On: 08/10/2024 09:25   MR 3D Recon At Scanner Result Date: 08/10/2024 CLINICAL DATA:  Pancreatitis, acute, severe. EXAM: MRI ABDOMEN WITHOUT CONTRAST  (INCLUDING  MRCP) TECHNIQUE: Multiplanar multisequence MR imaging of the abdomen was performed. Heavily T2-weighted images of the biliary and pancreatic ducts were obtained, and three-dimensional MRCP images were rendered by post processing. COMPARISON:  Ultrasound abdomen and CT scan abdomen and pelvis from earlier the same day. FINDINGS: Lower chest: Note is made of elevated right hemidiaphragm with associated compressive atelectatic changes in the right lung base. Otherwise, unremarkable MR appearance to the lung bases. No pleural effusion. No pericardial effusion. Normal heart size. Hepatobiliary: The liver is normal in size. Noncirrhotic configuration. No focal liver lesion. No intra or extrahepatic bile duct dilation. Extrahepatic bile duct measures up to 6 mm, within normal limits for patient's age. However, there are at least 3, 4-5 mm sized calculi in the distal common bile duct. The gallbladder is physiologically distended. There is a single 1.8 x 2.3 cm gallstone. No abnormal wall thickening or pericholecystic fat stranding. Pancreas: There is subtle heterogeneity of the pancreas which exhibit mild surrounding fat stranding, compatible with acute pancreatitis. No peripancreatic collection. No focal pancreatic lesion. Main pancreatic duct is not dilated. Spleen:  Within normal limits in size and appearance. No focal mass. Adrenals/Urinary Tract: Unremarkable adrenal glands. No hydroureteronephrosis. No suspicious renal mass. Stomach/Bowel: Visualized portions within the abdomen are unremarkable. No disproportionate dilation of bowel loops. Unremarkable appendix. Vascular/Lymphatic: No pathologically enlarged lymph nodes identified. No abdominal aortic aneurysm demonstrated. No ascites. Other:  None. Musculoskeletal: No suspicious bone lesions identified. IMPRESSION: 1. There is subtle heterogeneity of the pancreas which exhibit mild surrounding fat stranding, compatible with acute interstitial pancreatitis. No  peripancreatic collection. No focal pancreatic lesion. Main pancreatic duct is not dilated. 2. There is a single 1.8 x 2.3 cm gallstone without imaging signs of acute cholecystitis. 3. There are at least 3, 4-5 mm sized calculi in the distal common bile duct. However, there is no intra or extrahepatic bile duct dilation. 4. Elevated right hemidiaphragm with associated compressive atelectatic changes in the right lung base. Electronically Signed   By: Ree Molt M.D.   On: 08/10/2024 09:25   CT ABDOMEN PELVIS W CONTRAST Result Date: 08/10/2024 EXAM: CT ABDOMEN AND PELVIS WITH CONTRAST 08/10/2024 03:33:50 AM TECHNIQUE: CT of the abdomen and pelvis was performed with the administration of intravenous contrast. Multiplanar reformatted images are provided for review. Automated exposure control, iterative reconstruction, and/or weight-based adjustment of the mA/kV was utilized to reduce the radiation dose to as low as reasonably achievable. COMPARISON: 10/09/2019 CLINICAL HISTORY: Pancreatitis suspected. Pt c/o upper abd pain/ substernal chest pain that started about 7pm. Pt states the pain has been constant and hasn't  changed since it started. Denies cardiac hx. FINDINGS: LOWER CHEST: Eventration of the right hemidiaphragm with associated right basilar atelectasis. LIVER: Unremarkable. GALLBLADDER AND BILE DUCTS: Layering 2.0 cm gallstone (image 30), without associated inflammatory changes. SPLEEN: Unremarkable. PANCREAS: Pancreatic inflammatory changes most prominent along the pancreatic tail (image 34), reflecting acute pancreatitis. No walled off fluid collection/pseudocyst. No pancreatic necrosis. ADRENAL GLANDS: Unremarkable. KIDNEYS, URETERS AND BLADDER: Kidneys are within normal limits. No renal, ureteral, or bladder calculi. No hydronephrosis. Bladder is within normal limits. GI AND BOWEL: Secondary inflammatory changes involving the second/third portion of the duodenum (image 50), suggesting superimposed  duodenitis. Normal appendix (image 58). Left colonic diverticulosis, without evidence of diverticulitis. PERITONEUM AND RETROPERITONEUM: No ascites or free air. VASCULATURE: Thoracic aortic atherosclerosis. Atherosclerotic calcifications of the abdominal aorta and branch vessels. Mild coronary artery insufficiency of the LAD and right coronary artery. LYMPH NODES: No lymphadenopathy. REPRODUCTIVE ORGANS: Prostate is notable for dystrophic calcifications. BONES AND SOFT TISSUES: Mild degenerative changes of the lumbar spine. IMPRESSION: 1. Acute uncomplicated pancreatitis. 2. Suspected secondary duodenitis. 3. Cholelithiasis, without associated inflammatory changes. Electronically signed by: Pinkie Pebbles MD 08/10/2024 03:40 AM EDT RP Workstation: [id number]   DG Chest Portable 1 View Result Date: 08/10/2024 EXAM: 1 VIEW XRAY OF THE CHEST 08/10/2024 12:27:39 AM COMPARISON: 06/20/2019 CLINICAL HISTORY: Chest pain. Pt c/o upper abd pain/ substernal chest pain that started about 7pm. Pt states the pain has been constant and hasn't changed since it started. Denies cardiac hx. FINDINGS: LUNGS AND PLEURA: Mild left basilar opacity, likely atelectasis. No pulmonary edema. No pleural effusion. No pneumothorax. HEART AND MEDIASTINUM: No acute abnormality of the cardiac and mediastinal silhouettes. BONES AND SOFT TISSUES: Stable mild eventration of the right hemidiaphragm. No acute osseous abnormality. IMPRESSION: 1. Mild left basilar opacity, likely atelectasis. Electronically signed by: Pinkie Pebbles MD 08/10/2024 12:30 AM EDT RP Workstation: [id number]    EKG: Independently reviewed.  Sinus rhythm, no acute ST changes.  Assessment/Plan Principal Problem:   Pancreatitis due to biliary obstruction Active Problems:   Acute gallstone pancreatitis  (please populate well all problems here in Problem List. (For example, if patient is on BP meds at home and you resume or decide to hold them, it is a problem that  needs to be her. Same for CAD, COPD, HLD and so on)  Acute gallstone pancreatitis, BICAP=2 point, low mortality - Pain control - IV fluid, n.p.o. -Given the risk of ascending cholangitis, we will continue prophylactic antibiotics Unasyn.  Patient received Zosyn at [facility] ED - GI at [facility] consulted for ERCP tomorrow -Message general surgeon for inpatient versus outpatient cholecystectomy  Acute transaminitis Acute bilirubinemia -Secondary to CBD stone, management as above  HTN - BP borderline low, hold off home p.o. BP meds, start as needed hydralazine   HLD - Hold off statin while LFTs abnormal  Hypothyroidism - Continue Synthroid   GERD - Continue PPI  Total time spent on patient care 55 minutes. DVT prophylaxis: SCD Code Status: Full code Family Communication: ED physician discussed with patient's primary Disposition Plan: Patient sick with gallstone pancreatitis requiring multidiscipline management including GI and general surgery, expect more than 2 midnight hospital stay Consults called: GI, also messaged general surgeon at [facility] Admission status: Telemetry admission   Cort ONEIDA Mana MD [facility] Pager [phone]  08/10/2024, 5:55 PM

## 2024-08-10 NOTE — H&P (Signed)
 History and Physical    Patient: Cameron Smith FMW:[id number] DOB: 1941-03-15 DOA: 08/09/2024 DOS: the patient was seen and examined on 08/10/2024 PCP: Cook, Jayce G, DO  Patient coming from: Home  Chief Complaint:  Chief Complaint  Patient presents with   Chest Pain   HPI: Cameron Smith is a 83 y.o. male with medical history significant of hypertension, hyperlipidemia, type 2 diabetes mellitus, hypothyroidism who presents to the emergency department due to upper abdominal pain which started around 7 PM yesterday.  It started shortly after eating some jelly toast.  Pain was sharp, constant and nonradiating, it was rated as 10/10 on pain scale.  He endorsed nausea, but denies vomiting.  Patient denies chest pain, shortness of breath, fever, chills.  ED Course:  In the emergency department, BP was 119/43, other vital signs were within normal range.  Workup in the ED showed normal CBC except for WBC of 13.4.  BMP was normal except for blood glucose of 136, BUN 26, albumin 3.4, AST 89, ALT 92, ALP 134, total bilirubin 1.5.  Lipase > 2,800, troponin x 2 was normal. CT abdomen and pelvis with contrast showed acute uncomplicated pancreatitis.  Suspected secondary duodenitis.  Cholelithiasis, without associated inflammatory changes. Chest x-ray showed mild left basilar opacity likely atelectasis He was treated with IV morphine , Zofran  was given. Gastroenterologist at [facility] (Dr. Stacia) was consulted and recommended MRCP to ensure if patient has choledocholithiasis so as to decide if patient will require ERCP or not.  Review of Systems: Review of systems as noted in the HPI. All other systems reviewed and are negative.   Past Medical History:  Diagnosis Date   Acute inflammation of nasal sinus    Chronic   Anxiety    Diverticulitis    Hypercholesteremia    Hypertension    Hypothyroidism    Nasal turbinate hypertrophy    Reactive airways dysfunction syndrome [facility])    Past Surgical  History:  Procedure Laterality Date   BACK SURGERY     sciatic   COLONOSCOPY  10/31/2011   Adequate prep, extensive left-sided diverticula, colon otherwise normal.   COLONOSCOPY WITH PROPOFOL  N/A 04/19/2022   Procedure: COLONOSCOPY WITH PROPOFOL ;  Surgeon: Shaaron Lamar HERO, MD;  [facility];  Service: Endoscopy;  Laterality: N/A;  11:15am   NASAL SEPTOPLASTY W/ TURBINOPLASTY Bilateral 08/22/2019   Procedure: Sinus Endoscopy   Nasal Septoplasty with Bilateral TURBINATE REDUCTION, Bilateral Total Ethmoidectomy and Sphenoidotomy;  Surgeon: Carlie Clark, MD;  Location: [facility] OR;  Service: ENT;  Laterality: Bilateral;   NASAL SINUS SURGERY      Social History:  reports that he quit smoking about 40 years ago. His smoking use included cigarettes. He started smoking about 66 years ago. He has a 52 pack-year smoking history. He has never used smokeless tobacco. He reports that he does not drink alcohol  and does not use drugs.   No Known Allergies  Family History  Problem Relation Age of Onset   Heart disease Father    Diabetes Sister    Colon cancer Neg Hx    Colon polyps Neg Hx      Prior to Admission medications   Medication Sig Start Date End Date Taking? Authorizing Provider  albuterol  (VENTOLIN  HFA) 108 (90 Base) MCG/ACT inhaler Inhale 1-2 puffs into the lungs every 6 (six) hours as needed for wheezing or shortness of breath. 09/19/23   Cook, Jayce G, DO  amLODipine  (NORVASC ) 5 MG tablet TAKE 1 TABLET BY MOUTH DAILY  04/01/24   Cook, Jayce G, DO  aspirin  EC 81 MG tablet Take 81 mg by mouth every evening.    [provider]  atorvastatin  (LIPITOR) 80 MG tablet TAKE 1 TABLET BY MOUTH DAILY 12/06/23   Cook, Jayce G, DO  budeson-glycopyrrolate -formoterol  (BREZTRI  AEROSPHERE) 160-9-4.8 MCG/ACT AERO inhaler Inhale 2 puffs into the lungs 2 (two) times daily. 03/10/24   Cook, Jayce G, DO  Coenzyme Q10 200 MG capsule Take 200 mg by mouth every evening.    [provider]   famotidine  (PEPCID ) 20 MG tablet One after supper 04/23/23   Darlean Ozell NOVAK, MD  gabapentin  (NEURONTIN ) 100 MG capsule Take 1 capsule (100 mg total) by mouth at bedtime. 05/07/23   Harrison, Stanley E, MD  ipratropium (ATROVENT ) 0.06 % nasal spray Place 2 sprays into both nostrils 4 (four) times daily as needed for rhinitis. 02/02/23   Cook, Jayce G, DO  levothyroxine  (SYNTHROID ) 100 MCG tablet TAKE 1 TABLET BY MOUTH DAILY 04/01/24   Cook, Jayce G, DO  meclizine  (ANTIVERT ) 25 MG tablet Take 1 tablet (25 mg total) by mouth 3 (three) times daily as needed for dizziness. 08/01/24   Mauro Elveria BROCKS, NP  olmesartan  (BENICAR ) 40 MG tablet TAKE 1 TABLET BY MOUTH DAILY 12/24/23   Wert, Michael B, MD  pantoprazole  (PROTONIX ) 40 MG tablet Take 1 tablet (40 mg total) by mouth daily. Take 30-60 min before first meal of the day. PLEASE SCHEDULE OFFICE VISIT 09/24/23   Darlean Ozell NOVAK, MD  Polyvinyl Alcohol -Povidone PF (REFRESH) 1.4-0.6 % SOLN Place 1 drop into both eyes daily as needed (dry eye).    [provider]  tadalafil  (CIALIS ) 10 MG tablet Take 1 tablet (10 mg total) by mouth daily as needed for erectile dysfunction. 03/21/24   Cook, Jayce G, DO  vitamin E 400 UNIT capsule Take 400 Units by mouth every evening.     [provider]    Physical Exam: BP (!) 107/54 (BP Location: Left Arm)   Pulse 79   Temp 98.8 F (37.1 C) (Oral)   Resp 18   Ht 5' 8 (1.727 m)   Wt 85.3 kg   SpO2 96%   BMI 28.59 kg/m   General: 83 y.o. year-old male well developed well nourished in no acute distress.  Alert and oriented x3. HEENT: NCAT, EOMI Neck: Supple, trachea medial Cardiovascular: Regular rate and rhythm with no rubs or gallops.  No thyromegaly or JVD noted.  No lower extremity edema. 2/4 pulses in all 4 extremities. Respiratory: Clear to auscultation with no wheezes or rales. Good inspiratory effort. Abdomen: Soft, tender to palpation of the epigastrium without guarding.  Nondistended with  normal bowel sounds x4 quadrants. Muskuloskeletal: No cyanosis, clubbing or edema noted bilaterally Neuro: CN II-XII intact, strength 5/5 x 4, sensation, reflexes intact Skin: No ulcerative lesions noted or rashes Psychiatry: Judgement and insight appear normal. Mood is appropriate for condition and setting          Labs on Admission:  Basic Metabolic Panel: Recent Labs  Lab 08/10/24 0016 08/10/24 0214  NA 141  --   K 3.5  --   CL 105  --   CO2 25  --   GLUCOSE 136*  --   BUN 26*  --   CREATININE 0.85 0.80  CALCIUM  9.2  --   MG  --  1.9  PHOS  --  2.1*   Liver Function Tests: Recent Labs  Lab 08/10/24 0016  AST 89*  ALT 92*  ALKPHOS 134*  BILITOT 1.5*  PROT 5.9*  ALBUMIN 3.4*   Recent Labs  Lab 08/10/24 0016  LIPASE >2,800*   No results for input(s): AMMONIA in the last 168 hours. CBC: Recent Labs  Lab 08/10/24 0016  WBC 13.4*  NEUTROABS 12.1*  HGB 13.4  HCT 40.1  MCV 97.8  PLT 253   Cardiac Enzymes: No results for input(s): CKTOTAL, CKMB, CKMBINDEX, TROPONINI in the last 168 hours.  BNP (last 3 results) No results for input(s): BNP in the last 8760 hours.  ProBNP (last 3 results) No results for input(s): PROBNP in the last 8760 hours.  CBG: No results for input(s): GLUCAP in the last 168 hours.  Radiological Exams on Admission: CT ABDOMEN PELVIS W CONTRAST Result Date: 08/10/2024 EXAM: CT ABDOMEN AND PELVIS WITH CONTRAST 08/10/2024 03:33:50 AM TECHNIQUE: CT of the abdomen and pelvis was performed with the administration of intravenous contrast. Multiplanar reformatted images are provided for review. Automated exposure control, iterative reconstruction, and/or weight-based adjustment of the mA/kV was utilized to reduce the radiation dose to as low as reasonably achievable. COMPARISON: 10/09/2019 CLINICAL HISTORY: Pancreatitis suspected. Pt c/o upper abd pain/ substernal chest pain that started about 7pm. Pt states the pain has been  constant and hasn't changed since it started. Denies cardiac hx. FINDINGS: LOWER CHEST: Eventration of the right hemidiaphragm with associated right basilar atelectasis. LIVER: Unremarkable. GALLBLADDER AND BILE DUCTS: Layering 2.0 cm gallstone (image 30), without associated inflammatory changes. SPLEEN: Unremarkable. PANCREAS: Pancreatic inflammatory changes most prominent along the pancreatic tail (image 34), reflecting acute pancreatitis. No walled off fluid collection/pseudocyst. No pancreatic necrosis. ADRENAL GLANDS: Unremarkable. KIDNEYS, URETERS AND BLADDER: Kidneys are within normal limits. No renal, ureteral, or bladder calculi. No hydronephrosis. Bladder is within normal limits. GI AND BOWEL: Secondary inflammatory changes involving the second/third portion of the duodenum (image 50), suggesting superimposed duodenitis. Normal appendix (image 58). Left colonic diverticulosis, without evidence of diverticulitis. PERITONEUM AND RETROPERITONEUM: No ascites or free air. VASCULATURE: Thoracic aortic atherosclerosis. Atherosclerotic calcifications of the abdominal aorta and branch vessels. Mild coronary artery insufficiency of the LAD and right coronary artery. LYMPH NODES: No lymphadenopathy. REPRODUCTIVE ORGANS: Prostate is notable for dystrophic calcifications. BONES AND SOFT TISSUES: Mild degenerative changes of the lumbar spine. IMPRESSION: 1. Acute uncomplicated pancreatitis. 2. Suspected secondary duodenitis. 3. Cholelithiasis, without associated inflammatory changes. Electronically signed by: Pinkie Pebbles MD 08/10/2024 03:40 AM EDT RP Workstation: [id number]   DG Chest Portable 1 View Result Date: 08/10/2024 EXAM: 1 VIEW XRAY OF THE CHEST 08/10/2024 12:27:39 AM COMPARISON: 06/20/2019 CLINICAL HISTORY: Chest pain. Pt c/o upper abd pain/ substernal chest pain that started about 7pm. Pt states the pain has been constant and hasn't changed since it started. Denies cardiac hx. FINDINGS: LUNGS AND  PLEURA: Mild left basilar opacity, likely atelectasis. No pulmonary edema. No pleural effusion. No pneumothorax. HEART AND MEDIASTINUM: No acute abnormality of the cardiac and mediastinal silhouettes. BONES AND SOFT TISSUES: Stable mild eventration of the right hemidiaphragm. No acute osseous abnormality. IMPRESSION: 1. Mild left basilar opacity, likely atelectasis. Electronically signed by: Pinkie Pebbles MD 08/10/2024 12:30 AM EDT RP Workstation: [id number]    EKG: I independently viewed the EKG done and my findings are as followed: Normal sinus rhythm at a rate of 65 bpm with incomplete RBBB and LAFB  Assessment/Plan Present on Admission:  Acute pancreatitis  Essential hypertension  Mixed hyperlipidemia  Acquired hypothyroidism  Principal Problem:   Acute pancreatitis Active Problems:   Essential hypertension   Mixed  hyperlipidemia   Acquired hypothyroidism   Transaminasemia   Hypoalbuminemia due to protein-calorie malnutrition (HCC)   GERD (gastroesophageal reflux disease)   Reactive airways dysfunction syndrome (HCC)  Acute pancreatitis Lipase >2,800 CT abdomen and pelvis with contrast showed acute uncomplicated pancreatitis.  Continue IV Zofran  p.r.n Continue IV morphine  p.r.n for pain Continue Protonix  Continue IV LR at 100ml/Hr Continue full liquid diet with plan to advance diet as tolerated RUQ U/S in the morning to investigate biliary etiology (gallstone and bile duct dilatation) MRCP will be done in the morning Consider gastroenterology consult for ERCP if MRCP is positive for choledocholithiasis  Transaminasemia possibly secondary to above AST 89, ALT 92, ALP 134 Continue to monitor liver enzymes  Hypoalbuminemia possibly due to mild protein calorie malnutrition Albumin 3.4, continue protein supplement  Essential hypertension BP meds will be held at this time due to soft BP  Mixed hyperlipidemia Statin will be held at this time due to  transaminasemia  Acquired hypothyroidism Continue Synthroid   GERD Continue Protonix   Reactive airway dysfunction syndrome Continue home meds  DVT prophylaxis: Lovenox   Code Status: Full code  Family Communication: Wife at bedside (all questions answered to satisfaction)  Consults: None  Severity of Illness: The appropriate patient status for this patient is INPATIENT. Inpatient status is judged to be reasonable and necessary in order to provide the required intensity of service to ensure the patient's safety. The patient's presenting symptoms, physical exam findings, and initial radiographic and laboratory data in the context of their chronic comorbidities is felt to place them at high risk for further clinical deterioration. Furthermore, it is not anticipated that the patient will be medically stable for discharge from the hospital within 2 midnights of admission.   * I certify that at the point of admission it is my clinical judgment that the patient will require inpatient hospital care spanning beyond 2 midnights from the point of admission due to high intensity of service, high risk for further deterioration and high frequency of surveillance required.*  Author: Shaasia Odle, DO 08/10/2024 7:10 AM  For on call review [email or web address].

## 2024-08-10 NOTE — Consult Note (Signed)
 Toribio Fortune, M.D. Gastroenterology & Hepatology                                           Patient Name: Cameron Smith Account #: @FLAACCTNO @   MRN: [id number] Admission Date: 08/09/2024 Date of Evaluation:  08/10/2024 Time of Evaluation: 11:29 AM   Referring Physician: PCP  Chief Complaint: Gallstone pancreatitis  HPI:  This is a 83 y.o. male with history of hyperlipidemia, hypertension, hypothyroidism, anxiety, who came to the hospital after presenting acute onset of epigastric pain.  Gastroenterology was consulted for gallstone pancreatitis.  Patient reports that yesterday night (7 PM) he presented acute onset of upper abdominal pain in the epigastric area that did not radiate anywhere else.  The pain was severe and sharp, 10 out of 10 in severity.  He denies having any nausea, vomiting, fever, chills, hematochezia, melena, hematemesis, abdominal distention, diarrhea, jaundice, pruritus.  Never had similar symptoms in the past.  Patient does not take any NSAIDs or anticoagulants.  In the ED, he was HD stable and afebrile. Labs were remarkable for markedly elevated lipase of more than 2900, CMP with AST 89, ALT 92, total bilirubin 1.5, alkaline phosphatase 134, normal renal panel and electrolytes.  CBC with WBC 13.4, rest of cell lines within normal limits.  CT of the abdomen and pelvis with IV contrast was performed which showed presence of uncomplicated pancreatitis and duodenitis due to pancreatitis.  There was presence of cholelithiasis without inflammatory changes.  Subsequent MRCP was performed today in the morning which showed presence of changes consistent with acute interstitial pancreatitis without complication.  There was presence of a single 1.8 x 2.3 cm gallstone in the gallbladder without cholecystitis.  There was also presence of at least three 4-5 millimeter calculi in the distal CBD without ductal dilation.   Past Medical History: SEE CHRONIC ISSSUES: Past Medical  History:  Diagnosis Date   Acute inflammation of nasal sinus    Chronic   Anxiety    Diverticulitis    Hypercholesteremia    Hypertension    Hypothyroidism    Nasal turbinate hypertrophy    Reactive airways dysfunction syndrome [facility])    Past Surgical History:  Past Surgical History:  Procedure Laterality Date   BACK SURGERY     sciatic   COLONOSCOPY  10/31/2011   Adequate prep, extensive left-sided diverticula, colon otherwise normal.   COLONOSCOPY WITH PROPOFOL  N/A 04/19/2022   Procedure: COLONOSCOPY WITH PROPOFOL ;  Surgeon: Shaaron Lamar HERO, MD;  [facility];  Service: Endoscopy;  Laterality: N/A;  11:15am   NASAL SEPTOPLASTY W/ TURBINOPLASTY Bilateral 08/22/2019   Procedure: Sinus Endoscopy   Nasal Septoplasty with Bilateral TURBINATE REDUCTION, Bilateral Total Ethmoidectomy and Sphenoidotomy;  Surgeon: Carlie Clark, MD;  Location: [facility] OR;  Service: ENT;  Laterality: Bilateral;   NASAL SINUS SURGERY     Family History:  Family History  Problem Relation Age of Onset   Heart disease Father    Diabetes Sister    Colon cancer Neg Hx    Colon polyps Neg Hx    Social History:  Social History   Tobacco Use   Smoking status: Former    Current packs/day: 0.00    Average packs/day: 2.0 packs/day for 26.0 years (52.0 ttl pk-yrs)    Types: Cigarettes    Start date: 12/28/1957    Quit date: 11/29/1983  Years since quitting: 40.7   Smokeless tobacco: Never  Vaping Use   Vaping status: Never Used  Substance Use Topics   Alcohol  use: No   Drug use: No    Home Medications:  Prior to Admission medications   Medication Sig Start Date End Date Taking? Authorizing Provider  albuterol  (VENTOLIN  HFA) 108 (90 Base) MCG/ACT inhaler Inhale 1-2 puffs into the lungs every 6 (six) hours as needed for wheezing or shortness of breath. 09/19/23   Cook, Jayce G, DO  amLODipine  (NORVASC ) 5 MG tablet TAKE 1 TABLET BY MOUTH DAILY 04/01/24   Cook, Jayce G, DO  aspirin  EC 81 MG tablet  Take 81 mg by mouth every evening.    [provider]  atorvastatin  (LIPITOR) 80 MG tablet TAKE 1 TABLET BY MOUTH DAILY 12/06/23   Cook, Jayce G, DO  budeson-glycopyrrolate -formoterol  (BREZTRI  AEROSPHERE) 160-9-4.8 MCG/ACT AERO inhaler Inhale 2 puffs into the lungs 2 (two) times daily. 03/10/24   Cook, Jayce G, DO  Coenzyme Q10 200 MG capsule Take 200 mg by mouth every evening.    [provider]  famotidine  (PEPCID ) 20 MG tablet One after supper 04/23/23   Darlean Ozell NOVAK, MD  gabapentin  (NEURONTIN ) 100 MG capsule Take 1 capsule (100 mg total) by mouth at bedtime. 05/07/23   Harrison, Stanley E, MD  ipratropium (ATROVENT ) 0.06 % nasal spray Place 2 sprays into both nostrils 4 (four) times daily as needed for rhinitis. 02/02/23   Cook, Jayce G, DO  levothyroxine  (SYNTHROID ) 100 MCG tablet TAKE 1 TABLET BY MOUTH DAILY 04/01/24   Cook, Jayce G, DO  meclizine  (ANTIVERT ) 25 MG tablet Take 1 tablet (25 mg total) by mouth 3 (three) times daily as needed for dizziness. 08/01/24   Mauro Elveria BROCKS, NP  olmesartan  (BENICAR ) 40 MG tablet TAKE 1 TABLET BY MOUTH DAILY 12/24/23   Wert, Michael B, MD  pantoprazole  (PROTONIX ) 40 MG tablet Take 1 tablet (40 mg total) by mouth daily. Take 30-60 min before first meal of the day. PLEASE SCHEDULE OFFICE VISIT 09/24/23   Darlean Ozell NOVAK, MD  Polyvinyl Alcohol -Povidone PF (REFRESH) 1.4-0.6 % SOLN Place 1 drop into both eyes daily as needed (dry eye).    [provider]  tadalafil  (CIALIS ) 10 MG tablet Take 1 tablet (10 mg total) by mouth daily as needed for erectile dysfunction. 03/21/24   Cook, Jayce G, DO  vitamin E 400 UNIT capsule Take 400 Units by mouth every evening.     [provider]    Inpatient Medications:  Current Facility-Administered Medications:    budesonide -glycopyrrolate -formoterol  (BREZTRI ) 160-9-4.8 MCG/ACT inhaler 2 puff, 2 puff, Inhalation, BID, Adefeso, Oladapo, DO, 2 puff at 08/10/24 1027   feeding supplement (GLUCERNA  SHAKE) (GLUCERNA SHAKE) liquid 237 mL, 237 mL, Oral, TID BM, Adefeso, Oladapo, DO, 237 mL at 08/10/24 1059   lactated ringers  infusion, , Intravenous, Continuous, Adefeso, Oladapo, DO, Last Rate: 100 mL/hr at 08/10/24 0645, New Bag at 08/10/24 0645   levothyroxine  (SYNTHROID ) tablet 100 mcg, 100 mcg, Oral, Daily, Adefeso, Oladapo, DO, 100 mcg at 08/10/24 1055   morphine  (PF) 2 MG/ML injection 2 mg, 2 mg, Intravenous, Q3H PRN, Adefeso, Oladapo, DO   ondansetron  (ZOFRAN ) tablet 4 mg, 4 mg, Oral, Q6H PRN **OR** ondansetron  (ZOFRAN ) injection 4 mg, 4 mg, Intravenous, Q6H PRN, Adefeso, Oladapo, DO   pantoprazole  (PROTONIX ) injection 40 mg, 40 mg, Intravenous, Q12H, Emokpae, Courage, MD Allergies: Patient has no known allergies.  Complete Review of Systems: GENERAL: negative for malaise,  night sweats HEENT: No changes in hearing or vision, no nose bleeds or other nasal problems. NECK: Negative for lumps, goiter, pain and significant neck swelling RESPIRATORY: Negative for cough, wheezing CARDIOVASCULAR: Negative for chest pain, leg swelling, palpitations, orthopnea GI: SEE HPI MUSCULOSKELETAL: Negative for joint pain or swelling, back pain, and muscle pain. SKIN: Negative for lesions, rash PSYCH: Negative for sleep disturbance, mood disorder and recent psychosocial stressors. HEMATOLOGY Negative for prolonged bleeding, bruising easily, and swollen nodes. ENDOCRINE: Negative for cold or heat intolerance, polyuria, polydipsia and goiter. NEURO: negative for tremor, gait imbalance, syncope and seizures. The remainder of the review of systems is noncontributory.  Physical Exam: BP (!) 107/54 (BP Location: Left Arm)   Pulse 79   Temp 98.8 F (37.1 C) (Oral)   Resp 18   Ht 5' 8 (1.727 m)   Wt 85.3 kg   SpO2 92% Comment: at rest  BMI 28.59 kg/m  GENERAL: The patient is AO x3, in no acute distress. HEENT: Head is normocephalic and atraumatic. EOMI are intact. Mouth is well hydrated and without  lesions. NECK: Supple. No masses LUNGS: Clear to auscultation. No presence of rhonchi/wheezing/rales. Adequate chest expansion HEART: RRR, normal s1 and s2. ABDOMEN: Mildly tender in the epigastric area,, no guarding, no peritoneal signs, and nondistended. BS +. No masses. EXTREMITIES: Without any cyanosis, clubbing, rash, lesions or edema. NEUROLOGIC: AOx3, no focal motor deficit. SKIN: no jaundice, no rashes  Laboratory Data CBC:     Component Value Date/Time   WBC 13.4 (H) 08/10/2024 0016   RBC 4.10 (L) 08/10/2024 0016   HGB 13.4 08/10/2024 0016   HGB 15.2 09/13/2023 0824   HCT 40.1 08/10/2024 0016   HCT 46.0 09/13/2023 0824   PLT 253 08/10/2024 0016   PLT 223 09/13/2023 0824   MCV 97.8 08/10/2024 0016   MCV 97 09/13/2023 0824   MCH 32.7 08/10/2024 0016   MCHC 33.4 08/10/2024 0016   RDW 12.6 08/10/2024 0016   RDW 12.6 09/13/2023 0824   LYMPHSABS 0.8 08/10/2024 0016   LYMPHSABS 1.5 09/13/2023 0824   MONOABS 0.4 08/10/2024 0016   EOSABS 0.1 08/10/2024 0016   EOSABS 0.5 (H) 09/13/2023 0824   BASOSABS 0.0 08/10/2024 0016   BASOSABS 0.1 09/13/2023 0824   COAG:  Lab Results  Component Value Date   INR 0.92 12/20/2009    BMP:     Latest Ref Rng & Units 08/10/2024    2:14 AM 08/10/2024   12:16 AM 03/17/2024    8:06 AM  BMP  Glucose 70 - 99 mg/dL  863  93   BUN 8 - 23 mg/dL  26  17   Creatinine 9.38 - 1.24 mg/dL 9.19  9.14  9.02   BUN/Creat Ratio 10 - 24   18   Sodium 135 - 145 mmol/L  141  140   Potassium 3.5 - 5.1 mmol/L  3.5  4.4   Chloride 98 - 111 mmol/L  105  104   CO2 22 - 32 mmol/L  25  26   Calcium  8.9 - 10.3 mg/dL  9.2  9.5     HEPATIC:     Latest Ref Rng & Units 08/10/2024   12:16 AM 03/17/2024    8:06 AM 09/13/2023    8:24 AM  Hepatic Function  Total Protein 6.5 - 8.1 g/dL 5.9  6.2  6.1   Albumin 3.5 - 5.0 g/dL 3.4  4.4  4.1   AST 15 - 41 U/L 89  17  18   ALT 0 - 44 U/L 92  21  26   Alk Phosphatase 38 - 126 U/L 134  75  79   Total Bilirubin 0.0 -  1.2 mg/dL 1.5  0.5  0.7     CARDIAC: No results found for: CKTOTAL, CKMB, CKMBINDEX, TROPONINI   Imaging: I personally reviewed and interpreted the available imaging.  Assessment & Plan: Cameron Smith is a 83 y.o. male with history of hyperlipidemia, hypertension, hypothyroidism, anxiety, who came to the hospital after presenting acute onset of epigastric pain.  Gastroenterology was consulted for gallstone pancreatitis.  Patient presented with acute onset of symptoms and imaging findings consistent with acute pancreatitis.  Etiology of this was confirmed to be secondary to gallstone pancreatitis given MRI findings.  Due to this, the case was discussed with Dr. Jinny to proceed with nonemergent ERCP as the patient is not presenting cholangitis at the moment.  Patient is agreeable to be transferred to proceed with this tomorrow.  For now, we will focus on pain control and maintaining adequate hydration with lactated ringer .  -Transferred to [facility] for ERCP - Clear liquid for now, will need to keep n.p.o. after midnight - c/w lactated ringer  @ 100 cc/h - Pain control per primary team - Trend LFTs daily  Toribio Fortune, MD Gastroenterology and Hepatology [facility] Gastroenterology

## 2024-08-10 NOTE — Discharge Summary (Addendum)
 Cameron Smith, is a 83 y.o. male  DOB 1941/10/31  MRN [id number].  Admission date:  08/09/2024  Admitting Physician  Posey Maier, DO  Discharge Date:  08/10/2024   Primary MD  Cook, Jayce G, DO  Recommendations for primary care physician for things to follow:  1)Avoid ibuprofen/Advil/Aleve/Motrin/Goody Powders/Naproxen/BC powders/Meloxicam/Diclofenac /Indomethacin and other Nonsteroidal anti-inflammatory medications as these will make you more likely to bleed and can cause stomach ulcers, can also cause Kidney problems.   2)Transfer to [facility] for ERCP with Dr. Rogelia Copping   Admission Diagnosis  Acute pancreatitis [K85.90] Gallstone pancreatitis [K85.10]  Discharge Diagnosis  Acute pancreatitis [K85.90] Gallstone pancreatitis [K85.10]    Principal Problem:   Acute pancreatitis Active Problems:   Essential hypertension   Mixed hyperlipidemia   Acquired hypothyroidism   Transaminasemia   Hypoalbuminemia due to protein-calorie malnutrition (HCC)   GERD (gastroesophageal reflux disease)   Reactive airways dysfunction syndrome (HCC)      Past Medical History:  Diagnosis Date   Acute inflammation of nasal sinus    Chronic   Anxiety    Diverticulitis    Hypercholesteremia    Hypertension    Hypothyroidism    Nasal turbinate hypertrophy    Reactive airways dysfunction syndrome [facility])     Past Surgical History:  Procedure Laterality Date   BACK SURGERY     sciatic   COLONOSCOPY  10/31/2011   Adequate prep, extensive left-sided diverticula, colon otherwise normal.   COLONOSCOPY WITH PROPOFOL  N/A 04/19/2022   Procedure: COLONOSCOPY WITH PROPOFOL ;  Surgeon: Shaaron Lamar HERO, MD;  Location: [facility];  Service: Endoscopy;  Laterality: N/A;  11:15am   NASAL SEPTOPLASTY W/ TURBINOPLASTY Bilateral 08/22/2019   Procedure: Sinus Endoscopy   Nasal Septoplasty with Bilateral TURBINATE REDUCTION,  Bilateral Total Ethmoidectomy and Sphenoidotomy;  Surgeon: Carlie Clark, MD;  Location: [facility] OR;  Service: ENT;  Laterality: Bilateral;   NASAL SINUS SURGERY       HPI  from the history and physical done on the day of admission:  HPI: Cameron Smith is a 83 y.o. male with medical history significant of hypertension, hyperlipidemia, type 2 diabetes mellitus, hypothyroidism who presents to the emergency department due to upper abdominal pain which started around 7 PM yesterday.  It started shortly after eating some jelly toast.  Pain was sharp, constant and nonradiating, it was rated as 10/10 on pain scale.  He endorsed nausea, but denies vomiting.  Patient denies chest pain, shortness of breath, fever, chills.   ED Course:  In the emergency department, BP was 119/43, other vital signs were within normal range.  Workup in the ED showed normal CBC except for WBC of 13.4.  BMP was normal except for blood glucose of 136, BUN 26, albumin 3.4, AST 89, ALT 92, ALP 134, total bilirubin 1.5.  Lipase > 2,800, troponin x 2 was normal. CT abdomen and pelvis with contrast showed acute uncomplicated pancreatitis.  Suspected secondary duodenitis.  Cholelithiasis, without associated inflammatory changes. Chest x-ray showed mild left basilar opacity likely  atelectasis He was treated with IV morphine , Zofran  was given. Gastroenterologist at [facility] (Dr. Stacia) was consulted and recommended MRCP to ensure if patient has choledocholithiasis so as to decide if patient will require ERCP or not.   Review of Systems: Review of systems as noted in the HPI. All other systems reviewed and are negative.   Hospital Course:      Assessment and Plan: 1)Acute Gallstone Pancreatitis Lipase >2,800 CT abdomen and pelvis with contrast on 08/10/2024 showed acute uncomplicated pancreatitis.  MRCP on 08/10/24 showed  a single 1.8 x 2.3 cm gallstone without imaging signs of acute cholecystitis and at least 3, 4-5 mm sized calculi in  the distal CBD. However, there is no intra or extrahepatic bile duct dilation. Discussed with gastroenterologist Dr. Toribio Fortune at [facility] and Dr. Rogelia Copping at [facility] recommended ERCP at [facility] Continue PPI for presumed reactive duodenitis,  - Continue IV fluids and as needed pain medications  RUQ U/S completed but official radiology reading is pending  - Patient will need general surgery consult for lap chole down the road after ERCP and improvement in his pancreatitis symptomatology --Leukocytosis is presumed to be reactive due to above - No evidence of acute cholecystitis -Please get general surgery consult after ERCP to determine timing of lap chole in the near future   2)Acute Transaminitis due to #1 above AST 89, ALT 92, ALP 134 Continue to monitor liver enzymes   3)Hypoalbuminemia possibly due to mild protein calorie malnutrition Albumin 3.4, continue protein supplement   4)Essential hypertension Amlodipine  will be held at this time due to soft BP  5) hypophosphatemia--replace   6)Mixed hyperlipidemia Statin will be held at this time due to transaminasemia   7)Acquired hypothyroidism Continue Synthroid   8)Asthma/reactive airway disease--- no flareup at this time, continue bronchodilators  9)Glucose intolerance--A1c 6.7 on 03/17/2024 --Monitor closely especially while n.p.o. - Recommend Accu-Cheks/fingersticks with conservative sliding scale coverage as needed   Discharge Condition: Stable  Follow UP   Follow-up Information     Copping Rogelia, MD. Schedule an appointment as soon as possible for a visit.   Specialty: Gastroenterology Contact information: [address] [place]  [place] [address] [phone]                  Consults obtained -gastroenterologist Dr. Toribio Fortune and Dr. Rogelia Copping  Diet and Activity recommendation:  As advised  Discharge Instructions    Discharge Instructions     Call MD for:  difficulty breathing,  headache or visual disturbances   Complete by: As directed    Call MD for:  persistant dizziness or light-headedness   Complete by: As directed    Call MD for:  persistant nausea and vomiting   Complete by: As directed    Call MD for:  temperature >100.4   Complete by: As directed    Diet - low sodium heart healthy   Complete by: As directed    Discharge instructions   Complete by: As directed    1)Avoid ibuprofen/Advil/Aleve/Motrin/Goody Powders/Naproxen/BC powders/Meloxicam/Diclofenac /Indomethacin and other Nonsteroidal anti-inflammatory medications as these will make you more likely to bleed and can cause stomach ulcers, can also cause Kidney problems.   2)Transfer to [facility] for ERCP with Dr. Rogelia Copping   Increase activity slowly   Complete by: As directed        Discharge Medications     Allergies as of 08/10/2024   No Known Allergies      Medication List     STOP  taking these medications    famotidine  20 MG tablet Commonly known as: Pepcid    tadalafil  10 MG tablet Commonly known as: CIALIS        TAKE these medications    albuterol  108 (90 Base) MCG/ACT inhaler Commonly known as: VENTOLIN  HFA Inhale 1-2 puffs into the lungs every 6 (six) hours as needed for wheezing or shortness of breath.   amLODipine  5 MG tablet Commonly known as: NORVASC  TAKE 1 TABLET BY MOUTH DAILY   aspirin  EC 81 MG tablet Take 1 tablet (81 mg total) by mouth daily with breakfast. What changed: when to take this   atorvastatin  80 MG tablet Commonly known as: LIPITOR TAKE 1 TABLET BY MOUTH DAILY   Breztri  Aerosphere 160-9-4.8 MCG/ACT Aero inhaler Generic drug: budesonide -glycopyrrolate -formoterol  Inhale 2 puffs into the lungs 2 (two) times daily.   Coenzyme Q10 200 MG capsule Take 200 mg by mouth every evening.   gabapentin  100 MG capsule Commonly known as: NEURONTIN  Take 1 capsule (100 mg total) by mouth at bedtime.   ipratropium 0.06 % nasal spray Commonly known as:  ATROVENT  Place 2 sprays into both nostrils 4 (four) times daily as needed for rhinitis.   levothyroxine  100 MCG tablet Commonly known as: SYNTHROID  TAKE 1 TABLET BY MOUTH DAILY   meclizine  25 MG tablet Commonly known as: ANTIVERT  Take 1 tablet (25 mg total) by mouth 3 (three) times daily as needed for dizziness.   olmesartan  40 MG tablet Commonly known as: BENICAR  TAKE 1 TABLET BY MOUTH DAILY   pantoprazole  40 MG tablet Commonly known as: PROTONIX  Take 1 tablet (40 mg total) by mouth 2 (two) times daily. Take 30-60 min before first meal of the day. PLEASE SCHEDULE OFFICE VISIT What changed: when to take this   Refresh 1.4-0.6 % Soln Generic drug: Polyvinyl Alcohol -Povidone PF Place 1 drop into both eyes daily as needed (dry eye).   vitamin E 180 MG (400 UNITS) capsule Take 400 Units by mouth every evening.       Major procedures and Radiology Reports - PLEASE review detailed and final reports for all details, in brief -   MR ABDOMEN MRCP WO CONTRAST Result Date: 08/10/2024 CLINICAL DATA:  Pancreatitis, acute, severe. EXAM: MRI ABDOMEN WITHOUT CONTRAST  (INCLUDING MRCP) TECHNIQUE: Multiplanar multisequence MR imaging of the abdomen was performed. Heavily T2-weighted images of the biliary and pancreatic ducts were obtained, and three-dimensional MRCP images were rendered by post processing. COMPARISON:  Ultrasound abdomen and CT scan abdomen and pelvis from earlier the same day. FINDINGS: Lower chest: Note is made of elevated right hemidiaphragm with associated compressive atelectatic changes in the right lung base. Otherwise, unremarkable MR appearance to the lung bases. No pleural effusion. No pericardial effusion. Normal heart size. Hepatobiliary: The liver is normal in size. Noncirrhotic configuration. No focal liver lesion. No intra or extrahepatic bile duct dilation. Extrahepatic bile duct measures up to 6 mm, within normal limits for patient's age. However, there are at least 3,  4-5 mm sized calculi in the distal common bile duct. The gallbladder is physiologically distended. There is a single 1.8 x 2.3 cm gallstone. No abnormal wall thickening or pericholecystic fat stranding. Pancreas: There is subtle heterogeneity of the pancreas which exhibit mild surrounding fat stranding, compatible with acute pancreatitis. No peripancreatic collection. No focal pancreatic lesion. Main pancreatic duct is not dilated. Spleen:  Within normal limits in size and appearance. No focal mass. Adrenals/Urinary Tract: Unremarkable adrenal glands. No hydroureteronephrosis. No suspicious renal mass. Stomach/Bowel: Visualized portions within  the abdomen are unremarkable. No disproportionate dilation of bowel loops. Unremarkable appendix. Vascular/Lymphatic: No pathologically enlarged lymph nodes identified. No abdominal aortic aneurysm demonstrated. No ascites. Other:  None. Musculoskeletal: No suspicious bone lesions identified. IMPRESSION: 1. There is subtle heterogeneity of the pancreas which exhibit mild surrounding fat stranding, compatible with acute interstitial pancreatitis. No peripancreatic collection. No focal pancreatic lesion. Main pancreatic duct is not dilated. 2. There is a single 1.8 x 2.3 cm gallstone without imaging signs of acute cholecystitis. 3. There are at least 3, 4-5 mm sized calculi in the distal common bile duct. However, there is no intra or extrahepatic bile duct dilation. 4. Elevated right hemidiaphragm with associated compressive atelectatic changes in the right lung base. Electronically Signed   By: Ree Molt M.D.   On: 08/10/2024 09:25   MR 3D Recon At Scanner Result Date: 08/10/2024 CLINICAL DATA:  Pancreatitis, acute, severe. EXAM: MRI ABDOMEN WITHOUT CONTRAST  (INCLUDING MRCP) TECHNIQUE: Multiplanar multisequence MR imaging of the abdomen was performed. Heavily T2-weighted images of the biliary and pancreatic ducts were obtained, and three-dimensional MRCP images were  rendered by post processing. COMPARISON:  Ultrasound abdomen and CT scan abdomen and pelvis from earlier the same day. FINDINGS: Lower chest: Note is made of elevated right hemidiaphragm with associated compressive atelectatic changes in the right lung base. Otherwise, unremarkable MR appearance to the lung bases. No pleural effusion. No pericardial effusion. Normal heart size. Hepatobiliary: The liver is normal in size. Noncirrhotic configuration. No focal liver lesion. No intra or extrahepatic bile duct dilation. Extrahepatic bile duct measures up to 6 mm, within normal limits for patient's age. However, there are at least 3, 4-5 mm sized calculi in the distal common bile duct. The gallbladder is physiologically distended. There is a single 1.8 x 2.3 cm gallstone. No abnormal wall thickening or pericholecystic fat stranding. Pancreas: There is subtle heterogeneity of the pancreas which exhibit mild surrounding fat stranding, compatible with acute pancreatitis. No peripancreatic collection. No focal pancreatic lesion. Main pancreatic duct is not dilated. Spleen:  Within normal limits in size and appearance. No focal mass. Adrenals/Urinary Tract: Unremarkable adrenal glands. No hydroureteronephrosis. No suspicious renal mass. Stomach/Bowel: Visualized portions within the abdomen are unremarkable. No disproportionate dilation of bowel loops. Unremarkable appendix. Vascular/Lymphatic: No pathologically enlarged lymph nodes identified. No abdominal aortic aneurysm demonstrated. No ascites. Other:  None. Musculoskeletal: No suspicious bone lesions identified. IMPRESSION: 1. There is subtle heterogeneity of the pancreas which exhibit mild surrounding fat stranding, compatible with acute interstitial pancreatitis. No peripancreatic collection. No focal pancreatic lesion. Main pancreatic duct is not dilated. 2. There is a single 1.8 x 2.3 cm gallstone without imaging signs of acute cholecystitis. 3. There are at least 3,  4-5 mm sized calculi in the distal common bile duct. However, there is no intra or extrahepatic bile duct dilation. 4. Elevated right hemidiaphragm with associated compressive atelectatic changes in the right lung base. Electronically Signed   By: Ree Molt M.D.   On: 08/10/2024 09:25   CT ABDOMEN PELVIS W CONTRAST Result Date: 08/10/2024 EXAM: CT ABDOMEN AND PELVIS WITH CONTRAST 08/10/2024 03:33:50 AM TECHNIQUE: CT of the abdomen and pelvis was performed with the administration of intravenous contrast. Multiplanar reformatted images are provided for review. Automated exposure control, iterative reconstruction, and/or weight-based adjustment of the mA/kV was utilized to reduce the radiation dose to as low as reasonably achievable. COMPARISON: 10/09/2019 CLINICAL HISTORY: Pancreatitis suspected. Pt c/o upper abd pain/ substernal chest pain that started about 7pm. Pt states  the pain has been constant and hasn't changed since it started. Denies cardiac hx. FINDINGS: LOWER CHEST: Eventration of the right hemidiaphragm with associated right basilar atelectasis. LIVER: Unremarkable. GALLBLADDER AND BILE DUCTS: Layering 2.0 cm gallstone (image 30), without associated inflammatory changes. SPLEEN: Unremarkable. PANCREAS: Pancreatic inflammatory changes most prominent along the pancreatic tail (image 34), reflecting acute pancreatitis. No walled off fluid collection/pseudocyst. No pancreatic necrosis. ADRENAL GLANDS: Unremarkable. KIDNEYS, URETERS AND BLADDER: Kidneys are within normal limits. No renal, ureteral, or bladder calculi. No hydronephrosis. Bladder is within normal limits. GI AND BOWEL: Secondary inflammatory changes involving the second/third portion of the duodenum (image 50), suggesting superimposed duodenitis. Normal appendix (image 58). Left colonic diverticulosis, without evidence of diverticulitis. PERITONEUM AND RETROPERITONEUM: No ascites or free air. VASCULATURE: Thoracic aortic atherosclerosis.  Atherosclerotic calcifications of the abdominal aorta and branch vessels. Mild coronary artery insufficiency of the LAD and right coronary artery. LYMPH NODES: No lymphadenopathy. REPRODUCTIVE ORGANS: Prostate is notable for dystrophic calcifications. BONES AND SOFT TISSUES: Mild degenerative changes of the lumbar spine. IMPRESSION: 1. Acute uncomplicated pancreatitis. 2. Suspected secondary duodenitis. 3. Cholelithiasis, without associated inflammatory changes. Electronically signed by: Pinkie Pebbles MD 08/10/2024 03:40 AM EDT RP Workstation: [id number]   DG Chest Portable 1 View Result Date: 08/10/2024 EXAM: 1 VIEW XRAY OF THE CHEST 08/10/2024 12:27:39 AM COMPARISON: 06/20/2019 CLINICAL HISTORY: Chest pain. Pt c/o upper abd pain/ substernal chest pain that started about 7pm. Pt states the pain has been constant and hasn't changed since it started. Denies cardiac hx. FINDINGS: LUNGS AND PLEURA: Mild left basilar opacity, likely atelectasis. No pulmonary edema. No pleural effusion. No pneumothorax. HEART AND MEDIASTINUM: No acute abnormality of the cardiac and mediastinal silhouettes. BONES AND SOFT TISSUES: Stable mild eventration of the right hemidiaphragm. No acute osseous abnormality. IMPRESSION: 1. Mild left basilar opacity, likely atelectasis. Electronically signed by: Pinkie Pebbles MD 08/10/2024 12:30 AM EDT RP Workstation: [id number]   Today   Subjective    Bettyann Sor today has no new complaints No fever  Or chills  - Nausea but no emesis - Abdominal discomfort is improved Dr Micheline (Gi is at bedside), questions answered   Patient has been seen and examined prior to discharge   Objective   Blood pressure (!) 107/54, pulse 79, temperature 98.8 F (37.1 C), temperature source Oral, resp. rate 18, height 5' 8 (1.727 m), weight 85.3 kg, SpO2 92%.   Intake/Output Summary (Last 24 hours) at 08/10/2024 1131 Last data filed at 08/10/2024 0658 Gross per 24 hour  Intake 18.93 ml   Output --  Net 18.93 ml   Exam Gen:- Awake Alert, no acute distress  HEENT:- [place].AT, No sclera icterus Neck-Supple Neck,No JVD,.  Lungs-  CTAB , good air movement bilaterally CV- S1, S2 normal, regular Abd-  +ve B.Sounds, Abd Soft, Epigastric discomfort on palpation, no rebound or guarding,    Extremity/Skin:- No  edema,   good pulses Psych-affect is appropriate, oriented x3 Neuro-no new focal deficits, no tremors    Data Review   CBC w Diff:  Lab Results  Component Value Date   WBC 13.4 (H) 08/10/2024   HGB 13.4 08/10/2024   HGB 15.2 09/13/2023   HCT 40.1 08/10/2024   HCT 46.0 09/13/2023   PLT 253 08/10/2024   PLT 223 09/13/2023   LYMPHOPCT 6 08/10/2024   MONOPCT 3 08/10/2024   EOSPCT 0 08/10/2024   BASOPCT 0 08/10/2024   CMP:  Lab Results  Component Value Date   NA 141 08/10/2024   NA  140 03/17/2024   K 3.5 08/10/2024   CL 105 08/10/2024   CO2 25 08/10/2024   BUN 26 (H) 08/10/2024   BUN 17 03/17/2024   CREATININE 0.80 08/10/2024   CREATININE 0.80 01/15/2015   PROT 5.9 (L) 08/10/2024   PROT 6.2 03/17/2024   ALBUMIN 3.4 (L) 08/10/2024   ALBUMIN 4.4 03/17/2024   BILITOT 1.5 (H) 08/10/2024   BILITOT 0.5 03/17/2024   ALKPHOS 134 (H) 08/10/2024   AST 89 (H) 08/10/2024   ALT 92 (H) 08/10/2024   Total Discharge time is about 33 minutes  Rendall Carwin M.D on 08/10/2024 at 11:31 AM  Go to [email or web address] -  for contact info  [facility] - Office  [phone]

## 2024-08-10 NOTE — Progress Notes (Addendum)
   08/10/24 1307  TOC Brief Assessment  Insurance and Status Reviewed  Patient has primary care physician Yes  Home environment has been reviewed Home with spouse  Prior level of function: Independent  Prior/Current Home Services No current home services  Social Drivers of Health Review SDOH reviewed no interventions necessary  Readmission risk has been reviewed Yes  Transition of care needs no transition of care needs at this time   Discharging home, no needs.  Transition of Care Department [facility]) has reviewed patient and no TOC needs have been identified at this time. We will continue to monitor patient advancement through interdisciplinary progression rounds. If new patient transition needs arise, please place a TOC consult.

## 2024-08-11 ENCOUNTER — Encounter: Payer: Self-pay | Admitting: Gastroenterology

## 2024-08-11 ENCOUNTER — Inpatient Hospital Stay: Admitting: Anesthesiology

## 2024-08-11 ENCOUNTER — Inpatient Hospital Stay

## 2024-08-11 ENCOUNTER — Encounter
Admission: AD | Disposition: A | Discharge status: Home / Self Care | Payer: Self-pay | Source: Other Acute Inpatient Hospital | Attending: Student

## 2024-08-11 DIAGNOSIS — K8050 Calculus of bile duct without cholangitis or cholecystitis without obstruction: Secondary | ICD-10-CM | POA: Diagnosis not present

## 2024-08-11 DIAGNOSIS — K851 Biliary acute pancreatitis without necrosis or infection: Secondary | ICD-10-CM

## 2024-08-11 HISTORY — PX: ERCP: SHX5425

## 2024-08-11 LAB — COMPREHENSIVE METABOLIC PANEL WITH GFR
ALT: 90 U/L — ABNORMAL HIGH (ref 0–44)
AST: 54 U/L — ABNORMAL HIGH (ref 15–41)
Albumin: 2.9 g/dL — ABNORMAL LOW (ref 3.5–5.0)
Alkaline Phosphatase: 97 U/L (ref 38–126)
Anion gap: 7 (ref 5–15)
BUN: 21 mg/dL (ref 8–23)
CO2: 25 mmol/L (ref 22–32)
Calcium: 8.7 mg/dL — ABNORMAL LOW (ref 8.9–10.3)
Chloride: 103 mmol/L (ref 98–111)
Creatinine, Ser: 0.81 mg/dL (ref 0.61–1.24)
GFR, Estimated: >60 mL/min (ref >60)
Glucose, Bld: 78 mg/dL (ref 70–99)
Potassium: 3.9 mmol/L (ref 3.5–5.1)
Sodium: 135 mmol/L (ref 135–145)
Total Bilirubin: 3.5 mg/dL — ABNORMAL HIGH (ref 0.0–1.2)
Total Protein: 5.6 g/dL — ABNORMAL LOW (ref 6.5–8.1)

## 2024-08-11 LAB — GLUCOSE, CAPILLARY
Glucose-Capillary: 83 mg/dL (ref 70–99)
Glucose-Capillary: 138 mg/dL — ABNORMAL HIGH (ref 70–99)
Glucose-Capillary: 63 mg/dL — ABNORMAL LOW (ref 70–99)
Glucose-Capillary: 204 mg/dL — ABNORMAL HIGH (ref 70–99)

## 2024-08-11 LAB — CBC
HCT: 36.6 % — ABNORMAL LOW (ref 39.0–52.0)
Hemoglobin: 12 g/dL — ABNORMAL LOW (ref 13.0–17.0)
MCH: 31.8 pg (ref 26.0–34.0)
MCHC: 32.8 g/dL (ref 30.0–36.0)
MCV: 97.1 fL (ref 80.0–100.0)
Platelets: 213 K/uL (ref 150–400)
RBC: 3.77 MIL/uL — ABNORMAL LOW (ref 4.22–5.81)
RDW: 13 % (ref 11.5–15.5)
WBC: 9.2 K/uL (ref 4.0–10.5)
nRBC: 0 % (ref 0.0–0.2)

## 2024-08-11 LAB — LIPASE, BLOOD: Lipase: 299 U/L — ABNORMAL HIGH (ref 11–51)

## 2024-08-11 SURGERY — ERCP, WITH INTERVENTION IF INDICATED
Anesthesia: General

## 2024-08-11 MED ORDER — PROPOFOL 10 MG/ML IV BOLUS
INTRAVENOUS | Status: DC | PRN
Start: 2024-08-11 — End: 2024-08-11
  Administered 2024-08-11 (×2): 50 mg via INTRAVENOUS

## 2024-08-11 MED ORDER — DICLOFENAC SUPPOSITORY 100 MG: 100.0000 mg | Freq: Once | RECTAL | Status: DC

## 2024-08-11 MED ORDER — DICLOFENAC SUPPOSITORY 100 MG
RECTAL | Status: AC
  Filled 2024-08-11: qty 1

## 2024-08-11 MED ORDER — GLYCOPYRROLATE 0.2 MG/ML IJ SOLN
INTRAMUSCULAR | Status: DC | PRN
  Administered 2024-08-11: .2 mg via INTRAVENOUS

## 2024-08-11 MED ORDER — PROPOFOL 500 MG/50ML IV EMUL
INTRAVENOUS | Status: DC | PRN
  Administered 2024-08-11: 100 ug/kg/min via INTRAVENOUS

## 2024-08-11 MED ORDER — DEXTROSE 50 % IV SOLN
12.5000 g | INTRAVENOUS | Status: AC
Start: 2024-08-11 — End: 2024-08-11
  Administered 2024-08-11: 12.5 g via INTRAVENOUS
  Filled 2024-08-11: qty 50

## 2024-08-11 MED ORDER — ALBUTEROL SULFATE (2.5 MG/3ML) 0.083% IN NEBU: 2.5000 mg | INHALATION_SOLUTION | Freq: Four times a day (QID) | RESPIRATORY_TRACT | Status: DC | PRN

## 2024-08-11 MED ORDER — AMLODIPINE BESYLATE 5 MG PO TABS
5.0000 mg | ORAL_TABLET | Freq: Every day | ORAL | Status: DC
  Administered 2024-08-12 – 2024-08-14 (×2): 5 mg via ORAL
  Filled 2024-08-11 (×2): qty 1

## 2024-08-11 MED ORDER — LIDOCAINE HCL (CARDIAC) PF 100 MG/5ML IV SOSY
PREFILLED_SYRINGE | INTRAVENOUS | Status: DC | PRN
  Administered 2024-08-11: 40 mg via INTRAVENOUS

## 2024-08-11 MED ORDER — LACTATED RINGERS IV SOLN: INTRAVENOUS | Status: DC | PRN

## 2024-08-11 NOTE — Plan of Care (Signed)
   Problem: Clinical Measurements: Goal: Ability to maintain clinical measurements within normal limits will improve Outcome: Progressing Goal: Will remain free from infection Outcome: Progressing Goal: Respiratory complications will improve Outcome: Progressing

## 2024-08-11 NOTE — Transfer of Care (Signed)
 Immediate Anesthesia Transfer of Care Note  Patient: Cameron Smith  Procedure(s) Performed: ERCP, WITH INTERVENTION IF INDICATED  Patient Location: PACU  Anesthesia Type:General  Level of Consciousness: drowsy  Airway & Oxygen Therapy: Patient Spontanous Breathing  Post-op Assessment: Report given to RN and Post -op Vital signs reviewed and stable  Post vital signs: Reviewed and stable  Last Vitals:  Vitals Value Taken Time  BP 106/46 1340  Temp    Pulse 73 1340  Resp 18 1340  SpO2 97 1340    Last Pain:  Vitals:   08/11/24 1203  TempSrc: Temporal  PainSc: 0-No pain         Complications: No notable events documented.

## 2024-08-11 NOTE — Hospital Course (Addendum)
 Mild ingestion.  Toelratign apple juice and water .      Thyroid  and cholesterol   Amlodipine  5mg   Levothyroxie 100mg  Babyaspirin  Lisinopril  Atorvastatin  80mg     Breztri .   Nothing to eat since Saturday.   No bms since Saturday. Usually 2-3/d

## 2024-08-11 NOTE — Op Note (Signed)
 [facility] Patient Name: Cameron Smith Procedure Date: 08/11/2024 1:02 PM MRN: [id number] Account #: [id number] Date of Birth: May 09, 1941 Admit Type: Inpatient Age: 83 Room: [facility] ENDO ROOM 4 Gender: Male Note Status: Finalized Instrument Name: [id number] Procedure:             ERCP Indications:           Bile duct stone(s) Providers:             Rogelia Copping MD, MD Referring MD:          Rendall Carwin (Referring MD), Jayce G. Bluford MD, MD                         (Referring MD) Medicines:             Propofol  per Anesthesia Complications:         No immediate complications. Procedure:             Pre-Anesthesia Assessment:                        - Prior to the procedure, a History and Physical was                         performed, and patient medications and allergies were                         reviewed. The patient's tolerance of previous                         anesthesia was also reviewed. The risks and benefits                         of the procedure and the sedation options and risks                         were discussed with the patient. All questions were                         answered, and informed consent was obtained. Prior                         Anticoagulants: The patient has taken no anticoagulant                         or antiplatelet agents. ASA Grade Assessment: II - A                         patient with mild systemic disease. After reviewing                         the risks and benefits, the patient was deemed in                         satisfactory condition to undergo the procedure.                        After obtaining informed consent, the scope was passed  under direct vision. Throughout the procedure, the                         patient's blood pressure, pulse, and oxygen                         saturations were monitored continuously. The                         Duodenoscope was  introduced through the mouth, and                         used to inject contrast into and used to cannulate the                         bile duct. The ERCP was accomplished without                         difficulty. The patient tolerated the procedure well. Findings:      The scout film was normal. The esophagus was successfully intubated       under direct vision. The scope was advanced to a normal major papilla in       the descending duodenum without detailed examination of the pharynx,       larynx and associated structures, and upper GI tract. The upper GI tract       was grossly normal. The bile duct was deeply cannulated. Contrast was       injected. I personally interpreted the bile duct images. There was brisk       flow of contrast through the ducts. Image quality was excellent.       Contrast extended to the entire biliary tree. The lower third of the       main bile duct contained filling defect(s) thought to be a stone. A wire       was passed into the biliary tree. A 7 mm biliary sphincterotomy was made       with a traction (standard) sphincterotome using ERBE electrocautery.       There was no post-sphincterotomy bleeding. The biliary tree was swept       with a 15 mm balloon starting at the bifurcation. Three stones were       removed. No stones remained. Impression:            - A filling defect consistent with a stone was seen on                         the cholangiogram.                        - Choledocholithiasis was found. Complete removal was                         accomplished by biliary sphincterotomy and balloon                         extraction.                        - A biliary sphincterotomy was performed.                        -  The biliary tree was swept. Recommendation:        - Return patient to hospital ward for ongoing care.                        - Clear liquid diet.                        - Watch for pancreatitis, bleeding, perforation, and                          cholangitis.                        - Refer to a Careers adviser. Procedure Code(s):     --- Professional ---                        [phone], Endoscopic retrograde cholangiopancreatography                         (ERCP); with removal of calculi/debris from                         biliary/pancreatic duct(s)                        43262, Endoscopic retrograde cholangiopancreatography                         (ERCP); with sphincterotomy/papillotomy                        [id number], Endoscopic catheterization of the biliary                         ductal system, radiological supervision and                         interpretation Diagnosis Code(s):     --- Professional ---                        K80.50, Calculus of bile duct without cholangitis or                         cholecystitis without obstruction CPT copyright 2022 American Medical Association. All rights reserved. The codes documented in this report are preliminary and upon coder review may  be revised to meet current compliance requirements. Rogelia Copping MD, MD 08/11/2024 1:30:44 PM This report has been signed electronically. Number of Addenda: 0 Note Initiated On: 08/11/2024 1:02 PM Estimated Blood Loss:  Estimated blood loss: none.      [facility]

## 2024-08-11 NOTE — Progress Notes (Signed)
 Progress Note   Patient: Cameron Smith FMW:[id number] DOB: 1941/06/12 DOA: 08/10/2024     1 DOS: the patient was seen and examined on 08/11/2024   Brief hospital course: Per H&P HPI: Cameron Smith is a 83 y.o. male with medical history significant of HTN, HLD, hypothyroidism, IIDM, presented with acute RLQ abdominal pain.   Patient reported that she has been having intermittent RUQ abdominal pain since last week, usually after eating dinner, associated with burping and nausea but no vomiting, pain usually subsides by its own without any intervention medication.  Last night however patient started to have 10/10 cramping pain, nonstop and came to ED.  Denied any fever or chills, no chest pain or shortness of breath. ED Course: Borderline tachycardia blood pressure 119/43, lipase was 2800, troponin negative x 2, AST 89 ALT 92 total bilirubin 1.5.  CT abdomen pelvis showed acute uncomplicated pancreatitis and suspected secondary duodenitis, cholelithiasis without inflammatory changes, MRCP positive for gallstone and distal CBD stone choledocholithiasis.   Patient was given IV fluid and pain medication in the ED at [facility] and transferred to [facility] for ERCP.  Assessment and Plan: Acute gallstone pancreatis Status post ERCP General Surgery to see patient Continue antibiotics for now Follow CMP in the a.m.  Hypertension On amlodipine  and olmesartan  at home Will resume amlodipine  and continue to trend Bps  HLD Resume atorvastatin  on discharge   Hypothyroidism Resume Levothyroxine      Subjective: Pain is significantly improved, able to tolerate drinking clear liquids would like diet advanced.  Physical Exam: Vitals:   08/11/24 1340 08/11/24 1355 08/11/24 1408 08/11/24 1703  BP: (!) 122/56 (!) 122/56 (!) 143/60 (!) 153/62  Pulse: 69 70 71 72  Resp: 16 (!) 21 20 16   Temp: (!) 97.5 F (36.4 C)   98.6 F (37 C)  TempSrc:      SpO2: 95% 95% 96% 95%  Weight:      Height:        Physical Exam  Constitutional: In no distress.  Cardiovascular: Normal rate, regular rhythm. No lower extremity edema  Pulmonary: Non labored breathing on room air, no wheezing or rales.   Abdominal: Soft. Non distended and non tender Musculoskeletal: Normal range of motion.     Neurological: Alert and oriented to person, place, and time. Non focal  Skin: Skin is warm and dry.   Data Reviewed:     Latest Ref Rng & Units 08/11/2024    4:04 AM 08/10/2024   12:16 AM 09/13/2023    8:24 AM  CBC  WBC 4.0 - 10.5 K/uL 9.2  13.4  8.2   Hemoglobin 13.0 - 17.0 g/dL 87.9  86.5  84.7   Hematocrit 39.0 - 52.0 % 36.6  40.1  46.0   Platelets 150 - 400 K/uL 213  253  223        Latest Ref Rng & Units 08/11/2024    4:04 AM 08/10/2024    2:14 AM 08/10/2024   12:16 AM  BMP  Glucose 70 - 99 mg/dL 78   863   BUN 8 - 23 mg/dL 21   26   Creatinine 9.38 - 1.24 mg/dL 9.18  9.19  9.14   Sodium 135 - 145 mmol/L 135   141   Potassium 3.5 - 5.1 mmol/L 3.9   3.5   Chloride 98 - 111 mmol/L 103   105   CO2 22 - 32 mmol/L 25   25   Calcium  8.9 - 10.3 mg/dL  8.7   9.2     Family Communication: Family at bedside   Disposition: Status is: Inpatient Remains inpatient appropriate because: awaiting possible cholcysteomy   Planned Discharge Destination: Home    Time spent: 35 minutes  Author: Alban Pepper, MD 08/11/2024 6:49 PM  For on call review [email or web address].

## 2024-08-11 NOTE — Anesthesia Postprocedure Evaluation (Signed)
 Anesthesia Post Note  Patient: VIVEK GREALISH  Procedure(s) Performed: ERCP, WITH INTERVENTION IF INDICATED  Patient location during evaluation: PACU Anesthesia Type: General Level of consciousness: awake and alert Pain management: pain level controlled Vital Signs Assessment: post-procedure vital signs reviewed and stable Respiratory status: spontaneous breathing, nonlabored ventilation, respiratory function stable and patient connected to nasal cannula oxygen Cardiovascular status: blood pressure returned to baseline and stable Postop Assessment: no apparent nausea or vomiting Anesthetic complications: no   No notable events documented.   Last Vitals:  Vitals:   08/11/24 0843 08/11/24 1203  BP:  (!) 153/58  Pulse:  78  Resp:  16  Temp:  (!) 36.2 C  SpO2: 94% 95%    Last Pain:  Vitals:   08/11/24 1203  TempSrc: Temporal  PainSc: 0-No pain                 Lynwood KANDICE Clause

## 2024-08-11 NOTE — Anesthesia Preprocedure Evaluation (Signed)
 Anesthesia Evaluation  Patient identified by MRN, date of birth, ID band Patient awake    Reviewed: Allergy & Precautions, H&P , NPO status , Patient's Chart, lab work & pertinent test results, reviewed documented beta blocker date and time   Airway Mallampati: II   Neck ROM: full    Dental  (+) Poor Dentition   Pulmonary neg pulmonary ROS, former smoker   Pulmonary exam normal        Cardiovascular Exercise Tolerance: Poor hypertension, negative cardio ROS Normal cardiovascular exam Rhythm:regular Rate:Normal     Neuro/Psych   Anxiety     negative neurological ROS  negative psych ROS   GI/Hepatic Neg liver ROS,GERD  Medicated,,  Endo/Other  diabetesHypothyroidism    Renal/GU negative Renal ROS  negative genitourinary   Musculoskeletal   Abdominal   Peds  Hematology negative hematology ROS (+)   Anesthesia Other Findings Past Medical History: No date: Acute inflammation of nasal sinus     Comment:  Chronic No date: Anxiety No date: Diverticulitis No date: Hypercholesteremia No date: Hypertension No date: Hypothyroidism No date: Nasal turbinate hypertrophy No date: Reactive airways dysfunction syndrome (HCC) Past Surgical History: No date: BACK SURGERY     Comment:  sciatic 10/31/2011: COLONOSCOPY     Comment:  Adequate prep, extensive left-sided diverticula, colon               otherwise normal. 04/19/2022: COLONOSCOPY WITH PROPOFOL ; N/A     Comment:  Procedure: COLONOSCOPY WITH PROPOFOL ;  Surgeon: Shaaron Lamar HERO, MD;  Location: [facility] ENDO SUITE;  Service:               Endoscopy;  Laterality: N/A;  11:15am 08/22/2019: NASAL SEPTOPLASTY W/ TURBINOPLASTY; Bilateral     Comment:  Procedure: Sinus Endoscopy   Nasal Septoplasty with               Bilateral TURBINATE REDUCTION, Bilateral Total               Ethmoidectomy and Sphenoidotomy;  Surgeon: Carlie Clark,              MD;  Location: [facility]  OR;  Service: ENT;  Laterality:               Bilateral; No date: NASAL SINUS SURGERY BMI    Body Mass Index: 28.59 kg/m     Reproductive/Obstetrics negative OB ROS                              Anesthesia Physical Anesthesia Plan  ASA: 3  Anesthesia Plan: General   Post-op Pain Management:    Induction:   PONV Risk Score and Plan:   Airway Management Planned:   Additional Equipment:   Intra-op Plan:   Post-operative Plan:   Informed Consent: I have reviewed the patients History and Physical, chart, labs and discussed the procedure including the risks, benefits and alternatives for the proposed anesthesia with the patient or authorized representative who has indicated his/her understanding and acceptance.     Dental Advisory Given  Plan Discussed with: CRNA  Anesthesia Plan Comments:         Anesthesia Quick Evaluation

## 2024-08-12 ENCOUNTER — Encounter: Payer: Self-pay | Admitting: Internal Medicine

## 2024-08-12 DIAGNOSIS — K851 Biliary acute pancreatitis without necrosis or infection: Secondary | ICD-10-CM | POA: Diagnosis not present

## 2024-08-12 LAB — COMPREHENSIVE METABOLIC PANEL WITH GFR
ALT: 66 U/L — ABNORMAL HIGH (ref 0–44)
AST: 39 U/L (ref 15–41)
Albumin: 3 g/dL — ABNORMAL LOW (ref 3.5–5.0)
Alkaline Phosphatase: 126 U/L (ref 38–126)
Anion gap: 7 (ref 5–15)
BUN: 17 mg/dL (ref 8–23)
CO2: 26 mmol/L (ref 22–32)
Calcium: 8.6 mg/dL — ABNORMAL LOW (ref 8.9–10.3)
Chloride: 106 mmol/L (ref 98–111)
Creatinine, Ser: 1 mg/dL (ref 0.61–1.24)
GFR, Estimated: >60 mL/min (ref >60)
Glucose, Bld: 125 mg/dL — ABNORMAL HIGH (ref 70–99)
Potassium: 3.8 mmol/L (ref 3.5–5.1)
Sodium: 139 mmol/L (ref 135–145)
Total Bilirubin: 1.9 mg/dL — ABNORMAL HIGH (ref 0.0–1.2)
Total Protein: 5.8 g/dL — ABNORMAL LOW (ref 6.5–8.1)

## 2024-08-12 LAB — CBC
HCT: 35.9 % — ABNORMAL LOW (ref 39.0–52.0)
Hemoglobin: 12 g/dL — ABNORMAL LOW (ref 13.0–17.0)
MCH: 32.3 pg (ref 26.0–34.0)
MCHC: 33.4 g/dL (ref 30.0–36.0)
MCV: 96.5 fL (ref 80.0–100.0)
Platelets: 184 K/uL (ref 150–400)
RBC: 3.72 MIL/uL — ABNORMAL LOW (ref 4.22–5.81)
RDW: 12.7 % (ref 11.5–15.5)
WBC: 7.2 K/uL (ref 4.0–10.5)
nRBC: 0 % (ref 0.0–0.2)

## 2024-08-12 LAB — GLUCOSE, CAPILLARY
Glucose-Capillary: 107 mg/dL — ABNORMAL HIGH (ref 70–99)
Glucose-Capillary: 171 mg/dL — ABNORMAL HIGH (ref 70–99)
Glucose-Capillary: 172 mg/dL — ABNORMAL HIGH (ref 70–99)
Glucose-Capillary: 126 mg/dL — ABNORMAL HIGH (ref 70–99)

## 2024-08-12 MED ORDER — IRBESARTAN 150 MG PO TABS
300.0000 mg | ORAL_TABLET | Freq: Every day | ORAL | Status: DC
  Administered 2024-08-12 – 2024-08-14 (×2): 300 mg via ORAL
  Filled 2024-08-12 (×2): qty 2

## 2024-08-12 NOTE — Progress Notes (Signed)
 Progress Note   Patient: Cameron Smith FMW:[id number] DOB: 08-09-1941 DOA: 08/10/2024     2 DOS: the patient was seen and examined on 08/12/2024   Brief hospital course: Per H&P HPI: Cameron Smith is a 83 y.o. male with medical history significant of HTN, HLD, hypothyroidism, IIDM, presented with acute RLQ abdominal pain.   Patient reported that she has been having intermittent RUQ abdominal pain since last week, usually after eating dinner, associated with burping and nausea but no vomiting, pain usually subsides by its own without any intervention medication.  Last night however patient started to have 10/10 cramping pain, nonstop and came to ED.  Denied any fever or chills, no chest pain or shortness of breath. ED Course: Borderline tachycardia blood pressure 119/43, lipase was 2800, troponin negative x 2, AST 89 ALT 92 total bilirubin 1.5.  CT abdomen pelvis showed acute uncomplicated pancreatitis and suspected secondary duodenitis, cholelithiasis without inflammatory changes, MRCP positive for gallstone and distal CBD stone choledocholithiasis.   Patient was given IV fluid and pain medication in the ED at [facility] and transferred to [facility] for ERCP.  Assessment and Plan: Acute gallstone pancreatis Status post ERCP 9/22 LFTs improved General Surgery consulted, cholecystectomy in the a.m. Will stop antibiotics after surgery    Hypertension Continue home amlodipine  and formulary equivalent of home ARB  HLD Resume atorvastatin  on discharge   Hypothyroidism Continue home levothyroxine      Subjective: Patient has no pain.  He is eager to walk around the hospital.  He has been tolerating full liquids without any nausea or vomiting.  Physical Exam: Vitals:   08/11/24 2013 08/12/24 0452 08/12/24 0736 08/12/24 1450  BP: (!) 144/91 (!) 147/69 137/74 (!) 147/54  Pulse: 73 73 74 68  Resp: 18 18 15 15   Temp: 98.7 F (37.1 C) 98.6 F (37 C) 98.8 F (37.1 C) 99 F (37.2 C)   TempSrc: Oral Oral Oral Oral  SpO2: 94% 96% 95% 96%  Weight:      Height:       Physical Exam  Constitutional: In no distress.  Cardiovascular: Normal rate, regular rhythm. No lower extremity edema  Pulmonary: Non labored breathing on room air, no wheezing or rales.   Abdominal: Soft. Non distended and non tender Musculoskeletal: Normal range of motion.     Neurological: Alert and oriented to person, place, and time. Non focal  Skin: Skin is warm and dry.    Data Reviewed:     Latest Ref Rng & Units 08/12/2024    2:32 AM 08/11/2024    4:04 AM 08/10/2024   12:16 AM  CBC  WBC 4.0 - 10.5 K/uL 7.2  9.2  13.4   Hemoglobin 13.0 - 17.0 g/dL 87.9  87.9  86.5   Hematocrit 39.0 - 52.0 % 35.9  36.6  40.1   Platelets 150 - 400 K/uL 184  213  253        Latest Ref Rng & Units 08/12/2024    2:32 AM 08/11/2024    4:04 AM 08/10/2024    2:14 AM  BMP  Glucose 70 - 99 mg/dL 874  78    BUN 8 - 23 mg/dL 17  21    Creatinine 9.38 - 1.24 mg/dL 8.99  9.18  9.19   Sodium 135 - 145 mmol/L 139  135    Potassium 3.5 - 5.1 mmol/L 3.8  3.9    Chloride 98 - 111 mmol/L 106  103    CO2  22 - 32 mmol/L 26  25    Calcium  8.9 - 10.3 mg/dL 8.6  8.7      Family Communication: None at bedside   Disposition: Status is: Inpatient Remains inpatient appropriate because: awaiting possible cholcysteomy   Planned Discharge Destination: Home    Time spent: 35 minutes  Author: Alban Pepper, MD 08/12/2024 5:31 PM  For on call review [email or web address].

## 2024-08-12 NOTE — Progress Notes (Signed)
 Progress Note   Patient: Cameron Smith FMW:[id number] DOB: 22-Dec-1940 DOA: 08/10/2024     2 DOS: the patient was seen and examined on 08/12/2024   Brief hospital course: Per H&P HPI: Cameron Smith is a 83 y.o. male with medical history significant of HTN, HLD, hypothyroidism, IIDM, presented with acute RLQ abdominal pain.   Patient reported that she has been having intermittent RUQ abdominal pain since last week, usually after eating dinner, associated with burping and nausea but no vomiting, pain usually subsides by its own without any intervention medication.  Last night however patient started to have 10/10 cramping pain, nonstop and came to ED.  Denied any fever or chills, no chest pain or shortness of breath. ED Course: Borderline tachycardia blood pressure 119/43, lipase was 2800, troponin negative x 2, AST 89 ALT 92 total bilirubin 1.5.  CT abdomen pelvis showed acute uncomplicated pancreatitis and suspected secondary duodenitis, cholelithiasis without inflammatory changes, MRCP positive for gallstone and distal CBD stone choledocholithiasis.   Patient was given IV fluid and pain medication in the ED at [facility] and transferred to [facility] for ERCP.  Assessment and Plan: Acute gallstone pancreatis Status post ERCP LFTs improving.  General Surgery  OR tomorrow at 1500 Continue antibiotics for now   Hypertension On amlodipine  and olmesartan  at home Will resume amlodipine  and continue to trend Bps  HLD Resume atorvastatin  on discharge   Hypothyroidism Continue Levothyroxine      Subjective: Tolerating full liquid. Will advance to soft diet. No N/V. No diarrhea.   Physical Exam: Vitals:   08/11/24 1703 08/11/24 2013 08/12/24 0452 08/12/24 0736  BP: (!) 153/62 (!) 144/91 (!) 147/69 137/74  Pulse: 72 73 73 74  Resp: 16 18 18 15   Temp: 98.6 F (37 C) 98.7 F (37.1 C) 98.6 F (37 C) 98.8 F (37.1 C)  TempSrc:  Oral Oral Oral  SpO2: 95% 94% 96% 95%  Weight:       Height:       Physical Exam  Constitutional: In no distress.  Cardiovascular: Normal rate, regular rhythm. No lower extremity edema  Pulmonary: Non labored breathing on room air, no wheezing or rales.   Abdominal: Soft. Non distended and non tender Musculoskeletal: Normal range of motion.     Neurological: Alert and oriented to person, place, and time. Non focal  Skin: Skin is warm and dry.   Data Reviewed:     Latest Ref Rng & Units 08/12/2024    2:32 AM 08/11/2024    4:04 AM 08/10/2024   12:16 AM  CBC  WBC 4.0 - 10.5 K/uL 7.2  9.2  13.4   Hemoglobin 13.0 - 17.0 g/dL 87.9  87.9  86.5   Hematocrit 39.0 - 52.0 % 35.9  36.6  40.1   Platelets 150 - 400 K/uL 184  213  253        Latest Ref Rng & Units 08/12/2024    2:32 AM 08/11/2024    4:04 AM 08/10/2024    2:14 AM  BMP  Glucose 70 - 99 mg/dL 874  78    BUN 8 - 23 mg/dL 17  21    Creatinine 9.38 - 1.24 mg/dL 8.99  9.18  9.19   Sodium 135 - 145 mmol/L 139  135    Potassium 3.5 - 5.1 mmol/L 3.8  3.9    Chloride 98 - 111 mmol/L 106  103    CO2 22 - 32 mmol/L 26  25    Calcium   8.9 - 10.3 mg/dL 8.6  8.7      Family Communication: None at bedside  Disposition: Status is: Inpatient Remains inpatient appropriate because: awaiting possible cholcysteomy   Planned Discharge Destination: Home    Time spent: 35 minutes  Author: Alban Pepper, MD 08/12/2024 9:19 AM  For on call review [email or web address]

## 2024-08-12 NOTE — Plan of Care (Signed)

## 2024-08-12 NOTE — Consult Note (Signed)
 [facility]-General Surgery  SURGICAL CONSULTATION NOTE    HISTORY OF PRESENT ILLNESS (HPI):  83 y.o. male presented to [facility] ED for abdominal pain.  Patient has been experiencing intermittent abdominal pain for about a week.  Before going to the ED, the pain had intensified. States the pain was 10/10 and was constant.  No known alleviating or aggravating factors.  Denies any nausea or vomiting associated with the pain.  Denies any changes in bowel habits.  At that ED visit, lipase was 2800, AST was 89, ALT 92, total bilirubin was 1.5. CT of abdomen showed acute uncomplicated pancreatitis, suspected secondary duodenitis, and cholelithiasis, without associated inflammatory changes. MRCP was positive for choledocholithiasis.  He was given IV fluids and pain medication at the ED and he Pentam and was then transferred to [facility] for ERCP.  ERCP was performed yesterday, choledocholithiasis and complete removal was accomplished by biliary sphincterotomy and balloon extraction. S/p day 1 of ERCP, patient is overall doing well.  He denies any abdominal pain this morning.  LFTs and total bilirubin are improving. Does not have any complaints this morning. He is inquiring about gallbladder surgery.  Surgery is consulted by Dr. Ninfa in this context for evaluation and management of choledocholithiasis.  PAST MEDICAL HISTORY (PMH):  Past Medical History:  Diagnosis Date   Acute inflammation of nasal sinus    Chronic   Anxiety    Diverticulitis    Hypercholesteremia    Hypertension    Hypothyroidism    Nasal turbinate hypertrophy    Reactive airways dysfunction syndrome (HCC)      PAST SURGICAL HISTORY (PSH):  Past Surgical History:  Procedure Laterality Date   BACK SURGERY     sciatic   COLONOSCOPY  10/31/2011   Adequate prep, extensive left-sided diverticula, colon otherwise normal.   COLONOSCOPY WITH PROPOFOL  N/A 04/19/2022   Procedure: COLONOSCOPY WITH PROPOFOL ;  Surgeon: Shaaron Lamar HERO, MD;  Location: [facility];  Service: Endoscopy;  Laterality: N/A;  11:15am   ERCP N/A 08/11/2024   Procedure: ERCP, WITH INTERVENTION IF INDICATED;  Surgeon: Jinny Carmine, MD;  Location: [facility] ENDOSCOPY;  Service: Endoscopy;  Laterality: N/A;   NASAL SEPTOPLASTY W/ TURBINOPLASTY Bilateral 08/22/2019   Procedure: Sinus Endoscopy   Nasal Septoplasty with Bilateral TURBINATE REDUCTION, Bilateral Total Ethmoidectomy and Sphenoidotomy;  Surgeon: Carlie Clark, MD;  Location: [facility] OR;  Service: ENT;  Laterality: Bilateral;   NASAL SINUS SURGERY       MEDICATIONS:  Prior to Admission medications   Medication Sig Start Date End Date Taking? Authorizing Provider  albuterol  (VENTOLIN  HFA) 108 (90 Base) MCG/ACT inhaler Inhale 1-2 puffs into the lungs every 6 (six) hours as needed for wheezing or shortness of breath. 09/19/23  Yes Cook, Jayce G, DO  amLODipine  (NORVASC ) 5 MG tablet TAKE 1 TABLET BY MOUTH DAILY 04/01/24  Yes Cook, Jayce G, DO  aspirin  EC 81 MG tablet Take 1 tablet (81 mg total) by mouth daily with breakfast. 08/10/24  Yes Emokpae, Courage, MD  atorvastatin  (LIPITOR) 80 MG tablet TAKE 1 TABLET BY MOUTH DAILY 12/06/23  Yes Cook, Jayce G, DO  budeson-glycopyrrolate -formoterol  (BREZTRI  AEROSPHERE) 160-9-4.8 MCG/ACT AERO inhaler Inhale 2 puffs into the lungs 2 (two) times daily. 03/10/24  Yes Cook, Jayce G, DO  Coenzyme Q10 200 MG capsule Take 200 mg by mouth every evening.   Yes [provider]  gabapentin  (NEURONTIN ) 100 MG capsule Take 1 capsule (100 mg total) by mouth at bedtime. 05/07/23  Yes Margrette,  Stanley E, MD  ipratropium (ATROVENT ) 0.06 % nasal spray Place 2 sprays into both nostrils 4 (four) times daily as needed for rhinitis. 02/02/23  Yes Cook, Jayce G, DO  levothyroxine  (SYNTHROID ) 100 MCG tablet TAKE 1 TABLET BY MOUTH DAILY 04/01/24  Yes Cook, Jayce G, DO  meclizine  (ANTIVERT ) 25 MG tablet Take 1 tablet (25 mg total) by mouth 3 (three) times daily as needed for  dizziness. 08/01/24  Yes Mauro Elveria BROCKS, NP  olmesartan  (BENICAR ) 40 MG tablet TAKE 1 TABLET BY MOUTH DAILY 12/24/23  Yes Wert, Michael B, MD  Polyvinyl Alcohol -Povidone PF (REFRESH) 1.4-0.6 % SOLN Place 1 drop into both eyes daily as needed (dry eye).   Yes [provider]  vitamin E 400 UNIT capsule Take 400 Units by mouth every evening.    Yes [provider]  pantoprazole  (PROTONIX ) 40 MG tablet Take 1 tablet (40 mg total) by mouth 2 (two) times daily. Take 30-60 min before first meal of the day. PLEASE SCHEDULE OFFICE VISIT Patient not taking: Reported on 08/11/2024 08/10/24   Pearlean Manus, MD     ALLERGIES:  No Known Allergies   SOCIAL HISTORY:  Social History   Socioeconomic History   Marital status: Married    Spouse name: Not on file   Number of children: Not on file   Years of education: Not on file   Highest education level: Not on file  Occupational History   Not on file  Tobacco Use   Smoking status: Former    Current packs/day: 0.00    Average packs/day: 2.0 packs/day for 26.0 years (52.0 ttl pk-yrs)    Types: Cigarettes    Start date: 12/28/1957    Quit date: 11/29/1983    Years since quitting: 40.7   Smokeless tobacco: Never  Vaping Use   Vaping status: Never Used  Substance and Sexual Activity   Alcohol  use: No   Drug use: No   Sexual activity: Not on file  Other Topics Concern   Not on file  Social History Narrative   Married x 60 years. 2 children, 4 grands, 4 great grands   Social Drivers of Health   Financial Resource Strain: Low Risk  (09/14/2023)   Overall Financial Resource Strain (CARDIA)    Difficulty of Paying Living Expenses: Not hard at all  Food Insecurity: No Food Insecurity (08/10/2024)   Hunger Vital Sign    Worried About Running Out of Food in the Last Year: Never true    Ran Out of Food in the Last Year: Never true  Transportation Needs: No Transportation Needs (08/10/2024)   PRAPARE - Scientist, research (physical sciences) (Medical): No    Lack of Transportation (Non-Medical): No  Physical Activity: Inactive (09/14/2023)   Exercise Vital Sign    Days of Exercise per Week: 0 days    Minutes of Exercise per Session: 0 min  Stress: No Stress Concern Present (09/14/2023)   [employer] of Occupational Health - Occupational Stress Questionnaire    Feeling of Stress : Not at all  Social Connections: Moderately Integrated (08/10/2024)   Social Connection and Isolation Panel    Frequency of Communication with Friends and Family: More than three times a week    Frequency of Social Gatherings with Friends and Family: More than three times a week    Attends Religious Services: More than 4 times per year    Active Member of [employer] or Organizations: No    Attends Club or  Organization Meetings: Patient declined    Marital Status: Married  Catering manager Violence: Not At Risk (08/10/2024)   Humiliation, Afraid, Rape, and Kick questionnaire    Fear of Current or Ex-Partner: No    Emotionally Abused: No    Physically Abused: No    Sexually Abused: No     FAMILY HISTORY:  Family History  Problem Relation Age of Onset   Heart disease Father    Diabetes Sister    Colon cancer Neg Hx    Colon polyps Neg Hx       REVIEW OF SYSTEMS:  Review of Systems  Constitutional:  Negative for chills and fever.  Respiratory:  Negative for shortness of breath and wheezing.   Cardiovascular:  Negative for chest pain and palpitations.  Gastrointestinal:  Negative for abdominal pain, nausea and vomiting.   VITAL SIGNS:  Temp:  [97.2 F (36.2 C)-98.8 F (37.1 C)] 98.8 F (37.1 C) (09/23 0736) Pulse Rate:  [69-78] 74 (09/23 0736) Resp:  [15-21] 15 (09/23 0736) BP: (122-153)/(56-91) 137/74 (09/23 0736) SpO2:  [94 %-96 %] 95 % (09/23 0736)     Height: 5' 8 (172.7 cm) Weight: 85.3 kg BMI (Calculated): 28.59   INTAKE/OUTPUT:  09/22 0701 - 09/23 0700 In: 600 [I.V.:100; IV Piggyback:500] Out: 0   PHYSICAL  EXAM:  Physical Exam Constitutional:      Appearance: Normal appearance.  HENT:     Head: Normocephalic and atraumatic.  Eyes:     Extraocular Movements: Extraocular movements intact.     Pupils: Pupils are equal, round, and reactive to light.  Cardiovascular:     Rate and Rhythm: Normal rate and regular rhythm.  Pulmonary:     Effort: Pulmonary effort is normal.     Breath sounds: Normal breath sounds.  Abdominal:     General: There is no distension.     Palpations: Abdomen is soft.     Tenderness: There is no abdominal tenderness.  Neurological:     Mental Status: He is alert.    Labs:     Latest Ref Rng & Units 08/12/2024    2:32 AM 08/11/2024    4:04 AM 08/10/2024   12:16 AM  CBC  WBC 4.0 - 10.5 K/uL 7.2  9.2  13.4   Hemoglobin 13.0 - 17.0 g/dL 87.9  87.9  86.5   Hematocrit 39.0 - 52.0 % 35.9  36.6  40.1   Platelets 150 - 400 K/uL 184  213  253       Latest Ref Rng & Units 08/12/2024    2:32 AM 08/11/2024    4:04 AM 08/10/2024    2:14 AM  CMP  Glucose 70 - 99 mg/dL 874  78    BUN 8 - 23 mg/dL 17  21    Creatinine 9.38 - 1.24 mg/dL 8.99  9.18  9.19   Sodium 135 - 145 mmol/L 139  135    Potassium 3.5 - 5.1 mmol/L 3.8  3.9    Chloride 98 - 111 mmol/L 106  103    CO2 22 - 32 mmol/L 26  25    Calcium  8.9 - 10.3 mg/dL 8.6  8.7    Total Protein 6.5 - 8.1 g/dL 5.8  5.6    Total Bilirubin 0.0 - 1.2 mg/dL 1.9  3.5    Alkaline Phos 38 - 126 U/L 126  97    AST 15 - 41 U/L 39  54    ALT 0 - 44 U/L 66  90      Imaging studies:  EXAM: CT ABDOMEN AND PELVIS WITH CONTRAST 08/10/2024 03:33:50 AM   TECHNIQUE: CT of the abdomen and pelvis was performed with the administration of intravenous contrast. Multiplanar reformatted images are provided for review. Automated exposure control, iterative reconstruction, and/or weight-based adjustment of the mA/kV was utilized to reduce the radiation dose to as low as reasonably achievable.   COMPARISON: 10/09/2019   CLINICAL  HISTORY: Pancreatitis suspected. Pt c/o upper abd pain/ substernal chest pain that started about 7pm. Pt states the pain has been constant and hasn't changed since it started. Denies cardiac hx.   FINDINGS:   LOWER CHEST: Eventration of the right hemidiaphragm with associated right basilar atelectasis.   LIVER: Unremarkable.   GALLBLADDER AND BILE DUCTS: Layering 2.0 cm gallstone (image 30), without associated inflammatory changes.   SPLEEN: Unremarkable.   PANCREAS: Pancreatic inflammatory changes most prominent along the pancreatic tail (image 34), reflecting acute pancreatitis. No walled off fluid collection/pseudocyst. No pancreatic necrosis.   ADRENAL GLANDS: Unremarkable.   KIDNEYS, URETERS AND BLADDER: Kidneys are within normal limits. No renal, ureteral, or bladder calculi. No hydronephrosis. Bladder is within normal limits.   GI AND BOWEL: Secondary inflammatory changes involving the second/third portion of the duodenum (image 50), suggesting superimposed duodenitis. Normal appendix (image 58). Left colonic diverticulosis, without evidence of diverticulitis.   PERITONEUM AND RETROPERITONEUM: No ascites or free air.   VASCULATURE: Thoracic aortic atherosclerosis. Atherosclerotic calcifications of the abdominal aorta and branch vessels. Mild coronary artery insufficiency of the LAD and right coronary artery.   LYMPH NODES: No lymphadenopathy.   REPRODUCTIVE ORGANS: Prostate is notable for dystrophic calcifications.   BONES AND SOFT TISSUES: Mild degenerative changes of the lumbar spine.   IMPRESSION: 1. Acute uncomplicated pancreatitis. 2. Suspected secondary duodenitis. 3. Cholelithiasis, without associated inflammatory changes.   Electronically signed by: Pinkie Pebbles MD 08/10/2024 03:40 AM EDT RP Workstation: [id number]  CLINICAL DATA:  Pancreatitis, acute, severe.   EXAM: MRI ABDOMEN WITHOUT CONTRAST  (INCLUDING MRCP)    TECHNIQUE: Multiplanar multisequence MR imaging of the abdomen was performed. Heavily T2-weighted images of the biliary and pancreatic ducts were obtained, and three-dimensional MRCP images were rendered by post processing.   COMPARISON:  Ultrasound abdomen and CT scan abdomen and pelvis from earlier the same day.   FINDINGS: Lower chest: Note is made of elevated right hemidiaphragm with associated compressive atelectatic changes in the right lung base. Otherwise, unremarkable MR appearance to the lung bases. No pleural effusion. No pericardial effusion. Normal heart size.   Hepatobiliary: The liver is normal in size. Noncirrhotic configuration. No focal liver lesion. No intra or extrahepatic bile duct dilation. Extrahepatic bile duct measures up to 6 mm, within normal limits for patient's age. However, there are at least 3, 4-5 mm sized calculi in the distal common bile duct. The gallbladder is physiologically distended. There is a single 1.8 x 2.3 cm gallstone. No abnormal wall thickening or pericholecystic fat stranding.   Pancreas: There is subtle heterogeneity of the pancreas which exhibit mild surrounding fat stranding, compatible with acute pancreatitis. No peripancreatic collection. No focal pancreatic lesion. Main pancreatic duct is not dilated.   Spleen:  Within normal limits in size and appearance. No focal mass.   Adrenals/Urinary Tract: Unremarkable adrenal glands. No hydroureteronephrosis. No suspicious renal mass.   Stomach/Bowel: Visualized portions within the abdomen are unremarkable. No disproportionate dilation of bowel loops. Unremarkable appendix.   Vascular/Lymphatic: No pathologically enlarged lymph nodes identified. No abdominal aortic aneurysm  demonstrated. No ascites.   Other:  None.   Musculoskeletal: No suspicious bone lesions identified.   IMPRESSION: 1. There is subtle heterogeneity of the pancreas which exhibit mild surrounding fat  stranding, compatible with acute interstitial pancreatitis. No peripancreatic collection. No focal pancreatic lesion. Main pancreatic duct is not dilated. 2. There is a single 1.8 x 2.3 cm gallstone without imaging signs of acute cholecystitis. 3. There are at least 3, 4-5 mm sized calculi in the distal common bile duct. However, there is no intra or extrahepatic bile duct dilation. 4. Elevated right hemidiaphragm with associated compressive atelectatic changes in the right lung base.     Electronically Signed   By: Ree Molt M.D.   On: 08/10/2024 09:25     Assessment/Plan: 83 y.o. male with history of choledocholithiasis, complicated by pertinent comorbidities including history of acute gallstone pancreatitis, essential hypertension, mixed hyperlipidemia, type 2 diabetes and GERD.   - S/p day 1 ERCP, denies any abdominal pain on physical exam. Appears comfortable. LFTs and total bilirubin improving. No obvious complications from ERCP such as pancreatitis or cholangitis.  - Discussed robotic assisted laparoscopic cholecystectomy. Recommended surgery to prevent reoccurrence or other complication such as acute cholecystitis, gallstone pancreatitis, cholangitis. Patient would like to proceed with surgery.  - Will plan for surgery tomorrow afternoon.  - Continue full liquid diet today, NPO at midnight  - DVT prophylaxis  Thank you for the opportunity to participate in this patient's care.   -- Gilmer Cea PA-C

## 2024-08-13 ENCOUNTER — Other Ambulatory Visit: Payer: Self-pay

## 2024-08-13 ENCOUNTER — Inpatient Hospital Stay: Admitting: Certified Registered"

## 2024-08-13 ENCOUNTER — Encounter
Admission: AD | Disposition: A | Discharge status: Home / Self Care | Payer: Self-pay | Source: Other Acute Inpatient Hospital | Attending: Student

## 2024-08-13 DIAGNOSIS — K851 Biliary acute pancreatitis without necrosis or infection: Secondary | ICD-10-CM | POA: Diagnosis not present

## 2024-08-13 LAB — GLUCOSE, CAPILLARY
Glucose-Capillary: 110 mg/dL — ABNORMAL HIGH (ref 70–99)
Glucose-Capillary: 99 mg/dL (ref 70–99)
Glucose-Capillary: 140 mg/dL — ABNORMAL HIGH (ref 70–99)
Glucose-Capillary: 250 mg/dL — ABNORMAL HIGH (ref 70–99)
Glucose-Capillary: 270 mg/dL — ABNORMAL HIGH (ref 70–99)

## 2024-08-13 SURGERY — CHOLECYSTECTOMY, ROBOT-ASSISTED, LAPAROSCOPIC
Anesthesia: General

## 2024-08-13 MED ORDER — BUPIVACAINE-EPINEPHRINE 0.25% -1:200000 IJ SOLN
INTRAMUSCULAR | Status: DC | PRN
  Administered 2024-08-13: 30 mL

## 2024-08-13 MED ORDER — ONDANSETRON HCL 4 MG/2ML IJ SOLN
INTRAMUSCULAR | Status: DC | PRN
  Administered 2024-08-13: 4 mg via INTRAVENOUS

## 2024-08-13 MED ORDER — DEXAMETHASONE SODIUM PHOSPHATE 10 MG/ML IJ SOLN
INTRAMUSCULAR | Status: DC | PRN
Start: 2024-08-13 — End: 2024-08-13
  Administered 2024-08-13: 5 mg via INTRAVENOUS

## 2024-08-13 MED ORDER — LACTATED RINGERS IV SOLN: INTRAVENOUS | Status: DC | PRN

## 2024-08-13 MED ORDER — SODIUM CHLORIDE 0.9 % IV SOLN: INTRAVENOUS | Status: DC

## 2024-08-13 MED ORDER — OXYCODONE HCL 5 MG PO TABS
5.0000 mg | ORAL_TABLET | Freq: Once | ORAL | Status: AC | PRN
  Administered 2024-08-13: 5 mg via ORAL

## 2024-08-13 MED ORDER — KETOROLAC TROMETHAMINE 30 MG/ML IJ SOLN
INTRAMUSCULAR | Status: DC | PRN
  Administered 2024-08-13: 15 mg via INTRAVENOUS

## 2024-08-13 MED ORDER — ROCURONIUM BROMIDE 10 MG/ML (PF) SYRINGE
PREFILLED_SYRINGE | INTRAVENOUS | Status: AC
  Filled 2024-08-13: qty 10

## 2024-08-13 MED ORDER — DEXAMETHASONE SODIUM PHOSPHATE 10 MG/ML IJ SOLN
INTRAMUSCULAR | Status: AC
  Filled 2024-08-13: qty 1

## 2024-08-13 MED ORDER — KETOROLAC TROMETHAMINE 30 MG/ML IJ SOLN
INTRAMUSCULAR | Status: AC
Start: 2024-08-13 — End: 2024-08-13
  Filled 2024-08-13: qty 1

## 2024-08-13 MED ORDER — INDOCYANINE GREEN 25 MG IV SOLR
1.2500 mg | Freq: Once | INTRAVENOUS | Status: AC
  Administered 2024-08-13: 1.25 mg via INTRAVENOUS

## 2024-08-13 MED ORDER — FENTANYL CITRATE (PF) 100 MCG/2ML IJ SOLN
INTRAMUSCULAR | Status: AC
  Filled 2024-08-13: qty 2

## 2024-08-13 MED ORDER — LIDOCAINE HCL (PF) 2 % IJ SOLN
INTRAMUSCULAR | Status: AC
  Filled 2024-08-13: qty 5

## 2024-08-13 MED ORDER — ACETAMINOPHEN 10 MG/ML IV SOLN
INTRAVENOUS | Status: DC | PRN
Start: 2024-08-13 — End: 2024-08-13
  Administered 2024-08-13: 1000 mg via INTRAVENOUS

## 2024-08-13 MED ORDER — BUPIVACAINE-EPINEPHRINE (PF) 0.25% -1:200000 IJ SOLN
INTRAMUSCULAR | Status: AC
  Filled 2024-08-13: qty 30

## 2024-08-13 MED ORDER — ONDANSETRON HCL 4 MG/2ML IJ SOLN
INTRAMUSCULAR | Status: AC
  Filled 2024-08-13: qty 2

## 2024-08-13 MED ORDER — FENTANYL CITRATE (PF) 100 MCG/2ML IJ SOLN
25.0000 ug | INTRAMUSCULAR | Status: DC | PRN
  Administered 2024-08-13 (×4): 25 ug via INTRAVENOUS

## 2024-08-13 MED ORDER — LIDOCAINE HCL (CARDIAC) PF 100 MG/5ML IV SOSY
PREFILLED_SYRINGE | INTRAVENOUS | Status: DC | PRN
Start: 2024-08-13 — End: 2024-08-13
  Administered 2024-08-13: 100 mg via INTRAVENOUS

## 2024-08-13 MED ORDER — FENTANYL CITRATE (PF) 100 MCG/2ML IJ SOLN
INTRAMUSCULAR | Status: DC | PRN
  Administered 2024-08-13 (×2): 50 ug via INTRAVENOUS

## 2024-08-13 MED ORDER — PROPOFOL 10 MG/ML IV BOLUS
INTRAVENOUS | Status: AC
  Filled 2024-08-13: qty 20

## 2024-08-13 MED ORDER — PROPOFOL 10 MG/ML IV BOLUS
INTRAVENOUS | Status: DC | PRN
  Administered 2024-08-13: 20 mg via INTRAVENOUS
  Administered 2024-08-13: 130 mg via INTRAVENOUS

## 2024-08-13 MED ORDER — OXYCODONE HCL 5 MG PO TABS
ORAL_TABLET | ORAL | Status: AC
  Filled 2024-08-13: qty 1

## 2024-08-13 MED ORDER — OXYCODONE HCL 5 MG/5ML PO SOLN: 5.0000 mg | Freq: Once | ORAL | Status: AC | PRN

## 2024-08-13 MED ORDER — SUGAMMADEX SODIUM 200 MG/2ML IV SOLN
INTRAVENOUS | Status: DC | PRN
  Administered 2024-08-13: 300 mg via INTRAVENOUS

## 2024-08-13 MED ORDER — EPHEDRINE SULFATE-NACL 50-0.9 MG/10ML-% IV SOSY
PREFILLED_SYRINGE | INTRAVENOUS | Status: DC | PRN
  Administered 2024-08-13: 5 mg via INTRAVENOUS

## 2024-08-13 MED ORDER — 0.9 % SODIUM CHLORIDE (POUR BTL) OPTIME
TOPICAL | Status: DC | PRN
  Administered 2024-08-13: 500 mL

## 2024-08-13 MED ORDER — CEFAZOLIN SODIUM-DEXTROSE 2-4 GM/100ML-% IV SOLN
INTRAVENOUS | Status: AC
  Filled 2024-08-13: qty 100

## 2024-08-13 MED ORDER — PHENYLEPHRINE 80 MCG/ML (10ML) SYRINGE FOR IV PUSH (FOR BLOOD PRESSURE SUPPORT)
PREFILLED_SYRINGE | INTRAVENOUS | Status: AC
  Filled 2024-08-13: qty 10

## 2024-08-13 MED ORDER — HYDROCODONE-ACETAMINOPHEN 5-325 MG PO TABS
1.0000 | ORAL_TABLET | ORAL | Status: DC | PRN
  Administered 2024-08-13 – 2024-08-14 (×2): 1 via ORAL
  Filled 2024-08-13 (×2): qty 1

## 2024-08-13 MED ORDER — ROCURONIUM BROMIDE 100 MG/10ML IV SOLN
INTRAVENOUS | Status: DC | PRN
  Administered 2024-08-13: 10 mg via INTRAVENOUS
  Administered 2024-08-13: 50 mg via INTRAVENOUS

## 2024-08-13 MED ORDER — CEFAZOLIN SODIUM-DEXTROSE 2-4 GM/100ML-% IV SOLN
2.0000 g | Freq: Once | INTRAVENOUS | Status: AC
  Administered 2024-08-13: 2 g via INTRAVENOUS

## 2024-08-13 MED ORDER — ACETAMINOPHEN 10 MG/ML IV SOLN
INTRAVENOUS | Status: AC
  Filled 2024-08-13: qty 100

## 2024-08-13 SURGICAL SUPPLY — 42 items
BAG PRESSURE INF REUSE 1000 (BAG) IMPLANT
CANNULA REDUCER 12-8 DVNC XI (CANNULA) ×1 IMPLANT
CAUTERY HOOK MNPLR 1.6 DVNC XI (INSTRUMENTS) ×1 IMPLANT
CLIP LIGATING HEM O LOK PURPLE (MISCELLANEOUS) IMPLANT
CLIP LIGATING HEMO O LOK GREEN (MISCELLANEOUS) ×1 IMPLANT
DEFOGGER SCOPE WARM SEASHARP (MISCELLANEOUS) ×1 IMPLANT
DERMABOND ADVANCED .7 DNX12 (GAUZE/BANDAGES/DRESSINGS) ×1 IMPLANT
DRAPE ARM DVNC X/XI (DISPOSABLE) ×4 IMPLANT
DRAPE C-ARM XRAY 36X54 (DRAPES) IMPLANT
DRAPE COLUMN DVNC XI (DISPOSABLE) ×1 IMPLANT
ELECTRODE REM PT RTRN 9FT ADLT (ELECTROSURGICAL) ×1 IMPLANT
FORCEPS BPLR 8 MD DVNC XI (FORCEP) IMPLANT
FORCEPS BPLR FENES DVNC XI (FORCEP) ×1 IMPLANT
FORCEPS PROGRASP DVNC XI (FORCEP) ×1 IMPLANT
GLOVE BIO SURGEON STRL SZ 6.5 (GLOVE) ×2 IMPLANT
GLOVE BIOGEL PI IND STRL 6.5 (GLOVE) ×2 IMPLANT
GLOVE SURG SYN 6.5 PF PI (GLOVE) ×2 IMPLANT
GOWN STRL REUS W/ TWL LRG LVL3 (GOWN DISPOSABLE) ×4 IMPLANT
GRASPER SUT TROCAR 14GX15 (MISCELLANEOUS) ×1 IMPLANT
IRRIGATOR SUCT 8 DISP DVNC XI (IRRIGATION / IRRIGATOR) IMPLANT
IV CATH ANGIO 12GX3 LT BLUE (NEEDLE) IMPLANT
IV NS 1000ML BAXH (IV SOLUTION) IMPLANT
KIT PINK PAD W/HEAD ARM REST (MISCELLANEOUS) ×1 IMPLANT
LABEL OR SOLS (LABEL) ×1 IMPLANT
MANIFOLD NEPTUNE II (INSTRUMENTS) IMPLANT
NDL HYPO 22X1.5 SAFETY MO (MISCELLANEOUS) ×1 IMPLANT
NDL INSUFFLATION 14GA 120MM (NEEDLE) ×1 IMPLANT
NEEDLE HYPO 22X1.5 SAFETY MO (MISCELLANEOUS) ×1 IMPLANT
NEEDLE INSUFFLATION 14GA 120MM (NEEDLE) ×1 IMPLANT
NS IRRIG 500ML POUR BTL (IV SOLUTION) ×1 IMPLANT
OBTURATOR OPTICALSTD 8 DVNC (TROCAR) ×1 IMPLANT
PACK LAP CHOLECYSTECTOMY (MISCELLANEOUS) ×1 IMPLANT
SEAL UNIV 5-12 XI (MISCELLANEOUS) ×4 IMPLANT
SET TUBE SMOKE EVAC HIGH FLOW (TUBING) ×1 IMPLANT
SOLUTION ELECTROSURG ANTI STCK (MISCELLANEOUS) ×1 IMPLANT
SPIKE FLUID TRANSFER (MISCELLANEOUS) ×2 IMPLANT
SPONGE T-LAP 4X18 ~~LOC~~+RFID (SPONGE) IMPLANT
SUT VICRYL 0 UR6 27IN ABS (SUTURE) ×1 IMPLANT
SUTURE MNCRL 4-0 27XMF (SUTURE) ×1 IMPLANT
SYSTEM BAG RETRIEVAL 10MM (BASKET) ×1 IMPLANT
TRAP FLUID SMOKE EVACUATOR (MISCELLANEOUS) IMPLANT
WATER STERILE IRR 500ML POUR (IV SOLUTION) ×1 IMPLANT

## 2024-08-13 NOTE — Anesthesia Procedure Notes (Signed)
 Procedure Name: Intubation Date/Time: 08/13/2024 11:25 AM  Performed by: Landy Francena BIRCH, CRNAPre-anesthesia Checklist: Patient identified, Emergency Drugs available, Suction available and Patient being monitored Patient Re-evaluated:Patient Re-evaluated prior to induction Oxygen Delivery Method: Circle system utilized Preoxygenation: Pre-oxygenation with 100% oxygen Induction Type: IV induction Ventilation: Mask ventilation without difficulty and Oral airway inserted - appropriate to patient size Laryngoscope Size: McGrath and 3 Grade View: Grade I Tube type: Oral Tube size: 7.5 mm Number of attempts: 1 Airway Equipment and Method: Stylet, Oral airway, Bite block and Patient positioned with wedge pillow Placement Confirmation: ETT inserted through vocal cords under direct vision, positive ETCO2 and breath sounds checked- equal and bilateral Secured at: 21 cm Tube secured with: Tape Dental Injury: Teeth and Oropharynx as per pre-operative assessment

## 2024-08-13 NOTE — Anesthesia Preprocedure Evaluation (Signed)
 Anesthesia Evaluation  Patient identified by MRN, date of birth, ID band Patient awake    Reviewed: Allergy & Precautions, NPO status , Patient's Chart, lab work & pertinent test results  History of Anesthesia Complications Negative for: history of anesthetic complications  Airway Mallampati: III  TM Distance: <3 FB Neck ROM: full    Dental  (+) Upper Dentures, Lower Dentures   Pulmonary neg pulmonary ROS, neg shortness of breath, former smoker   Pulmonary exam normal        Cardiovascular Exercise Tolerance: Good hypertension, (-) angina (-) Past MI Normal cardiovascular exam     Neuro/Psych negative neurological ROS     GI/Hepatic Neg liver ROS,GERD  Controlled,,  Endo/Other  Hypothyroidism    Renal/GU      Musculoskeletal   Abdominal   Peds  Hematology negative hematology ROS (+)   Anesthesia Other Findings Past Medical History: No date: Acute inflammation of nasal sinus     Comment:  Chronic No date: Anxiety No date: Diverticulitis No date: Hypercholesteremia No date: Hypertension No date: Hypothyroidism No date: Nasal turbinate hypertrophy No date: Reactive airways dysfunction syndrome (HCC)  Past Surgical History: No date: BACK SURGERY     Comment:  sciatic 10/31/2011: COLONOSCOPY     Comment:  Adequate prep, extensive left-sided diverticula, colon               otherwise normal. 04/19/2022: COLONOSCOPY WITH PROPOFOL ; N/A     Comment:  Procedure: COLONOSCOPY WITH PROPOFOL ;  Surgeon: Shaaron Lamar HERO, MD;  Location: [facility];  Service:               Endoscopy;  Laterality: N/A;  11:15am 08/11/2024: ERCP; N/A     Comment:  Procedure: ERCP, WITH INTERVENTION IF INDICATED;                Surgeon: Jinny Carmine, MD;  Location: [facility] ENDOSCOPY;                Service: Endoscopy;  Laterality: N/A; 08/22/2019: NASAL SEPTOPLASTY W/ TURBINOPLASTY; Bilateral     Comment:  Procedure: Sinus  Endoscopy   Nasal Septoplasty with               Bilateral TURBINATE REDUCTION, Bilateral Total               Ethmoidectomy and Sphenoidotomy;  Surgeon: Carlie Clark,              MD;  Location: [facility] OR;  Service: ENT;  Laterality:               Bilateral; No date: NASAL SINUS SURGERY  BMI    Body Mass Index: 28.59 kg/m      Reproductive/Obstetrics negative OB ROS                              Anesthesia Physical Anesthesia Plan  ASA: 3  Anesthesia Plan: General ETT   Post-op Pain Management:    Induction: Intravenous  PONV Risk Score and Plan: Ondansetron , Dexamethasone , Midazolam  and Treatment may vary due to age or medical condition  Airway Management Planned: Oral ETT  Additional Equipment:   Intra-op Plan:   Post-operative Plan: Extubation in OR  Informed Consent: I have reviewed the patients History and Physical, chart, labs and discussed the procedure including the risks, benefits and alternatives for the proposed anesthesia  with the patient or authorized representative who has indicated his/her understanding and acceptance.     Dental Advisory Given  Plan Discussed with: Anesthesiologist, CRNA and Surgeon  Anesthesia Plan Comments: (Patient consented for risks of anesthesia including but not limited to:  - adverse reactions to medications - damage to eyes, teeth, lips or other oral mucosa - nerve damage due to positioning  - sore throat or hoarseness - Damage to heart, brain, nerves, lungs, other parts of body or loss of life  Patient voiced understanding and assent.)        Anesthesia Quick Evaluation

## 2024-08-13 NOTE — Progress Notes (Signed)
  PROGRESS NOTE    Cameron Smith  FMW:[id number] DOB: December 28, 1940 DOA: 08/10/2024 PCP: Cook, Jayce G, DO  136A/136A-AA  LOS: 3 days   Brief hospital course:   Assessment & Plan: Cameron Smith is a 83 y.o. male with medical history significant of HTN, HLD, hypothyroidism, IIDM, presented with acute abdominal pain.    Acute gallstone pancreatis Lipase >2800 on presentation, decreased to 299 the next day.  Cholelithiases and choledocholithiasis  Status post ERCP 9/22 --started on ceftriaxone  and flagyl  --cholecystectomy today  Hypertension --cont amlodipine  and irbesartan    HLD --resume statin after discharge   Hypothyroidism Continue home levothyroxine   DM2 --ACHS and SSI for now   DVT prophylaxis: Lovenox  SQ Code Status: Full code  Family Communication: family updated at bedside today Level of care: Telemetry Medical Dispo:   The patient is from: home Anticipated d/c is to: home Anticipated d/c date is: tomorrow   Subjective and Interval History:  Pt underwent cholecystectomy.  Tolerated well.     Objective: Vitals:   08/13/24 1310 08/13/24 1315 08/13/24 1338 08/13/24 1500  BP:  (!) 173/59 (!) 180/62 (!) 157/78  Pulse: 71 64 63 82  Resp: 15 11  12   Temp:  (!) 97.2 F (36.2 C) 97.9 F (36.6 C) 98 F (36.7 C)  TempSrc:    Oral  SpO2: 95% 94% 94% 94%  Weight:      Height:        Intake/Output Summary (Last 24 hours) at 08/13/2024 1906 Last data filed at 08/13/2024 1324 Gross per 24 hour  Intake 1101.62 ml  Output 10 ml  Net 1091.62 ml   Filed Weights   08/10/24 1715 08/13/24 1014  Weight: 85.3 kg 83.9 kg    Examination:   Constitutional: NAD, AAOx3 HEENT: conjunctivae and lids normal, EOMI CV: No cyanosis.   RESP: normal respiratory effort, on RA Neuro: II - XII grossly intact.     Data Reviewed: I have personally reviewed labs and imaging studies  Time spent: 50 minutes  Ellouise Haber, MD [facility] If 7PM-7AM, please contact  night-coverage 08/13/2024, 7:06 PM

## 2024-08-13 NOTE — Transfer of Care (Signed)
 Immediate Anesthesia Transfer of Care Note  Patient: Cameron Smith  Procedure(s) Performed: CHOLECYSTECTOMY, ROBOT-ASSISTED, LAPAROSCOPIC  Patient Location: PACU  Anesthesia Type:General  Level of Consciousness: awake, alert , and oriented  Airway & Oxygen Therapy: Patient Spontanous Breathing and Patient connected to face mask oxygen  Post-op Assessment: Report given to RN and Post -op Vital signs reviewed and stable  Post vital signs: Reviewed and stable  Last Vitals:  Vitals Value Taken Time  BP 174/56 08/13/24 12:36  Temp 36.4 C 08/13/24 12:35  Pulse 60 08/13/24 12:39  Resp 17 08/13/24 12:40  SpO2 94 % 08/13/24 12:39  Vitals shown include unfiled device data.  Last Pain:  Vitals:   08/13/24 1235  TempSrc:   PainSc: Asleep      Patients Stated Pain Goal: 0 (08/12/24 0929)  Complications: No notable events documented.

## 2024-08-13 NOTE — Progress Notes (Signed)
 [facility]- General Surgery  SURGICAL PROGRESS NOTE  Hospital Day(s): 3.   Interval History:  Patient is doing great s/p day 2 of ERCP. Tolerated full liquid diet. Denies any nausea or vomiting. Denies any abdominal discomfort.  Has been NPO past midnight. Scheduled to have surgery later this morning.  Vital signs in last 24 hours: [min-max] current  Temp:  [98.3 F (36.8 C)-99 F (37.2 C)] 98.5 F (36.9 C) (09/24 0826) Pulse Rate:  [67-73] 73 (09/24 0826) Resp:  [15-17] 17 (09/24 0826) BP: (142-158)/(54-76) 158/62 (09/24 0826) SpO2:  [95 %-96 %] 96 % (09/24 0826)     Height: 5' 8 (172.7 cm) Weight: 85.3 kg BMI (Calculated): 28.59   Intake/Output last 2 shifts:  09/23 0701 - 09/24 0700 In: 2221.6 [P.O.:1920; IV Piggyback:301.6] Out: -    Physical Exam:  Constitutional: alert, cooperative and no distress  Respiratory: breathing non-labored at rest  Cardiovascular: regular rate and sinus rhythm  Gastrointestinal: soft, non-tender, and non-distended  Labs:     Latest Ref Rng & Units 08/12/2024    2:32 AM 08/11/2024    4:04 AM 08/10/2024   12:16 AM  CBC  WBC 4.0 - 10.5 K/uL 7.2  9.2  13.4   Hemoglobin 13.0 - 17.0 g/dL 87.9  87.9  86.5   Hematocrit 39.0 - 52.0 % 35.9  36.6  40.1   Platelets 150 - 400 K/uL 184  213  253       Latest Ref Rng & Units 08/12/2024    2:32 AM 08/11/2024    4:04 AM 08/10/2024    2:14 AM  CMP  Glucose 70 - 99 mg/dL 874  78    BUN 8 - 23 mg/dL 17  21    Creatinine 9.38 - 1.24 mg/dL 8.99  9.18  9.19   Sodium 135 - 145 mmol/L 139  135    Potassium 3.5 - 5.1 mmol/L 3.8  3.9    Chloride 98 - 111 mmol/L 106  103    CO2 22 - 32 mmol/L 26  25    Calcium  8.9 - 10.3 mg/dL 8.6  8.7    Total Protein 6.5 - 8.1 g/dL 5.8  5.6    Total Bilirubin 0.0 - 1.2 mg/dL 1.9  3.5    Alkaline Phos 38 - 126 U/L 126  97    AST 15 - 41 U/L 39  54    ALT 0 - 44 U/L 66  90      Imaging studies: No new pertinent imaging studies   Assessment/Plan:  83 y.o. male  with choledocholithiasis and pancreatitis, complicated by pertinent comorbidities including history of acute gallstone pancreatitis, essential hypertension, mixed hyperlipidemia, type 2 diabetes and GERD.    - S/p day 2 ERCP, denies any abdominal discomfort. No signs of complication from ERCP. Will proceed with robotic assisted laparoscopic cholecystectomy later this morning. Patient does not have any further questions regarding surgery.  - Continue NPO  - Continue DVT prophylaxis  -- Arin Peral Barrientos PA-C

## 2024-08-13 NOTE — Plan of Care (Signed)

## 2024-08-13 NOTE — Op Note (Signed)
 Preoperative diagnosis: History of choledocholithiasis   Postoperative diagnosis: Same  Procedure: Robotic Assisted Laparoscopic Cholecystectomy.   Anesthesia: GETA   Surgeon: Dr. Cesar Coe  Wound Classification: Clean Contaminated  Indications: Patient is a 83 y.o. male developed right upper quadrant pain and on workup was found to have cholelithiasis with choledocholithiasis and biliary pancreatitis. He underwent successful ERCP. Robotic Assisted Laparoscopic cholecystectomy was elected.  Findings:  Critical view of safety achieved Cystic duct and artery identified, ligated and divided Adequate hemostasis  Description of procedure: The patient was placed on the operating table in the supine position. General anesthesia was induced. A time-out was completed verifying correct patient, procedure, site, positioning, and implant(s) and/or special equipment prior to beginning this procedure. An orogastric tube was placed. The abdomen was prepped and draped in the usual sterile fashion.  An incision was made in a natural skin line below the umbilicus.  The fascia was elevated and the Veress needle inserted. Proper position was confirmed by aspiration and saline meniscus test.  The abdomen was insufflated with carbon dioxide to a pressure of 15 mmHg. The patient tolerated insufflation well. A 8-mm trocar was then inserted in optiview fashion.  The laparoscope was inserted and the abdomen inspected. No injuries from initial trocar placement were noted. Additional trocars were then inserted in the following locations: an 8-mm trocar in the left lateral abdomen, and another two 8-mm trocars to the right side of the abdomen 5 cm appart. The umbilical trocar was changed to a 12 mm trocar all under direct visualization. The abdomen was inspected and no abnormalities were found. The table was placed in the reverse Trendelenburg position with the right side up. The robotic arms were docked and target  anatomy identified. Instrument inserted under direct visualization.  Filmy adhesions between the gallbladder and omentum, duodenum and transverse colon were lysed with electrocautery. The dome of the gallbladder was grasped with a prograsp and retracted over the dome of the liver. The infundibulum was also grasped with an atraumatic grasper and retracted toward the right lower quadrant. This maneuver exposed Calot's triangle. The peritoneum overlying the gallbladder infundibulum was then incised and the cystic duct and cystic artery identified and circumferentially dissected. Critical view of safety reviewed before ligating any structure. Firefly images taken to visualize biliary ducts. The cystic duct and cystic artery were then doubly clipped and divided close to the gallbladder.  The gallbladder was then dissected from its peritoneal attachments by electrocautery. Hemostasis was checked and the gallbladder and contained stones were removed using an endoscopic retrieval bag. The gallbladder was passed off the table as a specimen. There was no evidence of bleeding from the gallbladder fossa or cystic artery or leakage of the bile from the cystic duct stump. Secondary trocars were removed under direct vision. No bleeding was noted. The robotic arms were undoked. The scope was withdrawn and the umbilical trocar removed. The abdomen was allowed to collapse. The fascia of the 12mm trocar sites was closed with figure-of-eight 0 vicryl sutures. The skin was closed with subcuticular sutures of 4-0 monocryl and topical skin adhesive. The orogastric tube was removed.  The patient tolerated the procedure well and was taken to the postanesthesia care unit in stable condition.   Specimen: Gallbladder  Complications: None  EBL: 10 mL

## 2024-08-14 LAB — GLUCOSE, CAPILLARY: Glucose-Capillary: 174 mg/dL — ABNORMAL HIGH (ref 70–99)

## 2024-08-14 MED ORDER — HYDROCODONE-ACETAMINOPHEN 5-325 MG PO TABS: 1.0000 | ORAL_TABLET | Freq: Four times a day (QID) | ORAL | 0 refills | Status: DC | PRN

## 2024-08-14 NOTE — Plan of Care (Signed)

## 2024-08-14 NOTE — Anesthesia Postprocedure Evaluation (Signed)
 Anesthesia Post Note  Patient: Cameron Smith  Procedure(s) Performed: CHOLECYSTECTOMY, ROBOT-ASSISTED, LAPAROSCOPIC  Patient location during evaluation: PACU Anesthesia Type: General Level of consciousness: awake and alert Pain management: pain level controlled Vital Signs Assessment: post-procedure vital signs reviewed and stable Respiratory status: spontaneous breathing, nonlabored ventilation and respiratory function stable Cardiovascular status: blood pressure returned to baseline and stable Postop Assessment: no apparent nausea or vomiting Anesthetic complications: no   No notable events documented.   Last Vitals:  Vitals:   08/13/24 2014 08/14/24 0600  BP: (!) 147/66 (!) 153/69  Pulse: 75 64  Resp: 16 17  Temp: 36.9 C 36.8 C  SpO2: 94% 98%    Last Pain:  Vitals:   08/14/24 0600  TempSrc: Oral  PainSc:                  Fairy POUR Jovaun Levene

## 2024-08-14 NOTE — Progress Notes (Signed)
 [facility]- General Surgery  SURGICAL PROGRESS NOTE  Hospital Day(s): 4.   Post op day(s): 1 Day Post-Op.   Interval History:  Patient was sitting comfortably on chair eating breakfast this morning. Denies any abdominal discomfort. Denies any nausea or vomiting. Has been tolerating diet. Has not had a bowel movement or passed gas. Does not have any complaints or concerns. States he is ready to go home.  Vital signs in last 24 hours: [min-max] current  Temp:  [97.2 F (36.2 C)-98.6 F (37 C)] 98.6 F (37 C) (09/25 0754) Pulse Rate:  [59-82] 63 (09/25 0754) Resp:  [10-18] 16 (09/25 0754) BP: (135-180)/(56-78) 146/67 (09/25 0754) SpO2:  [94 %-98 %] 95 % (09/25 0754) Weight:  [83.9 kg] 83.9 kg (09/24 1014)     Height: 5' 8 (172.7 cm) Weight: 83.9 kg BMI (Calculated): 28.14   Intake/Output last 2 shifts:  09/24 0701 - 09/25 0700 In: 1203.4 [I.V.:800; IV Piggyback:403.4] Out: 10 [Blood:10]   Physical Exam:  Constitutional: alert, cooperative and no distress  Respiratory: breathing non-labored at rest  Cardiovascular: regular rate and sinus rhythm  Gastrointestinal: soft, non-tender, and non-distended, surgical incisions are clean and dry, with surgical glue intact.  Labs:     Latest Ref Rng & Units 08/12/2024    2:32 AM 08/11/2024    4:04 AM 08/10/2024   12:16 AM  CBC  WBC 4.0 - 10.5 K/uL 7.2  9.2  13.4   Hemoglobin 13.0 - 17.0 g/dL 87.9  87.9  86.5   Hematocrit 39.0 - 52.0 % 35.9  36.6  40.1   Platelets 150 - 400 K/uL 184  213  253       Latest Ref Rng & Units 08/12/2024    2:32 AM 08/11/2024    4:04 AM 08/10/2024    2:14 AM  CMP  Glucose 70 - 99 mg/dL 874  78    BUN 8 - 23 mg/dL 17  21    Creatinine 9.38 - 1.24 mg/dL 8.99  9.18  9.19   Sodium 135 - 145 mmol/L 139  135    Potassium 3.5 - 5.1 mmol/L 3.8  3.9    Chloride 98 - 111 mmol/L 106  103    CO2 22 - 32 mmol/L 26  25    Calcium  8.9 - 10.3 mg/dL 8.6  8.7    Total Protein 6.5 - 8.1 g/dL 5.8  5.6    Total  Bilirubin 0.0 - 1.2 mg/dL 1.9  3.5    Alkaline Phos 38 - 126 U/L 126  97    AST 15 - 41 U/L 39  54    ALT 0 - 44 U/L 66  90      Imaging studies: No new pertinent imaging studies   Assessment/Plan:  83 y.o. male with history of choledocholithiasis and pancreatitis 1 Day Post-Op s/p robotic assisted laparoscopic cholecystectomy, complicated by pertinent comorbidities including history of acute gallstone pancreatitis, essential hypertension, mixed hyperlipidemia, type 2 diabetes and GERD.   Patient is recovering well s/p Day 1, vital signs are stable, no fever and not tachycardic. Denies any abdominal discomfort, nausea or vomiting. Surgical incisions are clean and dry. No concern for infection.  Patient is cleared from surgical standpoint.  No concerns for discharge. Will follow-up outpatient in 2 weeks.  -- Gilmer Cea PA-C

## 2024-08-14 NOTE — Discharge Summary (Signed)
 Physician Discharge Summary   Cameron Smith  male DOB: Oct 28, 1941  FMW:[id number]  PCP: Cook, Jayce G, DO  Admit date: 08/10/2024 Discharge date: 08/14/2024  Admitted From: home Disposition:  home CODE STATUS: Full code  Discharge Instructions     No wound care   Complete by: As directed       Hospital Course:  For full details, please see H&P, progress notes, consult notes and ancillary notes.  Briefly,  Cameron Smith is a 83 y.o. male with medical history significant of HTN, hypothyroidism, IIDM, presented with acute abdominal pain.    Acute gallstone pancreatis Lipase >2800 on presentation, decreased to 299 the next day.   Cholelithiases and choledocholithiasis  Status post ERCP on 9/22 S/p cholecystectomy on 08/13/24 --received 4 days of empiric ceftriaxone  and flagyl  --outpatient f/u with GenSurg in 2 weeks  Hypertension --cont amlodipine  and olmesartan    HLD --resume statin after discharge   Hypothyroidism Continue home levothyroxine    DM2 --last A1c 6.7.  Not on hypoglycemics PTA.   Unless noted above, medications under STOP list are ones pt was not taking PTA.  Discharge Diagnoses:  Principal Problem:   Pancreatitis due to biliary obstruction Active Problems:   Acute gallstone pancreatitis   Calculus of common duct   30 Day Unplanned Readmission Risk Score    Flowsheet Row Admission (Current) from 08/10/2024 in [facility] (1A)  30 Day Unplanned Readmission Risk Score (%) 10.75 Filed at 08/14/2024 0801    This score is the patient's risk of an unplanned readmission within 30 days of being discharged (0 -100%). The score is based on dignosis, age, lab data, medications, orders, and past utilization.   Low:  0-14.9   Medium: 15-21.9   High: 22-29.9   Extreme: 30 and above         Discharge Instructions:  Allergies as of 08/14/2024   No Known Allergies      Medication List     STOP taking these  medications    pantoprazole  40 MG tablet Commonly known as: PROTONIX        TAKE these medications    albuterol  108 (90 Base) MCG/ACT inhaler Commonly known as: VENTOLIN  HFA Inhale 1-2 puffs into the lungs every 6 (six) hours as needed for wheezing or shortness of breath.   amLODipine  5 MG tablet Commonly known as: NORVASC  TAKE 1 TABLET BY MOUTH DAILY   aspirin  EC 81 MG tablet Take 1 tablet (81 mg total) by mouth daily with breakfast.   atorvastatin  80 MG tablet Commonly known as: LIPITOR TAKE 1 TABLET BY MOUTH DAILY   Breztri  Aerosphere 160-9-4.8 MCG/ACT Aero inhaler Generic drug: budesonide -glycopyrrolate -formoterol  Inhale 2 puffs into the lungs 2 (two) times daily.   Coenzyme Q10 200 MG capsule Take 200 mg by mouth every evening.   gabapentin  100 MG capsule Commonly known as: NEURONTIN  Take 1 capsule (100 mg total) by mouth at bedtime.   HYDROcodone -acetaminophen  5-325 MG tablet Commonly known as: NORCO/VICODIN Take 1 tablet by mouth every 6 (six) hours as needed for moderate pain (pain score 4-6).   ipratropium 0.06 % nasal spray Commonly known as: ATROVENT  Place 2 sprays into both nostrils 4 (four) times daily as needed for rhinitis.   levothyroxine  100 MCG tablet Commonly known as: SYNTHROID  TAKE 1 TABLET BY MOUTH DAILY   meclizine  25 MG tablet Commonly known as: ANTIVERT  Take 1 tablet (25 mg total) by mouth 3 (three) times daily as needed for dizziness.   olmesartan   40 MG tablet Commonly known as: BENICAR  TAKE 1 TABLET BY MOUTH DAILY   Refresh 1.4-0.6 % Soln Generic drug: Polyvinyl Alcohol -Povidone PF Place 1 drop into both eyes daily as needed (dry eye).   vitamin E 180 MG (400 UNITS) capsule Take 400 Units by mouth every evening.         Follow-up Information     Rodolph Romano, MD Follow up in 2 week(s).   Specialty: General Surgery Why: 2 week follow-up robotic assisted lap cholecystectomy Contact information: [address] [place] [address] [phone]         Cook, Jayce G, DO Follow up in 1 week(s).   Specialty: Family Medicine Contact information: [address] [place] [address] [phone]                 No Known Allergies   The results of significant diagnostics from this hospitalization (including imaging, microbiology, ancillary and laboratory) are listed below for reference.   Consultations:   Procedures/Studies: DG C-Arm 1-60 Min-No Report Result Date: 08/11/2024 Fluoroscopy was utilized by the requesting physician.  No radiographic interpretation.   US  Abdomen Limited Result Date: 08/10/2024 CLINICAL DATA:  6216 Acute pancreatitis 6216 EXAM: ULTRASOUND ABDOMEN LIMITED RIGHT UPPER QUADRANT COMPARISON:  MRI abdomen from earlier the same day. FINDINGS: Gallbladder: Physiologically distended. Redemonstration of a single 1.9 x 2.1 cm sized gallstone. No abnormal wall thickening or pericholecystic free fluid. The technologist noted positive sonographic Murphy's sign. Common bile duct: Diameter: Up to 3.4 mm.  No intrahepatic bile duct dilation. Liver: No focal lesion identified. Within normal limits in parenchymal echogenicity. Portal vein is patent on color Doppler imaging with normal direction of blood flow towards the liver. Other: None. IMPRESSION: *Cholelithiasis without sonographic evidence of acute cholecystitis. However, please note technologist noted positive sonographic Murphy's sign. Correlate clinically. Electronically Signed   By: Ree Molt M.D.   On: 08/10/2024 14:28   MR ABDOMEN MRCP WO CONTRAST Result Date: 08/10/2024 CLINICAL DATA:  Pancreatitis, acute, severe. EXAM: MRI ABDOMEN WITHOUT CONTRAST  (INCLUDING MRCP) TECHNIQUE: Multiplanar multisequence MR imaging of the abdomen was performed. Heavily T2-weighted images of the biliary and pancreatic ducts were obtained, and three-dimensional MRCP images were rendered by post processing. COMPARISON:   Ultrasound abdomen and CT scan abdomen and pelvis from earlier the same day. FINDINGS: Lower chest: Note is made of elevated right hemidiaphragm with associated compressive atelectatic changes in the right lung base. Otherwise, unremarkable MR appearance to the lung bases. No pleural effusion. No pericardial effusion. Normal heart size. Hepatobiliary: The liver is normal in size. Noncirrhotic configuration. No focal liver lesion. No intra or extrahepatic bile duct dilation. Extrahepatic bile duct measures up to 6 mm, within normal limits for patient's age. However, there are at least 3, 4-5 mm sized calculi in the distal common bile duct. The gallbladder is physiologically distended. There is a single 1.8 x 2.3 cm gallstone. No abnormal wall thickening or pericholecystic fat stranding. Pancreas: There is subtle heterogeneity of the pancreas which exhibit mild surrounding fat stranding, compatible with acute pancreatitis. No peripancreatic collection. No focal pancreatic lesion. Main pancreatic duct is not dilated. Spleen:  Within normal limits in size and appearance. No focal mass. Adrenals/Urinary Tract: Unremarkable adrenal glands. No hydroureteronephrosis. No suspicious renal mass. Stomach/Bowel: Visualized portions within the abdomen are unremarkable. No disproportionate dilation of bowel loops. Unremarkable appendix. Vascular/Lymphatic: No pathologically enlarged lymph nodes identified. No abdominal aortic aneurysm demonstrated. No ascites. Other:  None. Musculoskeletal: No  suspicious bone lesions identified. IMPRESSION: 1. There is subtle heterogeneity of the pancreas which exhibit mild surrounding fat stranding, compatible with acute interstitial pancreatitis. No peripancreatic collection. No focal pancreatic lesion. Main pancreatic duct is not dilated. 2. There is a single 1.8 x 2.3 cm gallstone without imaging signs of acute cholecystitis. 3. There are at least 3, 4-5 mm sized calculi in the distal common  bile duct. However, there is no intra or extrahepatic bile duct dilation. 4. Elevated right hemidiaphragm with associated compressive atelectatic changes in the right lung base. Electronically Signed   By: Ree Molt M.D.   On: 08/10/2024 09:25   MR 3D Recon At Scanner Result Date: 08/10/2024 CLINICAL DATA:  Pancreatitis, acute, severe. EXAM: MRI ABDOMEN WITHOUT CONTRAST  (INCLUDING MRCP) TECHNIQUE: Multiplanar multisequence MR imaging of the abdomen was performed. Heavily T2-weighted images of the biliary and pancreatic ducts were obtained, and three-dimensional MRCP images were rendered by post processing. COMPARISON:  Ultrasound abdomen and CT scan abdomen and pelvis from earlier the same day. FINDINGS: Lower chest: Note is made of elevated right hemidiaphragm with associated compressive atelectatic changes in the right lung base. Otherwise, unremarkable MR appearance to the lung bases. No pleural effusion. No pericardial effusion. Normal heart size. Hepatobiliary: The liver is normal in size. Noncirrhotic configuration. No focal liver lesion. No intra or extrahepatic bile duct dilation. Extrahepatic bile duct measures up to 6 mm, within normal limits for patient's age. However, there are at least 3, 4-5 mm sized calculi in the distal common bile duct. The gallbladder is physiologically distended. There is a single 1.8 x 2.3 cm gallstone. No abnormal wall thickening or pericholecystic fat stranding. Pancreas: There is subtle heterogeneity of the pancreas which exhibit mild surrounding fat stranding, compatible with acute pancreatitis. No peripancreatic collection. No focal pancreatic lesion. Main pancreatic duct is not dilated. Spleen:  Within normal limits in size and appearance. No focal mass. Adrenals/Urinary Tract: Unremarkable adrenal glands. No hydroureteronephrosis. No suspicious renal mass. Stomach/Bowel: Visualized portions within the abdomen are unremarkable. No disproportionate dilation of bowel  loops. Unremarkable appendix. Vascular/Lymphatic: No pathologically enlarged lymph nodes identified. No abdominal aortic aneurysm demonstrated. No ascites. Other:  None. Musculoskeletal: No suspicious bone lesions identified. IMPRESSION: 1. There is subtle heterogeneity of the pancreas which exhibit mild surrounding fat stranding, compatible with acute interstitial pancreatitis. No peripancreatic collection. No focal pancreatic lesion. Main pancreatic duct is not dilated. 2. There is a single 1.8 x 2.3 cm gallstone without imaging signs of acute cholecystitis. 3. There are at least 3, 4-5 mm sized calculi in the distal common bile duct. However, there is no intra or extrahepatic bile duct dilation. 4. Elevated right hemidiaphragm with associated compressive atelectatic changes in the right lung base. Electronically Signed   By: Ree Molt M.D.   On: 08/10/2024 09:25   CT ABDOMEN PELVIS W CONTRAST Result Date: 08/10/2024 EXAM: CT ABDOMEN AND PELVIS WITH CONTRAST 08/10/2024 03:33:50 AM TECHNIQUE: CT of the abdomen and pelvis was performed with the administration of intravenous contrast. Multiplanar reformatted images are provided for review. Automated exposure control, iterative reconstruction, and/or weight-based adjustment of the mA/kV was utilized to reduce the radiation dose to as low as reasonably achievable. COMPARISON: 10/09/2019 CLINICAL HISTORY: Pancreatitis suspected. Pt c/o upper abd pain/ substernal chest pain that started about 7pm. Pt states the pain has been constant and hasn't changed since it started. Denies cardiac hx. FINDINGS: LOWER CHEST: Eventration of the right hemidiaphragm with associated right basilar atelectasis. LIVER: Unremarkable. GALLBLADDER AND  BILE DUCTS: Layering 2.0 cm gallstone (image 30), without associated inflammatory changes. SPLEEN: Unremarkable. PANCREAS: Pancreatic inflammatory changes most prominent along the pancreatic tail (image 34), reflecting acute pancreatitis.  No walled off fluid collection/pseudocyst. No pancreatic necrosis. ADRENAL GLANDS: Unremarkable. KIDNEYS, URETERS AND BLADDER: Kidneys are within normal limits. No renal, ureteral, or bladder calculi. No hydronephrosis. Bladder is within normal limits. GI AND BOWEL: Secondary inflammatory changes involving the second/third portion of the duodenum (image 50), suggesting superimposed duodenitis. Normal appendix (image 58). Left colonic diverticulosis, without evidence of diverticulitis. PERITONEUM AND RETROPERITONEUM: No ascites or free air. VASCULATURE: Thoracic aortic atherosclerosis. Atherosclerotic calcifications of the abdominal aorta and branch vessels. Mild coronary artery insufficiency of the LAD and right coronary artery. LYMPH NODES: No lymphadenopathy. REPRODUCTIVE ORGANS: Prostate is notable for dystrophic calcifications. BONES AND SOFT TISSUES: Mild degenerative changes of the lumbar spine. IMPRESSION: 1. Acute uncomplicated pancreatitis. 2. Suspected secondary duodenitis. 3. Cholelithiasis, without associated inflammatory changes. Electronically signed by: Pinkie Pebbles MD 08/10/2024 03:40 AM EDT RP Workstation: [id number]   DG Chest Portable 1 View Result Date: 08/10/2024 EXAM: 1 VIEW XRAY OF THE CHEST 08/10/2024 12:27:39 AM COMPARISON: 06/20/2019 CLINICAL HISTORY: Chest pain. Pt c/o upper abd pain/ substernal chest pain that started about 7pm. Pt states the pain has been constant and hasn't changed since it started. Denies cardiac hx. FINDINGS: LUNGS AND PLEURA: Mild left basilar opacity, likely atelectasis. No pulmonary edema. No pleural effusion. No pneumothorax. HEART AND MEDIASTINUM: No acute abnormality of the cardiac and mediastinal silhouettes. BONES AND SOFT TISSUES: Stable mild eventration of the right hemidiaphragm. No acute osseous abnormality. IMPRESSION: 1. Mild left basilar opacity, likely atelectasis. Electronically signed by: Pinkie Pebbles MD 08/10/2024 12:30 AM EDT RP  Workstation: [id number]      Labs: BNP (last 3 results) No results for input(s): BNP in the last 8760 hours. Basic Metabolic Panel: Recent Labs  Lab 08/10/24 0016 08/10/24 0214 08/11/24 0404 08/12/24 0232  NA 141  --  135 139  K 3.5  --  3.9 3.8  CL 105  --  103 106  CO2 25  --  25 26  GLUCOSE 136*  --  78 125*  BUN 26*  --  21 17  CREATININE 0.85 0.80 0.81 1.00  CALCIUM  9.2  --  8.7* 8.6*  MG  --  1.9  --   --   PHOS  --  2.1*  --   --    Liver Function Tests: Recent Labs  Lab 08/10/24 0016 08/11/24 0404 08/12/24 0232  AST 89* 54* 39  ALT 92* 90* 66*  ALKPHOS 134* 97 126  BILITOT 1.5* 3.5* 1.9*  PROT 5.9* 5.6* 5.8*  ALBUMIN 3.4* 2.9* 3.0*   Recent Labs  Lab 08/10/24 0016 08/11/24 0404  LIPASE >2,800* 299*   No results for input(s): AMMONIA in the last 168 hours. CBC: Recent Labs  Lab 08/10/24 0016 08/11/24 0404 08/12/24 0232  WBC 13.4* 9.2 7.2  NEUTROABS 12.1*  --   --   HGB 13.4 12.0* 12.0*  HCT 40.1 36.6* 35.9*  MCV 97.8 97.1 96.5  PLT 253 213 184   Cardiac Enzymes: No results for input(s): CKTOTAL, CKMB, CKMBINDEX, TROPONINI in the last 168 hours. BNP: Invalid input(s): POCBNP CBG: Recent Labs  Lab 08/13/24 0957 08/13/24 1236 08/13/24 1727 08/13/24 2016 08/14/24 0756  GLUCAP 99 140* 250* 270* 174*   D-Dimer No results for input(s): DDIMER in the last 72 hours. Hgb A1c No results for input(s): HGBA1C in the last 72  hours. Lipid Profile No results for input(s): CHOL, HDL, LDLCALC, TRIG, CHOLHDL, LDLDIRECT in the last 72 hours. Thyroid  function studies No results for input(s): TSH, T4TOTAL, T3FREE, THYROIDAB in the last 72 hours.  Invalid input(s): FREET3 Anemia work up No results for input(s): VITAMINB12, FOLATE, FERRITIN, TIBC, IRON, RETICCTPCT in the last 72 hours. Urinalysis    Component Value Date/Time   COLORURINE AMBER (A) 10/09/2019 0735   APPEARANCEUR HAZY (A)  10/09/2019 0735   LABSPEC 1.024 10/09/2019 0735   PHURINE 5.0 10/09/2019 0735   GLUCOSEU NEGATIVE 10/09/2019 0735   HGBUR SMALL (A) 10/09/2019 0735   BILIRUBINUR NEGATIVE 10/09/2019 0735   KETONESUR 5 (A) 10/09/2019 0735   PROTEINUR 100 (A) 10/09/2019 0735   UROBILINOGEN 0.2 11/22/2013 1225   NITRITE NEGATIVE 10/09/2019 0735   LEUKOCYTESUR NEGATIVE 10/09/2019 0735   Sepsis Labs Recent Labs  Lab 08/10/24 0016 08/11/24 0404 08/12/24 0232  WBC 13.4* 9.2 7.2   Microbiology No results found for this or any previous visit (from the past 240 hours).   Total time spend on discharging this patient, including the last patient exam, discussing the hospital stay, instructions for ongoing care as it relates to all pertinent caregivers, as well as preparing the medical discharge records, prescriptions, and/or referrals as applicable, is 25 minutes.    Ellouise Haber, MD  [facility] 08/14/2024, 8:52 AM

## 2024-08-14 NOTE — Discharge Instructions (Signed)

## 2024-08-14 NOTE — Plan of Care (Signed)

## 2024-08-15 ENCOUNTER — Telehealth: Payer: Self-pay | Admitting: *Deleted

## 2024-08-15 LAB — SURGICAL PATHOLOGY

## 2024-08-15 NOTE — Transitions of Care (Post Inpatient/ED Visit) (Signed)
   08/15/2024  Name: Cameron Smith MRN: [id number] DOB: August 05, 1941  Today's TOC FU Call Status: Today's TOC FU Call Status:: Unsuccessful Call (1st Attempt) Unsuccessful Call (1st Attempt) Date: 08/15/24  Attempted to reach the patient regarding the most recent Inpatient/ED visit.  Follow Up Plan: Additional outreach attempts will be made to reach the patient to complete the Transitions of Care (Post Inpatient/ED visit) call.   Mliss Creed [facility], BSN RN Care Manager/ Transition of Care [place]/ [facility] [phone]

## 2024-08-15 NOTE — Transitions of Care (Post Inpatient/ED Visit) (Signed)
   08/15/2024  Name: Cameron Smith MRN: [id number] DOB: [birth year]  Today's TOC FU Call Status: Today's TOC FU Call Status:: Unsuccessful Call (2nd Attempt) Unsuccessful Call (2nd Attempt) Date: 08/15/24  Attempted to reach the patient regarding the most recent Inpatient/ED visit.  Follow Up Plan: Additional outreach attempts will be made to reach the patient to complete the Transitions of Care (Post Inpatient/ED visit) call.   Mliss Creed [facility], BSN RN Care Manager/ Transition of Care [place]/ [facility] [phone]

## 2024-08-18 ENCOUNTER — Telehealth: Payer: Self-pay | Admitting: *Deleted

## 2024-08-18 NOTE — Transitions of Care (Post Inpatient/ED Visit) (Signed)
 08/18/2024  Name: Cameron Smith MRN: [id number] DOB: 1941-03-11  Today's TOC FU Call Status: Today's TOC FU Call Status:: Successful TOC FU Call Completed TOC FU Call Complete Date: 08/18/24 Patient's Name and Date of Birth confirmed.  Transition Care Management Follow-up Telephone Call How have you been since you were released from the hospital?: Better (eating, drinking well, independent in all aspects of his care) Any questions or concerns?: No  Items Reviewed: Did you receive and understand the discharge instructions provided?: Yes Medications obtained,verified, and reconciled?: Yes (Medications Reviewed) Any new allergies since your discharge?: No Dietary orders reviewed?: Yes Type of Diet Ordered:: heart healthy Do you have support at home?: Yes People in Home [RPT]: spouse Name of Support/Comfort Primary Source: [place] [employer]  DPR Spoke with spouse Torez Beauregard DPR who reports pt is doing well, states  we don't have any needs, don't need anything Reviewed signs /symptoms infection, reportable signs/ symptoms  Medications Reviewed Today: Medications Reviewed Today     Reviewed by Aura Mliss LABOR, RN (Registered Nurse) on 08/18/24 at 1412  Med List Status: <None>   Medication Order Taking? Sig Documenting Provider Last Dose Status Informant  albuterol  (VENTOLIN  HFA) 108 (90 Base) MCG/ACT inhaler [id number] Yes Inhale 1-2 puffs into the lungs every 6 (six) hours as needed for wheezing or shortness of breath. Cook, Jayce G, DO  Active Spouse/Significant Other, Pharmacy Records  amLODipine  (NORVASC ) 5 MG tablet [id number] Yes TAKE 1 TABLET BY MOUTH DAILY Cook, Jayce G, DO  Active Spouse/Significant Other, Pharmacy Records  aspirin  EC 81 MG tablet [id number] Yes Take 1 tablet (81 mg total) by mouth daily with breakfast. Pearlean Manus, MD  Active Spouse/Significant Other, Pharmacy Records  atorvastatin  (LIPITOR) 80 MG tablet [id number] Yes TAKE 1 TABLET BY MOUTH DAILY Cook, Jayce G,  DO  Active Spouse/Significant Other, Pharmacy Records  budeson-glycopyrrolate -formoterol  (BREZTRI  AEROSPHERE) 160-9-4.8 MCG/ACT AERO inhaler [id number] Yes Inhale 2 puffs into the lungs 2 (two) times daily. Cook, Jayce G, DO  Active Spouse/Significant Other, Pharmacy Records  Coenzyme Q10 200 MG capsule [id number] Yes Take 200 mg by mouth every evening. [provider]  Active Spouse/Significant Other, Pharmacy Records  gabapentin  (NEURONTIN ) 100 MG capsule [id number] Yes Take 1 capsule (100 mg total) by mouth at bedtime. Margrette Taft BRAVO, MD  Active Spouse/Significant Other, Pharmacy Records  HYDROcodone -acetaminophen  (NORCO/VICODIN) 5-325 MG tablet [id number]  Take 1 tablet by mouth every 6 (six) hours as needed for moderate pain (pain score 4-6).  Patient not taking: Reported on 08/18/2024   Awanda [place], MD  Active   ipratropium (ATROVENT ) 0.06 % nasal spray [id number] Yes Place 2 sprays into both nostrils 4 (four) times daily as needed for rhinitis. Cook, Jayce G, DO  Active Spouse/Significant Other, Pharmacy Records  levothyroxine  (SYNTHROID ) 100 MCG tablet [id number] Yes TAKE 1 TABLET BY MOUTH DAILY Cook, Jayce G, DO  Active Spouse/Significant Other, Pharmacy Records  meclizine  (ANTIVERT ) 25 MG tablet [id number] Yes Take 1 tablet (25 mg total) by mouth 3 (three) times daily as needed for dizziness. Mauro Elveria BROCKS, NP  Active Spouse/Significant Other, Pharmacy Records  olmesartan  (BENICAR ) 40 MG tablet [id number] Yes TAKE 1 TABLET BY MOUTH DAILY Wert, Michael B, MD  Active Spouse/Significant Other, Pharmacy Records  Polyvinyl Alcohol -Povidone PF (REFRESH) 1.4-0.6 % SOLN [id number] Yes Place 1 drop into both eyes daily as needed (dry eye). [provider]  Active Spouse/Significant Other, Pharmacy Records  vitamin E 400 UNIT capsule [id number] Yes Take 400 Units by mouth every evening.  [provider]  Active Spouse/Significant Other, Pharmacy Records            Home  Care and Equipment/Supplies: Were Home Health Services Ordered?: No Any new equipment or medical supplies ordered?: No  Functional Questionnaire: Do you need assistance with bathing/showering or dressing?: No Do you need assistance with meal preparation?: No Do you need assistance with eating?: No Do you have difficulty maintaining continence: No Do you need assistance with getting out of bed/getting out of a chair/moving?: No Do you have difficulty managing or taking your medications?: No  Follow up appointments reviewed: PCP Follow-up appointment confirmed?: Yes Date of PCP follow-up appointment?: 08/20/24 Follow-up Provider: Jacqulyn Ahle DO Specialist [facility] Follow-up appointment confirmed?: Yes Date of Specialist follow-up appointment?: 08/29/24 Follow-Up Specialty Provider:: Dr. Rodolph  surgeon Do you need transportation to your follow-up appointment?: No Do you understand care options if your condition(s) worsen?: Yes-patient verbalized understanding  SDOH Interventions Today    Flowsheet Row Most Recent Value  SDOH Interventions   Food Insecurity Interventions Intervention Not Indicated  Housing Interventions Intervention Not Indicated  Transportation Interventions Intervention Not Indicated  Utilities Interventions Intervention Not Indicated    Mliss Creed [facility], BSN RN Care Manager/ Transition of Care [place]/ [facility] [phone]

## 2024-08-20 ENCOUNTER — Ambulatory Visit: Admitting: Family Medicine

## 2024-08-20 ENCOUNTER — Encounter: Payer: Self-pay | Admitting: Family Medicine

## 2024-08-20 VITALS — BP 111/67 | HR 72 | Ht 68.0 in | Wt 184.0 lb

## 2024-08-20 DIAGNOSIS — I10 Essential (primary) hypertension: Secondary | ICD-10-CM | POA: Diagnosis not present

## 2024-08-20 DIAGNOSIS — E119 Type 2 diabetes mellitus without complications: Secondary | ICD-10-CM | POA: Diagnosis not present

## 2024-08-20 DIAGNOSIS — K851 Biliary acute pancreatitis without necrosis or infection: Secondary | ICD-10-CM

## 2024-08-20 DIAGNOSIS — K831 Obstruction of bile duct: Secondary | ICD-10-CM

## 2024-08-20 MED ORDER — BLOOD GLUCOSE TEST VI STRP: ORAL_STRIP | 0 refills | Status: AC

## 2024-08-20 MED ORDER — LANCETS MISC. MISC: 0 refills | Status: AC

## 2024-08-20 MED ORDER — LANCET DEVICE MISC
0 refills | Status: AC
Start: 2024-08-20 — End: ?

## 2024-08-20 MED ORDER — BLOOD GLUCOSE MONITORING SUPPL DEVI
0 refills | Status: AC
Start: 2024-08-20 — End: ?

## 2024-08-20 NOTE — Assessment & Plan Note (Addendum)
 Glucometer and supplies sent in so that he can check his blood sugar regularly.

## 2024-08-20 NOTE — Assessment & Plan Note (Signed)
 BP very well-controlled.  Discontinuing amlodipine .  Already has scheduled follow-up.

## 2024-08-20 NOTE — Progress Notes (Signed)
 Subjective:  Patient ID: Cameron Smith, male    DOB: 20-Sep-1941  Age: 83 y.o. MRN: [id number]  CC:   Chief Complaint  Patient presents with   Hospitalization Follow-up    HPI:  83 year old male presents for hospital follow-up.  Patient admitted from 9/20 to 9/25.  Hospital course, discharge summary, labs, imaging reviewed.   In summary: Presented with abdominal pain and was found to have gallstone pancreatitis (secondary to cholelithiasis and choledocholithiasis).  Underwent ERCP and cholecystectomy.  Lipase was initially greater than 2800.  Trended down to 299.  Patient did well during hospitalization and was discharged home in stable condition.  Patient reports that his blood sugars were labile during his hospitalization.  Patient presents today for follow-up.  He is doing well.  He is back to his normal diet.  His incisions from cholecystectomy are healing well.  He is interested in getting a glucometer to check his blood sugars.  Last A1c was 6.7.  Additionally, he states that his blood pressure has been very well controlled.  He is interested in discussing decreased or discontinuing some of his medication.  Patient Active Problem List   Diagnosis Date Noted   Calculus of common duct 08/11/2024   Acute gallstone pancreatitis 08/10/2024   Hypoalbuminemia due to protein-calorie malnutrition 08/10/2024   GERD (gastroesophageal reflux disease) 08/10/2024   Reactive airways dysfunction syndrome (HCC) 08/10/2024   Erectile dysfunction 03/21/2024   Upper airway cough syndrome vs cough variant asthma 03/14/2023   Type 2 diabetes, diet controlled (HCC) 12/15/2021   History of subdural hematoma (post traumatic) 04/17/2017   Essential hypertension 07/08/2013   Mixed hyperlipidemia 07/08/2013   Acquired hypothyroidism 07/08/2013    Social Hx   Social History   Socioeconomic History   Marital status: Married    Spouse name: Not on file   Number of children: Not on file   Years of  education: Not on file   Highest education level: Not on file  Occupational History   Not on file  Tobacco Use   Smoking status: Former    Current packs/day: 0.00    Average packs/day: 2.0 packs/day for 26.0 years (52.0 ttl pk-yrs)    Types: Cigarettes    Start date: 12/28/1957    Quit date: 11/29/1983    Years since quitting: 40.7   Smokeless tobacco: Never  Vaping Use   Vaping status: Never Used  Substance and Sexual Activity   Alcohol  use: No   Drug use: No   Sexual activity: Not on file  Other Topics Concern   Not on file  Social History Narrative   Married x 60 years. 2 children, 4 grands, 4 great grands   Social Drivers of Health   Financial Resource Strain: Low Risk  (09/14/2023)   Overall Financial Resource Strain (CARDIA)    Difficulty of Paying Living Expenses: Not hard at all  Food Insecurity: No Food Insecurity (08/18/2024)   Hunger Vital Sign    Worried About Running Out of Food in the Last Year: Never true    Ran Out of Food in the Last Year: Never true  Transportation Needs: No Transportation Needs (08/18/2024)   PRAPARE - Administrator, Civil Service (Medical): No    Lack of Transportation (Non-Medical): No  Physical Activity: Inactive (09/14/2023)   Exercise Vital Sign    Days of Exercise per Week: 0 days    Minutes of Exercise per Session: 0 min  Stress: No Stress Concern Present (09/14/2023)  [employer] of Occupational Health - Occupational Stress Questionnaire    Feeling of Stress : Not at all  Social Connections: Moderately Integrated (08/10/2024)   Social Connection and Isolation Panel    Frequency of Communication with Friends and Family: More than three times a week    Frequency of Social Gatherings with Friends and Family: More than three times a week    Attends Religious Services: More than 4 times per year    Active Member of [employer] or Organizations: No    Attends Banker Meetings: Patient declined    Marital  Status: Married    Review of Systems  Constitutional: Negative.   Gastrointestinal: Negative.    Objective:  BP 111/67   Pulse 72   Ht 5' 8 (1.727 m)   Wt 184 lb (83.5 kg)   BMI 27.98 kg/m      08/20/2024    9:56 AM 08/14/2024    7:54 AM 08/14/2024    6:00 AM  BP/Weight  Systolic BP 111 146 153  Diastolic BP 67 67 69  Wt. (Lbs) 184    BMI 27.98 kg/m2      Physical Exam Constitutional:      General: He is not in acute distress.    Appearance: Normal appearance.  HENT:     Head: Normocephalic and atraumatic.  Cardiovascular:     Rate and Rhythm: Normal rate and regular rhythm.  Pulmonary:     Effort: Pulmonary effort is normal.     Breath sounds: Normal breath sounds. No wheezing, rhonchi or rales.  Abdominal:     General: There is no distension.     Palpations: Abdomen is soft.     Tenderness: There is no abdominal tenderness.  Neurological:     Mental Status: He is alert.     Lab Results  Component Value Date   WBC 7.2 08/12/2024   HGB 12.0 (L) 08/12/2024   HCT 35.9 (L) 08/12/2024   PLT 184 08/12/2024   GLUCOSE 125 (H) 08/12/2024   CHOL 129 03/17/2024   TRIG 70 03/17/2024   HDL 41 03/17/2024   LDLCALC 74 03/17/2024   ALT 66 (H) 08/12/2024   AST 39 08/12/2024   NA 139 08/12/2024   K 3.8 08/12/2024   CL 106 08/12/2024   CREATININE 1.00 08/12/2024   BUN 17 08/12/2024   CO2 26 08/12/2024   TSH 1.710 03/17/2024   PSA 0.44 01/15/2015   INR 0.92 12/20/2009   HGBA1C 6.7 (H) 03/17/2024     Assessment & Plan:  Acute gallstone pancreatitis Assessment & Plan: Doing well following ERCP and cholecystectomy.  Hospital course, discharge summary, etc. reviewed.  Patient is back to his normal diet.  Supportive care.   Essential hypertension Assessment & Plan: BP very well-controlled.  Discontinuing amlodipine .  Already has scheduled follow-up.   Type 2 diabetes, diet controlled (HCC) Assessment & Plan: Glucometer and supplies sent in so that he can  check his blood sugar regularly.  Orders: -     Blood Glucose Monitoring Suppl; Use to take blood sugar once daily. May substitute to any manufacturer covered by patient's insurance.  Dispense: 1 each; Refill: 0 -     Blood Glucose Test; Use to take blood sugar once daily. May substitute to any manufacturer covered by patient's insurance.May substitute to any manufacturer covered by patient's insurance.  Dispense: 100 strip; Refill: 0 -     Lancet Device; Use to take blood sugar once daily. May substitute to any  manufacturer covered by [employer].May substitute to any manufacturer covered by patient's insurance.  Dispense: 1 each; Refill: 0 -     Lancets Misc.; Use to take blood sugar once daily. May substitute to any manufacturer covered by patient's insurance.May substitute to any manufacturer covered by patient's insurance.  Dispense: 100 each; Refill: 0    Follow-up: Has follow-up on 11/3  Jacqulyn Ahle DO [facility]

## 2024-08-20 NOTE — Assessment & Plan Note (Signed)
 Doing well following ERCP and cholecystectomy.  Hospital course, discharge summary, etc. reviewed.  Patient is back to his normal diet.  Supportive care.

## 2024-08-20 NOTE — Patient Instructions (Addendum)
 Stop amlodipine .  I will see you next month.  Check blood sugar a couple of times a week.  Take care  Dr. Bluford

## 2024-09-11 NOTE — Telephone Encounter (Signed)
-   has an upcoming appt on 09/22/24-- Copied from CRM #[id number]. Topic: Clinical - Request for Lab/Test Order >> Sep 11, 2024  8:48 AM Ahlexyia S wrote: Reason for CRM: Pt wife called in requesting pt have blood work done for his physical. Rock is requesting a call back when those orders get sent in.

## 2024-09-13 ENCOUNTER — Other Ambulatory Visit: Payer: Self-pay | Admitting: Family Medicine

## 2024-09-13 DIAGNOSIS — D649 Anemia, unspecified: Secondary | ICD-10-CM

## 2024-09-13 DIAGNOSIS — E119 Type 2 diabetes mellitus without complications: Secondary | ICD-10-CM

## 2024-09-13 DIAGNOSIS — E039 Hypothyroidism, unspecified: Secondary | ICD-10-CM

## 2024-09-13 DIAGNOSIS — E782 Mixed hyperlipidemia: Secondary | ICD-10-CM

## 2024-09-18 ENCOUNTER — Ambulatory Visit: Payer: Self-pay | Admitting: Family Medicine

## 2024-09-18 LAB — CMP14+EGFR
ALT: 20 IU/L (ref 0–44)
AST: 18 IU/L (ref 0–40)
Albumin: 4.1 g/dL (ref 3.7–4.7)
Alkaline Phosphatase: 82 IU/L (ref 48–129)
BUN/Creatinine Ratio: 14 (ref 10–24)
BUN: 13 mg/dL (ref 8–27)
Bilirubin Total: 0.7 mg/dL (ref 0.0–1.2)
CO2: 23 mmol/L (ref 20–29)
Calcium: 9.4 mg/dL (ref 8.6–10.2)
Chloride: 106 mmol/L (ref 96–106)
Creatinine, Ser: 0.91 mg/dL (ref 0.76–1.27)
Globulin, Total: 2 g/dL (ref 1.5–4.5)
Glucose: 90 mg/dL (ref 70–99)
Potassium: 4.8 mmol/L (ref 3.5–5.2)
Sodium: 144 mmol/L (ref 134–144)
Total Protein: 6.1 g/dL (ref 6.0–8.5)
eGFR: 84 mL/min/1.73 (ref >59)

## 2024-09-18 LAB — CBC
Hematocrit: 45.5 % (ref 37.5–51.0)
Hemoglobin: 14.8 g/dL (ref 13.0–17.7)
MCH: 31.8 pg (ref 26.6–33.0)
MCHC: 32.5 g/dL (ref 31.5–35.7)
MCV: 98 fL — ABNORMAL HIGH (ref 79–97)
Platelets: 182 x10E3/uL (ref 150–450)
RBC: 4.65 x10E6/uL (ref 4.14–5.80)
RDW: 13.2 % (ref 11.6–15.4)
WBC: 7 x10E3/uL (ref 3.4–10.8)

## 2024-09-18 LAB — LIPID PANEL
Chol/HDL Ratio: 3.2 ratio (ref 0.0–5.0)
Cholesterol, Total: 139 mg/dL (ref 100–199)
HDL: 43 mg/dL (ref >39)
LDL Chol Calc (NIH): 78 mg/dL (ref 0–99)
Triglycerides: 99 mg/dL (ref 0–149)
VLDL Cholesterol Cal: 18 mg/dL (ref 5–40)

## 2024-09-18 LAB — HEMOGLOBIN A1C
Est. average glucose Bld gHb Est-mCnc: 148 mg/dL
Hgb A1c MFr Bld: 6.8 % — ABNORMAL HIGH (ref 4.8–5.6)

## 2024-09-18 LAB — MICROALBUMIN / CREATININE URINE RATIO
Creatinine, Urine: 162.8 mg/dL
Microalb/Creat Ratio: 12 mg/g{creat} (ref 0–29)
Microalbumin, Urine: 19.7 ug/mL

## 2024-09-18 LAB — TSH: TSH: 1.04 u[IU]/mL (ref 0.450–4.500)

## 2024-09-19 ENCOUNTER — Ambulatory Visit: Payer: Medicare Other

## 2024-09-19 VITALS — Ht 68.0 in | Wt 184.0 lb

## 2024-09-19 DIAGNOSIS — Z0000 Encounter for general adult medical examination without abnormal findings: Secondary | ICD-10-CM | POA: Diagnosis not present

## 2024-09-19 NOTE — Progress Notes (Signed)
 Subjective:   Cameron Smith is a 83 y.o. who presents for a Medicare Wellness preventive visit.  As a reminder, Annual Wellness Visits don't include a physical exam, and some assessments may be limited, especially if this visit is performed virtually. We may recommend an in-person follow-up visit with your provider if needed.  Visit Complete: Virtual I connected with  Cameron Smith on 09/19/24 by a video and audio enabled telemedicine application and verified that I am speaking with the correct person using two identifiers.  Patient Location: Home  Provider Location: Home Office  I discussed the limitations of evaluation and management by telemedicine. The patient expressed understanding and agreed to proceed.  Vital Signs: Because this visit was a virtual/telehealth visit, some criteria may be missing or patient reported. Any vitals not documented were not able to be obtained and vitals that have been documented are patient reported.  Persons Participating in Visit: Patient.  AWV Questionnaire: No: Patient Medicare AWV questionnaire was not completed prior to this visit.  Cardiac Risk Factors include: advanced age (>23men, >67 women);diabetes mellitus;dyslipidemia;hypertension;male gender     Objective:    Today's Vitals   09/19/24 1033  Weight: 184 lb (83.5 kg)  Height: 5' 8 (1.727 m)   Body mass index is 27.98 kg/m.     09/19/2024   10:36 AM 08/13/2024    9:59 AM 08/11/2024   12:01 PM 08/10/2024    5:15 PM 08/10/2024    5:13 AM 08/09/2024   11:55 PM 09/14/2023   11:43 AM  Advanced Directives  Does Patient Have a Medical Advance Directive? No No No No No No No  Would patient like information on creating a medical advance directive? Yes (MAU/Ambulatory/Procedural Areas - Information given) No - Patient declined  No - Patient declined No - Patient declined No - Patient declined No - Patient declined    Current Medications (verified) Outpatient Encounter Medications as  of 09/19/2024  Medication Sig   albuterol  (VENTOLIN  HFA) 108 (90 Base) MCG/ACT inhaler Inhale 1-2 puffs into the lungs every 6 (six) hours as needed for wheezing or shortness of breath.   aspirin  EC 81 MG tablet Take 1 tablet (81 mg total) by mouth daily with breakfast.   atorvastatin  (LIPITOR) 80 MG tablet TAKE 1 TABLET BY MOUTH DAILY   Blood Glucose Monitoring Suppl DEVI Use to take blood sugar once daily. May substitute to any manufacturer covered by patient's insurance.   budeson-glycopyrrolate -formoterol  (BREZTRI  AEROSPHERE) 160-9-4.8 MCG/ACT AERO inhaler Inhale 2 puffs into the lungs 2 (two) times daily.   Coenzyme Q10 200 MG capsule Take 200 mg by mouth every evening.   gabapentin  (NEURONTIN ) 100 MG capsule Take 1 capsule (100 mg total) by mouth at bedtime.   Glucose Blood (BLOOD GLUCOSE TEST STRIPS) STRP Use to take blood sugar once daily. May substitute to any manufacturer covered by patient's insurance.May substitute to any manufacturer covered by patient's insurance.   ipratropium (ATROVENT ) 0.06 % nasal spray Place 2 sprays into both nostrils 4 (four) times daily as needed for rhinitis.   Lancet Device MISC Use to take blood sugar once daily. May substitute to any manufacturer covered by patient's insurance.May substitute to any manufacturer covered by patient's insurance.   Lancets Misc. MISC Use to take blood sugar once daily. May substitute to any manufacturer covered by patient's insurance.May substitute to any manufacturer covered by patient's insurance.   levothyroxine  (SYNTHROID ) 100 MCG tablet TAKE 1 TABLET BY MOUTH DAILY   meclizine  (ANTIVERT ) 25 MG  tablet Take 1 tablet (25 mg total) by mouth 3 (three) times daily as needed for dizziness.   olmesartan  (BENICAR ) 40 MG tablet TAKE 1 TABLET BY MOUTH DAILY   Polyvinyl Alcohol -Povidone PF (REFRESH) 1.4-0.6 % SOLN Place 1 drop into both eyes daily as needed (dry eye).   vitamin E 400 UNIT capsule Take 400 Units by mouth every evening.     No facility-administered encounter medications on file as of 09/19/2024.    Allergies (verified) Patient has no known allergies.   History: Past Medical History:  Diagnosis Date   Acute inflammation of nasal sinus    Chronic   Anxiety    Diverticulitis    Hypercholesteremia    Hypertension    Hypothyroidism    Nasal turbinate hypertrophy    Reactive airways dysfunction syndrome [facility])    Past Surgical History:  Procedure Laterality Date   BACK SURGERY     sciatic   COLONOSCOPY  10/31/2011   Adequate prep, extensive left-sided diverticula, colon otherwise normal.   COLONOSCOPY WITH PROPOFOL  N/A 04/19/2022   Procedure: COLONOSCOPY WITH PROPOFOL ;  Surgeon: Shaaron Lamar HERO, MD;  Location: [facility];  Service: Endoscopy;  Laterality: N/A;  11:15am   ERCP N/A 08/11/2024   Procedure: ERCP, WITH INTERVENTION IF INDICATED;  Surgeon: Jinny Carmine, MD;  Location: [facility] ENDOSCOPY;  Service: Endoscopy;  Laterality: N/A;   NASAL SEPTOPLASTY W/ TURBINOPLASTY Bilateral 08/22/2019   Procedure: Sinus Endoscopy   Nasal Septoplasty with Bilateral TURBINATE REDUCTION, Bilateral Total Ethmoidectomy and Sphenoidotomy;  Surgeon: Carlie Clark, MD;  Location: [facility] OR;  Service: ENT;  Laterality: Bilateral;   NASAL SINUS SURGERY     Family History  Problem Relation Age of Onset   Heart disease Father    Diabetes Sister    Colon cancer Neg Hx    Colon polyps Neg Hx    Social History   Socioeconomic History   Marital status: Married    Spouse name: Not on file   Number of children: Not on file   Years of education: Not on file   Highest education level: Not on file  Occupational History   Not on file  Tobacco Use   Smoking status: Former    Current packs/day: 0.00    Average packs/day: 2.0 packs/day for 26.0 years (52.0 ttl pk-yrs)    Types: Cigarettes    Start date: 12/28/1957    Quit date: 11/29/1983    Years since quitting: 40.8   Smokeless tobacco: Never  Vaping Use   Vaping  status: Never Used  Substance and Sexual Activity   Alcohol  use: No   Drug use: No   Sexual activity: Not on file  Other Topics Concern   Not on file  Social History Narrative   Married x 60 years. 2 children, 4 grands, 4 great grands   Social Drivers of Health   Financial Resource Strain: Low Risk  (09/19/2024)   Overall Financial Resource Strain (CARDIA)    Difficulty of Paying Living Expenses: Not hard at all  Food Insecurity: No Food Insecurity (09/19/2024)   Hunger Vital Sign    Worried About Running Out of Food in the Last Year: Never true    Ran Out of Food in the Last Year: Never true  Transportation Needs: No Transportation Needs (09/19/2024)   PRAPARE - Administrator, Civil Service (Medical): No    Lack of Transportation (Non-Medical): No  Physical Activity: Inactive (09/19/2024)   Exercise Vital Sign    Days  of Exercise per Week: 0 days    Minutes of Exercise per Session: 0 min  Stress: No Stress Concern Present (09/19/2024)   [employer] of Occupational Health - Occupational Stress Questionnaire    Feeling of Stress: Not at all  Social Connections: Moderately Integrated (09/19/2024)   Social Connection and Isolation Panel    Frequency of Communication with Friends and Family: More than three times a week    Frequency of Social Gatherings with Friends and Family: More than three times a week    Attends Religious Services: More than 4 times per year    Active Member of [employer] or Organizations: No    Attends Banker Meetings: Never    Marital Status: Married    Tobacco Counseling Counseling given: Not Answered    Clinical Intake:  Pre-visit preparation completed: Yes  Pain : No/denies pain  Diabetes: No  Lab Results  Component Value Date   HGBA1C 6.8 (H) 09/16/2024   HGBA1C 6.7 (H) 03/17/2024   HGBA1C 7.1 (H) 09/13/2023     How often do you need to have someone help you when you read instructions, pamphlets, or other  written materials from your doctor or pharmacy?: 1 - Never  Interpreter Needed?: No  Information entered by :: Charmaine Bloodgood LPN   Activities of Daily Living     09/19/2024   10:34 AM 08/10/2024    5:15 PM  In your present state of health, do you have any difficulty performing the following activities:  Hearing? 0 1  Vision? 0 0  Difficulty concentrating or making decisions? 0 0  Walking or climbing stairs? 0   Dressing or bathing? 0   Doing errands, shopping? 0 0  Preparing Food and eating ? N   Using the Toilet? N   In the past six months, have you accidently leaked urine? N   Do you have problems with loss of bowel control? N   Managing your Medications? N   Managing your Finances? N   Housekeeping or managing your Housekeeping? N     Patient Care Team: Cook, Jayce G, DO as PCP - General (Family Medicine) [facility]  I have updated your Care Teams any recent Medical Services you may have received from other providers in the past year.     Assessment:   This is a routine wellness examination for [facility].  Hearing/Vision screen Hearing Screening - Comments:: Patient is able to hear conversational tones without difficulty. No issues reported.   Vision Screening - Comments:: Wears rx glasses - up to date with routine eye exams with Dr. Octavia    Goals Addressed             This Visit's Progress    Maintain health and independent   On track      Depression Screen     09/19/2024   10:39 AM 08/18/2024    2:34 PM 03/21/2024    8:38 AM 09/19/2023    8:31 AM 09/14/2023   11:45 AM 12/20/2022    8:26 AM 08/30/2022   11:27 AM  PHQ 2/9 Scores  PHQ - 2 Score 0  0 0 0 0 0  PHQ- 9 Score    0  0 0  Exception Documentation  Other- indicate reason in comment box       Not completed  did not speak with pt         Fall Risk     09/19/2024   10:36 AM  08/18/2024    2:34 PM 03/21/2024    8:38 AM 03/21/2024    8:37 AM 09/19/2023    8:31 AM  Fall Risk    Falls in the past year? 0 0 0 0 0  Number falls in past yr: 0    0  Injury with Fall? 0    0  Risk for fall due to : No Fall Risks  No Fall Risks    Follow up Falls prevention discussed;Education provided;Falls evaluation completed Falls evaluation completed;Falls prevention discussed Falls evaluation completed      MEDICARE RISK AT HOME:  Medicare Risk at Home Any stairs in or around the home?: No If so, are there any without handrails?: No Home free of loose throw rugs in walkways, pet beds, electrical cords, etc?: Yes Adequate lighting in your home to reduce risk of falls?: Yes Life alert?: No Use of a cane, walker or w/c?: No Grab bars in the bathroom?: Yes Shower chair or bench in shower?: No Elevated toilet seat or a handicapped toilet?: Yes  TIMED UP AND GO:  Was the test performed?  No  Cognitive Function: 6CIT completed        09/19/2024   10:36 AM 09/14/2023   11:45 AM 08/30/2022   11:30 AM 08/02/2021    9:09 AM  6CIT Screen  What Year? 0 points 0 points 0 points 0 points  What month? 0 points 0 points 0 points 0 points  What time? 0 points 0 points 0 points 0 points  Count back from 20 0 points 0 points 0 points 0 points  Months in reverse 0 points 0 points 0 points 0 points  Repeat phrase 0 points 0 points 2 points 0 points  Total Score 0 points 0 points 2 points 0 points    Immunizations Immunization History  Administered Date(s) Administered   Fluad Quad(high Dose 65+) 09/19/2021, 09/21/2022   Fluad Trivalent(High Dose 65+) 09/19/2023   INFLUENZA, HIGH DOSE SEASONAL PF 09/01/2019, 09/08/2024   Influenza,inj,Quad PF,6+ Mos 08/04/2014, 08/04/2016, 08/13/2017, 08/23/2018   Influenza-Unspecified 09/11/2013, 09/10/2015, 09/02/2019, 09/28/2020, 08/20/2021   Moderna Sars-Covid-2 Vaccination 12/13/2019, 01/12/2020, 10/01/2020, 03/11/2021   PNEUMOCOCCAL CONJUGATE-20 06/14/2022   Pneumococcal Conjugate-13 09/18/2012   RSV,unspecified 11/12/2022   Zoster  Recombinant(Shingrix) 07/26/2021, 08/20/2021   Zoster, Live 02/27/2008    Screening Tests Health Maintenance  Topic Date Due   DTaP/Tdap/Td (1 - Tdap) Never done   COVID-19 Vaccine (5 - 2025-26 season) 07/21/2024   FOOT EXAM  09/18/2024   HEMOGLOBIN A1C  03/17/2025   OPHTHALMOLOGY EXAM  05/16/2025   Diabetic kidney evaluation - eGFR measurement  09/16/2025   Diabetic kidney evaluation - Urine ACR  09/16/2025   Medicare Annual Wellness (AWV)  09/19/2025   Pneumococcal Vaccine: 50+ Years  Completed   Influenza Vaccine  Completed   Zoster Vaccines- Shingrix  Completed   Meningococcal B Vaccine  Aged Out    Health Maintenance Items Addressed: Patient is up to date   Additional Screening:  Vision Screening: Recommended annual ophthalmology exams for early detection of glaucoma and other disorders of the eye. Is the patient up to date with their annual eye exam?  Yes  Who is the provider or what is the name of the office in which the patient attends annual eye exams? [facility]   Dental Screening: Recommended annual dental exams for proper oral hygiene  Community Resource Referral / Chronic Care Management: CRR required this visit?  No   CCM required this visit?  No   Plan:    I have personally reviewed and noted the following in the patient's chart:   Medical and social history Use of alcohol , tobacco or illicit drugs  Current medications and supplements including opioid prescriptions. Patient is not currently taking opioid prescriptions. Functional ability and status Nutritional status Physical activity Advanced directives List of other physicians Hospitalizations, surgeries, and ER visits in previous 12 months Vitals Screenings to include cognitive, depression, and falls Referrals and appointments  In addition, I have reviewed and discussed with patient certain preventive protocols, quality metrics, and best practice recommendations. A written personalized  care plan for preventive services as well as general preventive health recommendations were provided to patient.   Lavelle Pfeiffer [place], [place]   [id number]   After Visit Summary: (MyChart) Due to this being a telephonic visit, the after visit summary with patients personalized plan was offered to patient via MyChart   Notes: Nothing significant to report at this time.

## 2024-09-19 NOTE — Patient Instructions (Signed)
 [name],  Thank you for taking the time for your Medicare Wellness Visit. I appreciate your continued commitment to your health goals. Please review the care plan we discussed, and feel free to reach out if I can assist you further.  Medicare recommends these wellness visits once per year to help you and your care team stay ahead of potential health issues. These visits are designed to focus on prevention, allowing your provider to concentrate on managing your acute and chronic conditions during your regular appointments.  Please note that Annual Wellness Visits do not include a physical exam. Some assessments may be limited, especially if the visit was conducted virtually. If needed, we may recommend a separate in-person follow-up with your provider.  Ongoing Care Seeing your primary care provider every 3 to 6 months helps us  monitor your health and provide consistent, personalized care.   Referrals If a referral was made during today's visit and you haven't received any updates within two weeks, please contact the referred provider directly to check on the status.  Recommended Screenings:  Health Maintenance  Topic Date Due   DTaP/Tdap/Td vaccine (1 - Tdap) Never done   COVID-19 Vaccine (5 - 2025-26 season) 07/21/2024   Complete foot exam   09/18/2024   Hemoglobin A1C  03/17/2025   Eye exam for diabetics  05/16/2025   Yearly kidney function blood test for diabetes  09/16/2025   Yearly kidney health urinalysis for diabetes  09/16/2025   Medicare Annual Wellness Visit  09/19/2025   Pneumococcal Vaccine for age over 70  Completed   Flu Shot  Completed   Zoster (Shingles) Vaccine  Completed   Meningitis B Vaccine  Aged Out       09/19/2024   10:36 AM  Advanced Directives  Does Patient Have a Medical Advance Directive? No  Would patient like information on creating a medical advance directive? Yes (MAU/Ambulatory/Procedural Areas - Information given)   Advance Care Planning is  important because it: Ensures you receive medical care that aligns with your values, goals, and preferences. Provides guidance to your family and loved ones, reducing the emotional burden of decision-making during critical moments.  Information on Advanced Care Planning can be found at [place]  [employer] [facility] Advance Health Care Directives Advance Health Care Directives ([email or web address]   Vision: Annual vision screenings are recommended for early detection of glaucoma, cataracts, and diabetic retinopathy. These exams can also reveal signs of chronic conditions such as diabetes and high blood pressure.  Dental: Annual dental screenings help detect early signs of oral cancer, gum disease, and other conditions linked to overall health, including heart disease and diabetes.  Please see the attached documents for additional preventive care recommendations.

## 2024-09-19 NOTE — Progress Notes (Signed)
I agree with the management as indicated in the note.  Peityn Payton DO [facility]  

## 2024-09-22 ENCOUNTER — Ambulatory Visit: Admitting: Family Medicine

## 2024-09-22 VITALS — BP 138/80 | HR 61 | Temp 98.8°F | Ht 68.0 in | Wt 189.5 lb

## 2024-09-22 DIAGNOSIS — E782 Mixed hyperlipidemia: Secondary | ICD-10-CM | POA: Diagnosis not present

## 2024-09-22 DIAGNOSIS — I10 Essential (primary) hypertension: Secondary | ICD-10-CM

## 2024-09-22 DIAGNOSIS — E119 Type 2 diabetes mellitus without complications: Secondary | ICD-10-CM

## 2024-09-22 DIAGNOSIS — E039 Hypothyroidism, unspecified: Secondary | ICD-10-CM

## 2024-09-22 MED ORDER — OLMESARTAN MEDOXOMIL 40 MG PO TABS: 40.0000 mg | ORAL_TABLET | Freq: Every day | ORAL | 3 refills | Status: DC

## 2024-09-22 MED ORDER — ATORVASTATIN CALCIUM 80 MG PO TABS: 80.0000 mg | ORAL_TABLET | Freq: Every day | ORAL | 3 refills | Status: DC

## 2024-09-22 NOTE — Assessment & Plan Note (Signed)
 Stable.  Continue olmesartan .

## 2024-09-22 NOTE — Assessment & Plan Note (Signed)
 Stable

## 2024-09-22 NOTE — Assessment & Plan Note (Signed)
Fairly well controlled.  Continue Lipitor.

## 2024-09-22 NOTE — Patient Instructions (Signed)
 Monitor BP at home.  Follow up in 6 months.  Take care  Dr. Bluford

## 2024-09-22 NOTE — Assessment & Plan Note (Signed)
 Stable.  Continue current dosing of levothyroxine.

## 2024-09-22 NOTE — Progress Notes (Signed)
 Subjective:  Patient ID: Cameron Smith, male    DOB: Oct 24, 1941  Age: 83 y.o. MRN: [id number]  CC:   Chief Complaint  Patient presents with   Follow-up    Patient is here for a 6 month follow up.  Patient mentioned having gallbladder surgery done back in September of this year and has an off and on pain on the left side since his surgery.     HPI:  83 year old male presents for follow-up.  Patient states that he has had some intermittent left flank pain.  He states that it lasts very briefly.  No other associated symptoms.  Blood pressure is well-controlled.  Type 2 diabetes is diet controlled.  Last A1c 6.8.  No proteinuria.  Lipids fairly well-controlled with an LDL of 78.  He is compliant with Lipitor.  Hypothyroidism stable on current dosing of Levothyroxine .  Patient Active Problem List   Diagnosis Date Noted   GERD (gastroesophageal reflux disease) 08/10/2024   Reactive airways dysfunction syndrome (HCC) 08/10/2024   Erectile dysfunction 03/21/2024   Upper airway cough syndrome vs cough variant asthma 03/14/2023   Type 2 diabetes, diet controlled (HCC) 12/15/2021   Essential hypertension 07/08/2013   Mixed hyperlipidemia 07/08/2013   Acquired hypothyroidism 07/08/2013    Social Hx   Social History   Socioeconomic History   Marital status: Married    Spouse name: Not on file   Number of children: Not on file   Years of education: Not on file   Highest education level: Not on file  Occupational History   Not on file  Tobacco Use   Smoking status: Former    Current packs/day: 0.00    Average packs/day: 2.0 packs/day for 26.0 years (52.0 ttl pk-yrs)    Types: Cigarettes    Start date: 12/28/1957    Quit date: 11/29/1983    Years since quitting: 40.8   Smokeless tobacco: Never  Vaping Use   Vaping status: Never Used  Substance and Sexual Activity   Alcohol  use: No   Drug use: No   Sexual activity: Not on file  Other Topics Concern   Not on file  Social  History Narrative   Married x 60 years. 2 children, 4 grands, 4 great grands   Social Drivers of Health   Financial Resource Strain: Low Risk  (09/19/2024)   Overall Financial Resource Strain (CARDIA)    Difficulty of Paying Living Expenses: Not hard at all  Food Insecurity: No Food Insecurity (09/19/2024)   Hunger Vital Sign    Worried About Running Out of Food in the Last Year: Never true    Ran Out of Food in the Last Year: Never true  Transportation Needs: No Transportation Needs (09/19/2024)   PRAPARE - Administrator, Civil Service (Medical): No    Lack of Transportation (Non-Medical): No  Physical Activity: Inactive (09/19/2024)   Exercise Vital Sign    Days of Exercise per Week: 0 days    Minutes of Exercise per Session: 0 min  Stress: No Stress Concern Present (09/19/2024)   [employer] of Occupational Health - Occupational Stress Questionnaire    Feeling of Stress: Not at all  Social Connections: Moderately Integrated (09/19/2024)   Social Connection and Isolation Panel    Frequency of Communication with Friends and Family: More than three times a week    Frequency of Social Gatherings with Friends and Family: More than three times a week    Attends Religious Services: More than  4 times per year    Active Member of Clubs or Organizations: No    Attends Banker Meetings: Never    Marital Status: Married    Review of Systems Per HPI  Objective:  BP 138/80 (BP Location: Right Arm, Cuff Size: Normal)   Pulse 61   Temp 98.8 F (37.1 C)   Ht 5' 8 (1.727 m)   Wt 189 lb 8 oz (86 kg)   BMI 28.81 kg/m      09/22/2024    9:48 AM 09/22/2024    9:24 AM 09/19/2024   10:33 AM  BP/Weight  Systolic BP 138 175 --  Diastolic BP 80 74 --  Wt. (Lbs)  189.5 184  BMI  28.81 kg/m2 27.98 kg/m2    Physical Exam Vitals and nursing note reviewed.  Constitutional:      General: He is not in acute distress.    Appearance: Normal appearance.   HENT:     Head: Normocephalic and atraumatic.  Eyes:     General:        Right eye: No discharge.        Left eye: No discharge.     Conjunctiva/sclera: Conjunctivae normal.  Cardiovascular:     Rate and Rhythm: Normal rate and regular rhythm.  Pulmonary:     Effort: Pulmonary effort is normal.     Breath sounds: Normal breath sounds. No wheezing, rhonchi or rales.  Abdominal:     General: There is no distension.     Palpations: Abdomen is soft.     Tenderness: There is no abdominal tenderness.  Neurological:     Mental Status: He is alert.  Psychiatric:        Mood and Affect: Mood normal.        Behavior: Behavior normal.     Lab Results  Component Value Date   WBC 7.0 09/16/2024   HGB 14.8 09/16/2024   HCT 45.5 09/16/2024   PLT 182 09/16/2024   GLUCOSE 90 09/16/2024   CHOL 139 09/16/2024   TRIG 99 09/16/2024   HDL 43 09/16/2024   LDLCALC 78 09/16/2024   ALT 20 09/16/2024   AST 18 09/16/2024   NA 144 09/16/2024   K 4.8 09/16/2024   CL 106 09/16/2024   CREATININE 0.91 09/16/2024   BUN 13 09/16/2024   CO2 23 09/16/2024   TSH 1.040 09/16/2024   PSA 0.44 01/15/2015   INR 0.92 12/20/2009   HGBA1C 6.8 (H) 09/16/2024     Assessment & Plan:  Essential hypertension Assessment & Plan: Stable.  Continue olmesartan .  Orders: -     Olmesartan  Medoxomil; Take 1 tablet (40 mg total) by mouth daily.  Dispense: 100 tablet; Refill: 3  Type 2 diabetes, diet controlled (HCC) Assessment & Plan: Stable.   Mixed hyperlipidemia Assessment & Plan: Fairly well-controlled.  Continue Lipitor.  Orders: -     Atorvastatin  Calcium ; Take 1 tablet (80 mg total) by mouth daily.  Dispense: 100 tablet; Refill: 3  Acquired hypothyroidism Assessment & Plan: Stable.  Continue current dosing of levothyroxine .     Follow-up: 6 months  Kiora Hallberg Bluford DO [facility]

## 2024-09-29 ENCOUNTER — Other Ambulatory Visit: Payer: Self-pay | Admitting: Internal Medicine

## 2024-09-29 ENCOUNTER — Other Ambulatory Visit: Payer: Self-pay | Admitting: Family Medicine

## 2024-09-29 DIAGNOSIS — I10 Essential (primary) hypertension: Secondary | ICD-10-CM

## 2024-09-29 DIAGNOSIS — E782 Mixed hyperlipidemia: Secondary | ICD-10-CM

## 2024-09-30 ENCOUNTER — Telehealth: Payer: Self-pay | Admitting: *Deleted

## 2024-09-30 NOTE — Telephone Encounter (Signed)
 Copied from CRM [id number]. Topic: Clinical - Medical Advice >> Sep 29, 2024  2:19 PM Victoria A wrote: Reason for CRM: Patient would like a call back for information diabetes please call patient at [phone] (M) wants to know if he is diagnosed with Diabetes or Coronoy Artery Disease

## 2024-10-07 NOTE — Telephone Encounter (Signed)
 Reviewed DPR, left detailed message on cell # VM box.

## 2024-10-07 NOTE — Telephone Encounter (Signed)
 Cook, Jayce G, DO     09/30/24 10:21 AM Yes, he has diabetes. Last A1c 6.8. It is controlled without medicatio

## 2024-11-10 ENCOUNTER — Other Ambulatory Visit: Payer: Self-pay | Admitting: Family Medicine

## 2024-11-10 DIAGNOSIS — I10 Essential (primary) hypertension: Secondary | ICD-10-CM

## 2024-11-10 MED ORDER — OLMESARTAN MEDOXOMIL 40 MG PO TABS: 40.0000 mg | ORAL_TABLET | Freq: Every day | ORAL | 3 refills | Status: AC

## 2025-03-23 ENCOUNTER — Ambulatory Visit: Admitting: Family Medicine

## 2025-10-02 ENCOUNTER — Ambulatory Visit
